# Patient Record
Sex: Male | Born: 1955 | Race: White | Hispanic: No | Marital: Married | Smoking: Former smoker
Health system: Southern US, Community
[De-identification: ages and names within clinical notes are randomized; demographics above are authoritative.]

## PROBLEM LIST (undated history)

## (undated) DIAGNOSIS — F329 Major depressive disorder, single episode, unspecified: Secondary | ICD-10-CM

## (undated) DIAGNOSIS — K922 Gastrointestinal hemorrhage, unspecified: Secondary | ICD-10-CM

## (undated) DIAGNOSIS — Z9289 Personal history of other medical treatment: Secondary | ICD-10-CM

## (undated) DIAGNOSIS — D649 Anemia, unspecified: Secondary | ICD-10-CM

## (undated) DIAGNOSIS — F32A Depression, unspecified: Secondary | ICD-10-CM

## (undated) DIAGNOSIS — E1340 Other specified diabetes mellitus with diabetic neuropathy, unspecified: Secondary | ICD-10-CM

## (undated) DIAGNOSIS — R0600 Dyspnea, unspecified: Secondary | ICD-10-CM

## (undated) DIAGNOSIS — I2510 Atherosclerotic heart disease of native coronary artery without angina pectoris: Secondary | ICD-10-CM

## (undated) DIAGNOSIS — M51369 Other intervertebral disc degeneration, lumbar region without mention of lumbar back pain or lower extremity pain: Secondary | ICD-10-CM

## (undated) DIAGNOSIS — I10 Essential (primary) hypertension: Secondary | ICD-10-CM

## (undated) DIAGNOSIS — Z860101 Personal history of adenomatous and serrated colon polyps: Secondary | ICD-10-CM

## (undated) DIAGNOSIS — K219 Gastro-esophageal reflux disease without esophagitis: Secondary | ICD-10-CM

## (undated) DIAGNOSIS — E782 Mixed hyperlipidemia: Secondary | ICD-10-CM

## (undated) DIAGNOSIS — E119 Type 2 diabetes mellitus without complications: Secondary | ICD-10-CM

## (undated) DIAGNOSIS — G4730 Sleep apnea, unspecified: Secondary | ICD-10-CM

## (undated) DIAGNOSIS — M48062 Spinal stenosis, lumbar region with neurogenic claudication: Secondary | ICD-10-CM

## (undated) DIAGNOSIS — M5136 Other intervertebral disc degeneration, lumbar region: Secondary | ICD-10-CM

## (undated) DIAGNOSIS — M1990 Unspecified osteoarthritis, unspecified site: Secondary | ICD-10-CM

## (undated) DIAGNOSIS — I493 Ventricular premature depolarization: Secondary | ICD-10-CM

## (undated) DIAGNOSIS — C801 Malignant (primary) neoplasm, unspecified: Secondary | ICD-10-CM

## (undated) DIAGNOSIS — F419 Anxiety disorder, unspecified: Secondary | ICD-10-CM

## (undated) HISTORY — PX: EYE SURGERY: SHX253

## (undated) HISTORY — PX: COLON SURGERY: SHX602

## (undated) HISTORY — PX: BILATERAL CARPAL TUNNEL RELEASE: SHX6508

## (undated) HISTORY — PX: BACK SURGERY: SHX140

---
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
## 1994-10-31 HISTORY — PX: CORONARY ARTERY BYPASS GRAFT: SHX141

## 1994-10-31 HISTORY — PX: BYPASS GRAFT: SHX909

## 1998-10-31 HISTORY — PX: OTHER SURGICAL HISTORY: SHX169

## 1999-03-31 ENCOUNTER — Encounter: Admission: RE | Admit: 1999-03-31 | Discharge: 1999-06-29 | Payer: Self-pay | Admitting: Oncology

## 2004-07-10 ENCOUNTER — Other Ambulatory Visit: Payer: Self-pay

## 2004-10-31 HISTORY — PX: OTHER SURGICAL HISTORY: SHX169

## 2004-10-31 HISTORY — PX: FRACTURE SURGERY: SHX138

## 2004-11-30 ENCOUNTER — Ambulatory Visit: Payer: Self-pay | Admitting: Gastroenterology

## 2006-09-27 ENCOUNTER — Ambulatory Visit: Payer: Self-pay | Admitting: Internal Medicine

## 2008-12-28 ENCOUNTER — Emergency Department: Payer: Self-pay | Admitting: Emergency Medicine

## 2010-02-13 ENCOUNTER — Emergency Department: Payer: Self-pay | Admitting: Emergency Medicine

## 2010-11-07 ENCOUNTER — Inpatient Hospital Stay: Payer: Self-pay | Admitting: Internal Medicine

## 2010-11-16 ENCOUNTER — Observation Stay: Payer: Self-pay | Admitting: Internal Medicine

## 2010-12-18 ENCOUNTER — Observation Stay: Payer: Self-pay

## 2012-10-31 HISTORY — PX: CORONARY ANGIOPLASTY WITH STENT PLACEMENT: SHX49

## 2013-03-29 ENCOUNTER — Inpatient Hospital Stay: Payer: Self-pay | Admitting: Internal Medicine

## 2013-03-29 LAB — PROTIME-INR: Prothrombin Time: 13.4 secs (ref 11.5–14.7)

## 2013-03-29 LAB — CBC
HCT: 40.6 % (ref 40.0–52.0)
MCH: 30.8 pg (ref 26.0–34.0)
MCHC: 35.3 g/dL (ref 32.0–36.0)
Platelet: 111 10*3/uL — ABNORMAL LOW (ref 150–440)
RBC: 4.65 10*6/uL (ref 4.40–5.90)
RDW: 13.1 % (ref 11.5–14.5)
WBC: 8.5 10*3/uL (ref 3.8–10.6)

## 2013-03-29 LAB — BASIC METABOLIC PANEL
Anion Gap: 5 — ABNORMAL LOW (ref 7–16)
Calcium, Total: 8.9 mg/dL (ref 8.5–10.1)
Chloride: 101 mmol/L (ref 98–107)
Creatinine: 0.78 mg/dL (ref 0.60–1.30)
EGFR (Non-African Amer.): >60
Osmolality: 272 (ref 275–301)

## 2013-03-29 LAB — APTT: Activated PTT: 34.9 secs (ref 23.6–35.9)

## 2013-03-29 LAB — CK TOTAL AND CKMB (NOT AT ARMC): CK-MB: 1.9 ng/mL (ref 0.5–3.6)

## 2013-03-30 LAB — TROPONIN I
Troponin-I: 1.2 ng/mL — ABNORMAL HIGH
Troponin-I: 1.24 ng/mL — ABNORMAL HIGH

## 2013-03-30 LAB — MAGNESIUM: Magnesium: 1.8 mg/dL

## 2013-03-30 LAB — LIPID PANEL
Cholesterol: 124 mg/dL (ref 0–200)
Ldl Cholesterol, Calc: 60 mg/dL (ref 0–100)
VLDL Cholesterol, Calc: 23 mg/dL (ref 5–40)

## 2013-03-30 LAB — CK TOTAL AND CKMB (NOT AT ARMC): CK, Total: 213 U/L (ref 35–232)

## 2013-03-30 LAB — APTT
Activated PTT: 146.1 secs — ABNORMAL HIGH (ref 23.6–35.9)
Activated PTT: 90.1 secs — ABNORMAL HIGH (ref 23.6–35.9)

## 2013-03-31 LAB — APTT: Activated PTT: 98.6 secs — ABNORMAL HIGH (ref 23.6–35.9)

## 2013-04-01 LAB — PLATELET COUNT: Platelet: 107 10*3/uL — ABNORMAL LOW (ref 150–440)

## 2013-04-02 LAB — BASIC METABOLIC PANEL
Anion Gap: 7 (ref 7–16)
BUN: 7 mg/dL (ref 7–18)
Calcium, Total: 8.9 mg/dL (ref 8.5–10.1)
Chloride: 104 mmol/L (ref 98–107)
EGFR (African American): >60
EGFR (Non-African Amer.): >60
Osmolality: 274 (ref 275–301)
Potassium: 3.6 mmol/L (ref 3.5–5.1)
Sodium: 138 mmol/L (ref 136–145)

## 2013-04-24 ENCOUNTER — Encounter: Payer: Self-pay | Admitting: Internal Medicine

## 2013-04-30 ENCOUNTER — Encounter: Payer: Self-pay | Admitting: Internal Medicine

## 2013-05-06 ENCOUNTER — Encounter: Payer: Self-pay | Admitting: Rheumatology

## 2013-05-31 ENCOUNTER — Encounter: Payer: Self-pay | Admitting: Internal Medicine

## 2013-07-01 ENCOUNTER — Encounter: Payer: Self-pay | Admitting: Internal Medicine

## 2014-01-27 ENCOUNTER — Ambulatory Visit: Payer: Self-pay | Admitting: Physical Medicine and Rehabilitation

## 2015-02-20 NOTE — Discharge Summary (Signed)
PATIENT NAME:  Kent [name], Kent [name] MR#:  [id number] DATE OF BIRTH:  02-27-1956  DATE OF ADMISSION:  03/29/2013 DATE OF DISCHARGE:    DISCHARGE DIAGNOSES:  1.  Non-ST elevated myocardial infarction.  2.  Diabetes mellitus, non-insulin-requiring.  3.  Hypertension.  4.  Lumbar disk disease.   DISCHARGE MEDICATIONS: Prednisone 5 mg every other day, simvastatin 40 mg at bedtime, multivitamin daily, metoprolol tartrate 50 mg half tab b.i.d., Xanax 0.5 mg 1/2 tablet t.i.d., aspirin 81 mg daily, ferrous sulfate 325 mg b.i.d., Victoza 1.8 mg daily, lisinopril 20 mg daily, metformin 500 mg b.i.d., omeprazole 20 mg daily, Paxil 20 mg daily and Percocet 5/325 mg half tablet at bedtime.   REASON FOR ADMISSION: A 59 year old male presents with chest pain. Please see H and P for HPI, past medical history and physical exam.   HOSPITAL COURSE: The patient was admitted. His troponins peaked at 1.3 with elevated MB fractions and a peak CK of 447. He became pain free with aspirin and morphine in the ED and then had subsequent pain about 18 hours later. He was pain free post PCI. Echocardiogram showed LVF of 45% to 50% with moderate concentric LVH and mild MR. heart catheterization showed mild LV systolic dysfunction with a 95% mid circumflex lesion that underwent PCI to 0% stenosis using a Xience drug-eluting stent, 3.5 x 18 mm. The patient was pain-free post procedure. His groin was normal without hematoma and will be discharged on the above medications. Follow up with Dr. Saralyn Pilar in 1 week and myself in 2 weeks. He will need cardiac rehab. He will be released back to work up by Dr. Saralyn Pilar in 1 week. Overall prognosis is good.  ____________________________ Rusty Aus, MD mfm:aw D: 04/02/2013 08:15:26 ET T: 04/02/2013 08:45:50 ET JOB#: [id number]  cc: Rusty Aus, MD, <Dictator> Rusty Aus MD ELECTRONICALLY SIGNED 04/03/2013 8:14

## 2015-02-20 NOTE — Consult Note (Signed)
PATIENT NAME:  Kent [name], Kent [name] MR#:  [id number] DATE OF BIRTH:  04-12-1956  DATE OF CONSULTATION:  03/29/2013  CONSULTING PHYSICIAN:  Isaias Cowman, MD  PRIMARY CARE PHYSICIAN: Dr. Sabra Heck  PRIMARY CARDIOLOGIST: Dr. Ubaldo Glassing  CHIEF COMPLAINT: Chest pain.   HISTORY OF PRESENT ILLNESS: The patient is a 59 year old gentleman referred for evaluation of chest pain, EKG changes and elevated troponin. The patient is status post coronary artery bypass graft surgery at [facility] in 1996 and underwent stent of his right coronary artery in 2012. The patient reports that he has been in his usual state of health until recently when he has noted some intermittent chest discomfort when mowing the yard. On the day of admission on 03/29/2013, the patient walked to his mailbox and on his return to the house experienced substernal chest pain. The patient took antacids and sublingual nitroglycerin without relief. He describes his chest pain as 6/10. The patient presented to [facility] Emergency Room, where EKG revealed nondiagnostic ST elevation in lead III with ST depression in the lateral leads. The patient was admitted to telemetry floor, where initial troponin was less than 0.02 with a followup troponin of 1.20. The patient reports he still has some mild chest discomfort rated 1 to 2/10.   PAST MEDICAL HISTORY: 1.  Coronary artery disease as described above, status post coronary artery bypass graft surgery x 1 in 1996 at [facility], with drug-eluting stent to mid right coronary artery in 12/2010.  2.  Hypertension.  3.  Obesity.  4.  History of anxiety and panic attacks.  5.  History of reflux esophagitis and stricture.   MEDICATIONS: Aspirin 81 mg daily, Plavix 75 mg q.a.m., lisinopril 10 mg daily, simvastatin 40 mg at bedtime, Xanax 0.25 mg 1/2 tablet t.i.d., iron tablets b.i.d., Victoza 1.8 subcutaneous injection daily, metformin 500 mg b.i.d., omeprazole 20 mg daily, Paxil 20 mg daily, Percocet  p.r.n., prednisone 5 mg every other day, Tylenol Arthritis 650 q.4 p.r.n., multivitamin 1 daily, ibuprofen 400 mg daily.   SOCIAL HISTORY: The patient is married. He is a Art gallery manager by occupation. He quit smoking in 1996 right before he underwent bypass graft surgery.   FAMILY HISTORY: No immediate family history of coronary disease or myocardial infarction.   REVIEW OF SYSTEMS:    CONSTITUTIONAL: No fever or chills.  EYES: No blurry vision.  EARS: No hearing loss.  RESPIRATORY: No shortness of breath.  CARDIOVASCULAR: Chest pain as described above.  GASTROINTESTINAL: No nausea, vomiting, diarrhea or constipation.  GENITOURINARY: No dysuria or hematuria.  ENDOCRINE: No polyuria or polydipsia.  INTEGUMENTARY: No rash.  ENDOCRINE: No polyuria or polydipsia.  MUSCULOSKELETAL: No arthralgias or myalgias.  NEUROLOGICAL: No focal muscle weakness or numbness.  PSYCHOLOGICAL: The patient does have a history of anxiety and panic disorder.   PHYSICAL EXAMINATION: VITAL SIGNS: Blood pressure 106/66, pulse 72, respirations 18, temperature 97.8, pulse oximetry 94%.  HEENT: Pupils equal and reactive to light and accommodation.  NECK: Supple without thyromegaly.  LUNGS: Clear.  HEART: Normal JVP. Normal PMI. Regular rate and rhythm. Normal S1 and S2. No gallop, murmur or rub.  ABDOMEN: Soft and nontender. Pulses were intact bilaterally.  MUSCULOSKELETAL: Normal muscle tone.  NEUROLOGIC: The patient is alert and oriented x 3. Motor and sensory both grossly intact.   IMPRESSION: This is a 59 year old gentleman with known coronary artery disease as described above who presents with chest pain. EKG initially showed nondiagnostic ST elevation in lead III with lateral ST depression, with  initial negative troponin. Followup troponin was borderline elevated. The patient still has some mild chest pain.   RECOMMENDATIONS: 1.  Agree with overall current therapy.  2.  Recheck EKG. 3.  Recheck troponin. 4.   Pending EKG and troponin results, may consider transfer today for early invasive cardiac evaluation with cardiac catheterization.  5.  A 2-D echocardiogram to evaluate left ventricular function.  6.  The patient will likely require cardiac catheterization during this hospitalization. The risks, benefits and alternatives were explained and informed written consent obtained.   ____________________________ Isaias Cowman, MD ap:jm D: 03/30/2013 09:36:40 ET T: 03/30/2013 15:02:04 ET JOB#: [id number]  cc: Isaias Cowman, MD, <Dictator> Isaias Cowman MD ELECTRONICALLY SIGNED 04/08/2013 8:48

## 2015-02-20 NOTE — H&P (Signed)
PATIENT NAME:  Kent [name], Kent [name] MR#:  [id number] DATE OF BIRTH:  01-Jan-1956  DATE OF ADMISSION:  03/29/2013  PRIMARY CARE PHYSICIAN:  Dr. Emily Filbert.   CARDIOLOGIST:  Dr. Ubaldo Glassing.   CHIEF COMPLAINT:  Chest pain today.   HISTORY OF PRESENT ILLNESS:  The patient Kent [name] is a 59 year old pleasant Caucasian gentleman with history of coronary artery disease status post CABG in 1996 and status post stent of his mid RCA with a drug-eluting stent in 2012, history of reflux esophagitis and chronic arthritis, comes to the Emergency Room with complaints of increasing chest pain, pressure, along with radiation to the jaw and right arm after he got back from work.  He did have some shortness of breath, comes to the Emergency Room, received aspirin and nitro paste.  He still rates his pain 5 over 10.  His first set of cardiac enzymes is negative, however his EKG shows some ST depression in lateral leads, which is new from EKG of July 2012.  The patient is being admitted for non-STEMI for further evaluation and management.   PAST MEDICAL HISTORY: 1.  Coronary artery disease status post CABG in 1996 x 1.  The patient underwent placement of drug-eluting stent to mid RCA in February of 2012.  2.  History of panic/anxiety attacks.  3.  Iron deficiency anemia.  4.  Reflux esophagitis with stricture.  5.  History of colon polyps.  6.  Hypertension.  7.  Morbid obesity.  8.  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   PAST SURGICAL HISTORY:  1.  Coronary artery disease August 1996.  2.  Right carpal tunnel surgery.  3.  Bilateral leg fractures, titanium rods placed in May 2006.   ALLERGIES:  CELEBREX, VIOXX AND ZOLOFT.   MEDICATIONS: 1.  Xanax 0.25 1/2 tablet 3 times a day.  2.  Aspirin 81 mg at bedtime.  3.  Iron tablets daily, twice daily.  4.  Victoza 1.8 mg subQ injection daily.  5.  Lisinopril 20 mg daily.  6.  Metformin 500 mg twice daily.  7.  Omeprazole 20 mg daily.  8.   Paxil 20 mg daily.  9.  Percocet 5/325 as needed.  10.  Plavix 75 mg daily in the morning.  11.  Prednisone 5 mg every other day.  12.  Simvastatin 40 mg daily at bedtime.  13.  Tylenol arthritis 650 1 tablet every 4 hours as needed.  14.  Multivitamin daily.  15.  Ibuprofen 400 mg by mouth daily.   SOCIAL HISTORY:  Heavy smoker prior to 66.  He is a Art gallery manager by occupation.   REVIEW OF SYSTEMS:   EARS, NOSE, THROAT:  No tinnitus, ear pain, hearing loss.  EYES:  No blurred, double vision or inflammation or glaucoma.  CONSTITUTIONAL:  No fever, fatigue, weakness.  RESPIRATORY:  No cough, wheeze, hemoptysis, COPD.  CARDIOVASCULAR:  Positive for chest pain, hypertension and mild shortness of breath.  GASTROINTESTINAL:  No nausea, vomiting, diarrhea, abdominal pain or GERD.  GENITOURINARY:  No dysuria, hematuria.  ENDOCRINE:  No polyuria, nocturia or thyroid problems.  HEMATOLOGY:  Positive for chronic anemia.  SKIN:  No acne or rash.  MUSCULOSKELETAL:  Positive for chronic arthritis.  NEUROLOGIC:  Positive for peripheral neuropathy bilateral lower extremity.  No numbness, weakness or CVA.  PSYCHIATRIC:  No anxiety or depression.   LABORATORY DATA:  CBC within normal limits except platelet count of 111, glucose is 111, BUN is 11, creatinine  0.78, sodium is 136, potassium 3.7.  Troponin is 0.02.  CK total and MB within normal limits.  PT INR is 13.4 and 1.0.  EKG shows a normal sinus rhythm with short PR interval.  Marked ST abnormality with possible lateral subendocardial injury.   ASSESSMENT AND PLAN:  A 59 year old [name] with history of coronary artery disease status post coronary artery bypass graft, hypertension, comes in with:  1.  Acute non-Q-wave myocardial infarction.  The patient came in with chest pain with radiation to jaw, right arm and ST-T depression in lateral leads which is a new change from July 2012 EKG, normal cardiac enzymes.  We will admit the patient to telemetry  floor.  Regular cardiac diet, IV heparin drip, per nomogram and nitro paste along with aspirin, beta-blockers, Plavix and ACE inhibitors.  Cardiology, Dr. Saralyn Pilar to see patient.  He is aware of patient being admitted.  We will cycle cardiac enzymes x 3, check lipid profile.   2.  Coronary artery disease, status post CABG in the past.  3.  Chronic, arthritis.  We will continue the patient's chronic prednisone.  4.  Hyperlipidemia, by mouth simvastatin will be continued.  5.  Deep vein thrombosis prophylaxis.  The patient on heparin drip.   Further work-up according to patient's clinical course.  Hospital admission plan was discussed with the patient.    The patient is a FULL CODE.   TIME SPENT:  60 minutes.     ____________________________ Hart Rochester Posey Pronto, MD sap:ea D: 03/29/2013 21:11:53 ET T: 03/29/2013 22:04:55 ET JOB#: [id number]  cc: Shervin Cypert A. Posey Pronto, MD, <Dictator> Rusty Aus, MD Javier Docker. Ubaldo Glassing, MD Ilda Basset MD ELECTRONICALLY SIGNED 04/04/2013 19:39

## 2015-02-25 ENCOUNTER — Ambulatory Visit
Admit: 2015-02-25 | Disposition: A | Discharge status: Home / Self Care | Payer: Self-pay | Attending: Physical Medicine and Rehabilitation | Admitting: Physical Medicine and Rehabilitation

## 2017-12-05 ENCOUNTER — Other Ambulatory Visit: Payer: Self-pay

## 2017-12-05 ENCOUNTER — Observation Stay
Admission: RE | Admit: 2017-12-05 | Discharge: 2017-12-06 | Disposition: A | Discharge status: Home / Self Care | Payer: BLUE CROSS/BLUE SHIELD | Source: Ambulatory Visit | Attending: Cardiology | Admitting: Cardiology

## 2017-12-05 ENCOUNTER — Encounter
Admission: RE | Disposition: A | Discharge status: Home / Self Care | Payer: Self-pay | Source: Ambulatory Visit | Attending: Cardiology

## 2017-12-05 DIAGNOSIS — Z955 Presence of coronary angioplasty implant and graft: Secondary | ICD-10-CM | POA: Insufficient documentation

## 2017-12-05 DIAGNOSIS — Z7984 Long term (current) use of oral hypoglycemic drugs: Secondary | ICD-10-CM | POA: Insufficient documentation

## 2017-12-05 DIAGNOSIS — E785 Hyperlipidemia, unspecified: Secondary | ICD-10-CM | POA: Insufficient documentation

## 2017-12-05 DIAGNOSIS — I10 Essential (primary) hypertension: Secondary | ICD-10-CM | POA: Diagnosis not present

## 2017-12-05 DIAGNOSIS — I25810 Atherosclerosis of coronary artery bypass graft(s) without angina pectoris: Secondary | ICD-10-CM | POA: Diagnosis not present

## 2017-12-05 DIAGNOSIS — I2582 Chronic total occlusion of coronary artery: Secondary | ICD-10-CM | POA: Diagnosis not present

## 2017-12-05 DIAGNOSIS — F419 Anxiety disorder, unspecified: Secondary | ICD-10-CM | POA: Insufficient documentation

## 2017-12-05 DIAGNOSIS — Z7982 Long term (current) use of aspirin: Secondary | ICD-10-CM | POA: Insufficient documentation

## 2017-12-05 DIAGNOSIS — E119 Type 2 diabetes mellitus without complications: Secondary | ICD-10-CM | POA: Insufficient documentation

## 2017-12-05 DIAGNOSIS — I2510 Atherosclerotic heart disease of native coronary artery without angina pectoris: Secondary | ICD-10-CM | POA: Diagnosis present

## 2017-12-05 DIAGNOSIS — R079 Chest pain, unspecified: Secondary | ICD-10-CM

## 2017-12-05 DIAGNOSIS — Z7902 Long term (current) use of antithrombotics/antiplatelets: Secondary | ICD-10-CM | POA: Diagnosis not present

## 2017-12-05 HISTORY — DX: Type 2 diabetes mellitus without complications: E11.9

## 2017-12-05 HISTORY — DX: Unspecified osteoarthritis, unspecified site: M19.90

## 2017-12-05 HISTORY — DX: Essential (primary) hypertension: I10

## 2017-12-05 HISTORY — PX: LEFT HEART CATH AND CORS/GRAFTS ANGIOGRAPHY: CATH118250

## 2017-12-05 HISTORY — DX: Other specified diabetes mellitus with diabetic neuropathy, unspecified: E13.40

## 2017-12-05 HISTORY — DX: Anemia, unspecified: D64.9

## 2017-12-05 HISTORY — PX: CORONARY STENT INTERVENTION: CATH118234

## 2017-12-05 HISTORY — DX: Atherosclerotic heart disease of native coronary artery without angina pectoris: I25.10

## 2017-12-05 LAB — GLUCOSE, CAPILLARY
GLUCOSE-CAPILLARY: 132 mg/dL — AB (ref 65–99)
GLUCOSE-CAPILLARY: 125 mg/dL — AB (ref 65–99)

## 2017-12-05 LAB — POCT ACTIVATED CLOTTING TIME: ACTIVATED CLOTTING TIME: 373 s

## 2017-12-05 SURGERY — LEFT HEART CATH AND CORS/GRAFTS ANGIOGRAPHY
Anesthesia: Moderate Sedation

## 2017-12-05 MED ORDER — HYDRALAZINE HCL 20 MG/ML IJ SOLN
INTRAMUSCULAR | Status: AC
  Filled 2017-12-05: qty 1

## 2017-12-05 MED ORDER — MIDAZOLAM HCL 2 MG/2ML IJ SOLN
INTRAMUSCULAR | Status: DC | PRN
Start: 2017-12-05 — End: 2017-12-05
  Administered 2017-12-05: 1 mg via INTRAVENOUS

## 2017-12-05 MED ORDER — SODIUM CHLORIDE 0.9% FLUSH: 3.0000 mL | INTRAVENOUS | Status: DC | PRN

## 2017-12-05 MED ORDER — TIROFIBAN HCL IN NACL 5-0.9 MG/100ML-% IV SOLN
INTRAVENOUS | Status: AC
  Filled 2017-12-05: qty 100

## 2017-12-05 MED ORDER — OXYMETAZOLINE HCL 0.05 % NA SOLN
1.0000 | Freq: Two times a day (BID) | NASAL | Status: DC | PRN
  Administered 2017-12-06: 1 via NASAL
  Filled 2017-12-05: qty 15

## 2017-12-05 MED ORDER — ASPIRIN 81 MG PO CHEW
CHEWABLE_TABLET | ORAL | Status: DC | PRN
Start: 2017-12-05 — End: 2017-12-05
  Administered 2017-12-05: 324 mg via ORAL

## 2017-12-05 MED ORDER — MIDAZOLAM HCL 2 MG/2ML IJ SOLN
INTRAMUSCULAR | Status: AC
  Filled 2017-12-05: qty 2

## 2017-12-05 MED ORDER — HYDROCOD POLST-CPM POLST ER 10-8 MG/5ML PO SUER
ORAL | Status: AC
  Filled 2017-12-05: qty 5

## 2017-12-05 MED ORDER — SODIUM CHLORIDE 0.9% FLUSH: 3.0000 mL | Freq: Two times a day (BID) | INTRAVENOUS | Status: DC

## 2017-12-05 MED ORDER — LABETALOL HCL 5 MG/ML IV SOLN: 10.0000 mg | INTRAVENOUS | Status: AC | PRN

## 2017-12-05 MED ORDER — HYDRALAZINE HCL 20 MG/ML IJ SOLN
5.0000 mg | INTRAMUSCULAR | Status: AC | PRN
  Administered 2017-12-05 (×2): 5 mg via INTRAVENOUS

## 2017-12-05 MED ORDER — TIROFIBAN HCL IV 12.5 MG/250 ML
INTRAVENOUS | Status: AC
  Filled 2017-12-05: qty 250

## 2017-12-05 MED ORDER — MAGNESIUM GLUCONATE 500 MG PO TABS
500.0000 mg | ORAL_TABLET | Freq: Every day | ORAL | Status: DC
  Filled 2017-12-05 (×2): qty 1

## 2017-12-05 MED ORDER — LISINOPRIL 20 MG PO TABS: 20.0000 mg | ORAL_TABLET | Freq: Every day | ORAL | Status: DC

## 2017-12-05 MED ORDER — BIVALIRUDIN BOLUS VIA INFUSION - CUPID
INTRAVENOUS | Status: DC | PRN
Start: 2017-12-05 — End: 2017-12-05
  Administered 2017-12-05: 83.025 mg via INTRAVENOUS

## 2017-12-05 MED ORDER — ISOSORBIDE MONONITRATE ER 30 MG PO TB24
30.0000 mg | ORAL_TABLET | Freq: Every day | ORAL | Status: DC
  Administered 2017-12-05: 30 mg via ORAL
  Filled 2017-12-05: qty 1

## 2017-12-05 MED ORDER — FENTANYL CITRATE (PF) 100 MCG/2ML IJ SOLN
INTRAMUSCULAR | Status: AC
  Filled 2017-12-05: qty 2

## 2017-12-05 MED ORDER — CLOPIDOGREL BISULFATE 75 MG PO TABS
ORAL_TABLET | ORAL | Status: DC | PRN
  Administered 2017-12-05: 225 mg via ORAL

## 2017-12-05 MED ORDER — SODIUM CHLORIDE 0.9 % IV SOLN: 250.0000 mL | INTRAVENOUS | Status: DC | PRN

## 2017-12-05 MED ORDER — NITROGLYCERIN 1 MG/10 ML FOR IR/CATH LAB
INTRA_ARTERIAL | Status: DC | PRN
  Administered 2017-12-05: 200 ug via INTRACORONARY

## 2017-12-05 MED ORDER — SODIUM CHLORIDE 0.9 % WEIGHT BASED INFUSION
3.0000 mL/kg/h | INTRAVENOUS | Status: DC
  Administered 2017-12-05: 07:00:00 via INTRAVENOUS

## 2017-12-05 MED ORDER — CLOPIDOGREL BISULFATE 75 MG PO TABS
ORAL_TABLET | ORAL | Status: AC
  Filled 2017-12-05: qty 3

## 2017-12-05 MED ORDER — SODIUM CHLORIDE 0.9 % IV SOLN
INTRAVENOUS | Status: AC | PRN
  Administered 2017-12-05 (×2): 1.75 mg/kg/h via INTRAVENOUS

## 2017-12-05 MED ORDER — CLOPIDOGREL BISULFATE 75 MG PO TABS
75.0000 mg | ORAL_TABLET | Freq: Every day | ORAL | Status: DC
  Administered 2017-12-06: 75 mg via ORAL
  Filled 2017-12-05: qty 1

## 2017-12-05 MED ORDER — HYDROCOD POLST-CPM POLST ER 10-8 MG/5ML PO SUER
5.0000 mL | Freq: Four times a day (QID) | ORAL | Status: DC | PRN
  Administered 2017-12-06: 5 mL via ORAL
  Filled 2017-12-05 (×2): qty 5

## 2017-12-05 MED ORDER — TIROFIBAN (AGGRASTAT) BOLUS VIA INFUSION
INTRAVENOUS | Status: DC | PRN
  Administered 2017-12-05: 2767.5 ug via INTRAVENOUS

## 2017-12-05 MED ORDER — SODIUM CHLORIDE 0.9 % WEIGHT BASED INFUSION
1.0000 mL/kg/h | INTRAVENOUS | Status: AC
  Administered 2017-12-05: 1 mL/kg/h via INTRAVENOUS

## 2017-12-05 MED ORDER — TIROFIBAN HCL IN NACL 5-0.9 MG/100ML-% IV SOLN
INTRAVENOUS | Status: AC | PRN
  Administered 2017-12-05: 0.15 ug/kg/min via INTRAVENOUS
  Administered 2017-12-05: 11:00:00 via INTRAVENOUS

## 2017-12-05 MED ORDER — CARVEDILOL 25 MG PO TABS
25.0000 mg | ORAL_TABLET | Freq: Two times a day (BID) | ORAL | Status: DC
  Administered 2017-12-05 – 2017-12-06 (×2): 25 mg via ORAL
  Filled 2017-12-05 (×2): qty 1

## 2017-12-05 MED ORDER — ASPIRIN 81 MG PO CHEW: 81.0000 mg | CHEWABLE_TABLET | Freq: Every day | ORAL | Status: DC

## 2017-12-05 MED ORDER — ACETAMINOPHEN 325 MG PO TABS: 650.0000 mg | ORAL_TABLET | ORAL | Status: DC | PRN

## 2017-12-05 MED ORDER — BIVALIRUDIN TRIFLUOROACETATE 250 MG IV SOLR
INTRAVENOUS | Status: AC
  Filled 2017-12-05: qty 250

## 2017-12-05 MED ORDER — HYDROCOD POLST-CPM POLST ER 10-8 MG/5ML PO SUER
ORAL | Status: DC | PRN
  Administered 2017-12-05: 5 mL via ORAL

## 2017-12-05 MED ORDER — HEPARIN (PORCINE) IN NACL 2-0.9 UNIT/ML-% IJ SOLN
INTRAMUSCULAR | Status: AC
  Filled 2017-12-05: qty 1000

## 2017-12-05 MED ORDER — PAROXETINE HCL 20 MG PO TABS
20.0000 mg | ORAL_TABLET | Freq: Every day | ORAL | Status: DC
  Filled 2017-12-05 (×2): qty 1

## 2017-12-05 MED ORDER — NITROGLYCERIN 5 MG/ML IV SOLN
INTRAVENOUS | Status: AC
  Filled 2017-12-05: qty 10

## 2017-12-05 MED ORDER — ONDANSETRON HCL 4 MG/2ML IJ SOLN: 4.0000 mg | Freq: Four times a day (QID) | INTRAMUSCULAR | Status: DC | PRN

## 2017-12-05 MED ORDER — TIROFIBAN HCL IN NACL 5-0.9 MG/100ML-% IV SOLN
0.1500 ug/kg/min | INTRAVENOUS | Status: AC
  Administered 2017-12-06: 0.15 ug/kg/min via INTRAVENOUS
  Filled 2017-12-05 (×4): qty 100

## 2017-12-05 MED ORDER — ASPIRIN 81 MG PO CHEW
CHEWABLE_TABLET | ORAL | Status: AC
  Filled 2017-12-05: qty 4

## 2017-12-05 MED ORDER — FERROUS SULFATE 325 (65 FE) MG PO TABS
325.0000 mg | ORAL_TABLET | Freq: Two times a day (BID) | ORAL | Status: DC
  Administered 2017-12-05 – 2017-12-06 (×2): 325 mg via ORAL
  Filled 2017-12-05 (×2): qty 1

## 2017-12-05 MED ORDER — ASPIRIN 81 MG PO CHEW: 81.0000 mg | CHEWABLE_TABLET | ORAL | Status: DC

## 2017-12-05 MED ORDER — ALUM & MAG HYDROXIDE-SIMETH 200-200-20 MG/5ML PO SUSP
30.0000 mL | Freq: Four times a day (QID) | ORAL | Status: DC | PRN
  Administered 2017-12-05: 30 mL via ORAL
  Filled 2017-12-05: qty 30

## 2017-12-05 MED ORDER — FENTANYL CITRATE (PF) 100 MCG/2ML IJ SOLN
INTRAMUSCULAR | Status: DC | PRN
  Administered 2017-12-05: 25 ug via INTRAVENOUS

## 2017-12-05 MED ORDER — SIMVASTATIN 20 MG PO TABS
40.0000 mg | ORAL_TABLET | Freq: Every day | ORAL | Status: DC
  Administered 2017-12-05: 40 mg via ORAL
  Filled 2017-12-05: qty 2

## 2017-12-05 MED ORDER — GABAPENTIN 300 MG PO CAPS
300.0000 mg | ORAL_CAPSULE | Freq: Two times a day (BID) | ORAL | Status: DC
  Administered 2017-12-05 (×2): 300 mg via ORAL
  Filled 2017-12-05 (×2): qty 1

## 2017-12-05 MED ORDER — HYDROCODONE-ACETAMINOPHEN 5-325 MG PO TABS
1.0000 | ORAL_TABLET | ORAL | Status: DC | PRN
  Administered 2017-12-05 – 2017-12-06 (×3): 1 via ORAL
  Filled 2017-12-05 (×3): qty 1

## 2017-12-05 MED ORDER — SODIUM CHLORIDE 0.9 % WEIGHT BASED INFUSION: 1.0000 mL/kg/h | INTRAVENOUS | Status: DC

## 2017-12-05 SURGICAL SUPPLY — 23 items
BALLN MINITREK RX 1.5X15 (BALLOONS) ×2
BALLN ~~LOC~~ TREK RX 2.5X12 (BALLOONS)
BALLOON MINITREK RX 1.5X15 (BALLOONS) ×1 IMPLANT
BALLOON ~~LOC~~ TREK RX 2.5X12 (BALLOONS) IMPLANT
CATH 5FR JR4 DIAGNOSTIC (CATHETERS) ×2 IMPLANT
CATH INFINITI 5 FR IM (CATHETERS) ×2 IMPLANT
CATH INFINITI 5FR ANG PIGTAIL (CATHETERS) ×2 IMPLANT
CATH INFINITI 5FR JL4 (CATHETERS) ×2 IMPLANT
CATH INFINITI 5FR JL5 (CATHETERS) ×2 IMPLANT
CATH VISTA GUIDE 6FR XB3.5 (CATHETERS) ×2 IMPLANT
DEVICE CLOSURE MYNXGRIP 6/7F (Vascular Products) ×2 IMPLANT
DEVICE INFLAT 30 PLUS (MISCELLANEOUS) ×2 IMPLANT
DEVICE SAFEGUARD 24CM (GAUZE/BANDAGES/DRESSINGS) ×2 IMPLANT
KIT MANI 3VAL PERCEP (MISCELLANEOUS) ×2 IMPLANT
NEEDLE PERC 18GX7CM (NEEDLE) ×2 IMPLANT
PACK CARDIAC CATH (CUSTOM PROCEDURE TRAY) ×2 IMPLANT
SHEATH AVANTI 5FR X 11CM (SHEATH) ×2 IMPLANT
SHEATH AVANTI 6FR X 11CM (SHEATH) ×2 IMPLANT
STENT SIERRA 2.25 X 18 MM (Permanent Stent) ×2 IMPLANT
STENT SIERRA 2.50 X 12 MM (Permanent Stent) ×2 IMPLANT
WIRE ASAHI PROWATER 180CM (WIRE) ×2 IMPLANT
WIRE EMERALD 3MM-J .035X260CM (WIRE) ×2 IMPLANT
WIRE GUIDERIGHT .035X150 (WIRE) ×2 IMPLANT

## 2017-12-05 NOTE — Progress Notes (Signed)
Pt admitted to unit s/p cardiac catherization with stent placement. Pt has no complaints of pain, VSS, SR with PVCs on telemetry. PAD with 40cc of air is intact. Some oozing noted at right groin site. RN will leave air in PAD for now and continue to assess.  Aggrenox infusing at 19.9

## 2017-12-05 NOTE — Progress Notes (Signed)
In and out cath performed secondary to pt. Unable to urinate laying down. Pt. Tolerated well. Noted scant amt. Of blood around meatus upon Hunt cath. Encouraged pt. To alert RN if any blood or clots noted upon urinating per self later on today. Pt. Verbalized understanding. Right groin remains clean, dry, intact with PAD on.

## 2017-12-05 NOTE — Progress Notes (Signed)
MD notified for order for aggrastat. No orders are entered. MD gives order for aggrastat and is made aware of patient having pink tinged urine. I will continue to assess.

## 2017-12-05 NOTE — Progress Notes (Signed)
Doctor maier ordered Afrin PRN twice a day for congestion. Will continue to monitor

## 2017-12-05 NOTE — Progress Notes (Signed)
Pt attempts to use urinal. Two small blood clots are expelled. Pt proceeds to urinate without any difficulty. No more clots are present and the urine is clear staw colored. I will continue to assess.

## 2017-12-05 NOTE — Progress Notes (Signed)
Pt complains of heartburn/indigestion. Standing order for prn maalox will be entered. I will continue to assess.

## 2017-12-05 NOTE — Progress Notes (Signed)
Pt complains of congestion. Doctor maier was notified awaiting call back. Will continue to monitor.

## 2017-12-05 NOTE — Progress Notes (Signed)
Pt alerts RN of chronic neck and back pain which is treated with prn hydrocodone at home. MD notified. Orders received. I will continue to assess.

## 2017-12-05 NOTE — Plan of Care (Signed)
  Progressing Education: Knowledge of General Education information will improve 12/05/2017 2346 - Progressing by Liliane Channel, RN Clinical Measurements: Cardiovascular complication will be avoided 12/05/2017 2346 - Progressing by Liliane Channel, RN Pain Managment: General experience of comfort will improve 12/05/2017 2346 - Progressing by Liliane Channel, RN Safety: Ability to remain free from injury will improve 12/05/2017 2346 - Progressing by Liliane Channel, RN

## 2017-12-06 ENCOUNTER — Encounter: Payer: Self-pay | Admitting: Cardiology

## 2017-12-06 DIAGNOSIS — I25810 Atherosclerosis of coronary artery bypass graft(s) without angina pectoris: Secondary | ICD-10-CM | POA: Diagnosis not present

## 2017-12-06 LAB — CBC
HEMATOCRIT: 40.2 % (ref 40.0–52.0)
HEMOGLOBIN: 13.7 g/dL (ref 13.0–18.0)
MCH: 30.5 pg (ref 26.0–34.0)
MCHC: 34.1 g/dL (ref 32.0–36.0)
MCV: 89.3 fL (ref 80.0–100.0)
Platelets: 93 10*3/uL — ABNORMAL LOW (ref 150–440)
RBC: 4.5 MIL/uL (ref 4.40–5.90)
RDW: 13.3 % (ref 11.5–14.5)
WBC: 7.9 10*3/uL (ref 3.8–10.6)

## 2017-12-06 LAB — BASIC METABOLIC PANEL
ANION GAP: 8 (ref 5–15)
BUN: 15 mg/dL (ref 6–20)
CO2: 29 mmol/L (ref 22–32)
Calcium: 9.4 mg/dL (ref 8.9–10.3)
Chloride: 103 mmol/L (ref 101–111)
Creatinine, Ser: 0.92 mg/dL (ref 0.61–1.24)
GFR calc Af Amer: >60 mL/min (ref >60)
GLUCOSE: 142 mg/dL — AB (ref 65–99)
POTASSIUM: 4.6 mmol/L (ref 3.5–5.1)
Sodium: 140 mmol/L (ref 135–145)

## 2017-12-06 MED ORDER — ASPIRIN EC 81 MG PO TBEC: 81.0000 mg | DELAYED_RELEASE_TABLET | Freq: Once | ORAL | 0 refills | Status: AC

## 2017-12-06 NOTE — Progress Notes (Signed)
62 year old with known history of CAD, S/P CABG with LIMA to LAD in 1996 and with previous stents, HTN, HLD, and DM II, with c/o exertional chest pain.  Patient had positive outpatient stress test.  Patient brought in for Cardiac Cath with selective coronary arteriography on 12/05/2017.  Cardiac Cath revealed three-vessel CAD with 100% ostial LAD stenosis, 99% stenosis OM1, and patient stent mid RCA, and normal LV function.  The patient underwent PCI with overlapping stents to OM1.    Rounded on patient.  Patient setting up in bed.  Wife at bedside.  Risk factors of heart disease reviewed with patient. Explained the importance of controlling BP, cholesterol, and blood sugar; maintaining a healthy weight; smoking cessation - if applicable, and exercise.  Informed patient the nurse will provide him with the stent card and instructed him to keep with him at all times. Patient has had previous stents.   ? Smoking cessation - patient is a former smoker.    Reviewed cardiac medications, rationale for taking, and side effects.  Discussed emergency plan.   Discussed the importance of following a low sodium, low fat, low cholesterol/carb modified heart healthy diet.? ?? Exercise and Cardiac Rehab discussed.  Patient reported that he participated in Cardiac Rehab approximately four years ago.  Explained to patient when one has a PCI one is referred to Cardiac Rehab.  Patient reported to this RN he walks a lot; is not sedentary; and is unable to participate in Cardiac Rehab due to his work schedule.  Spent several minutes talking about risk factor modifications.  Discussed walking plan with gradual increase to 30 minutes per day for a total of 150 minutes per week. ?  Patient and wife thanked me for my time and the above information.??   Roanna Epley, RN, BSN, [facility] Cardiovascular and Pulmonary Nurse Navigator Roanna Epley, RN, BSN, [place]  [facility] Cardiac & Pulmonary Rehab  Cardiovascular & Pulmonary  Nurse Navigator  Direct Line: [phone]  Department Phone #: [phone] Fax: [phone]  Email Address: [email or web address]

## 2017-12-06 NOTE — Progress Notes (Signed)
Pt EKG resulted. Page prime. Will continue ro monitor.

## 2017-12-06 NOTE — Progress Notes (Signed)
Pt has a PRN Tussionex scheduled 4 times daily PRN for cough. Pt was complaining of cough around 6:36am and as I about to scan the medicines a warning pops-up. Pharmacy was called and talked to [facility]. He states that Tussionex is only given twice a day PRN. Incoming shift (Tammy) was notified about the matter. Will continue to monitor.

## 2017-12-06 NOTE — Discharge Summary (Signed)
Physician Discharge Summary  Patient ID: Kent [name] MRN: [id number] DOB/AGE: 1956-06-10 62 y.o.  Admit date: 12/05/2017 Discharge date: 12/06/2017  Primary Discharge Diagnosis Coronary artery disease, chest pain Secondary Discharge Diagnosis same  Significant Diagnostic Studies: Cardiac catheterization with selective coronary arteriography revealed 99% stenosis of OM1  Consults: cardiology  Hospital Course: 62 year old male with a known history of coronary artery disease, status post CABG with LIMA to LAD, 1996, and previous stents, essential hypertension, hyperlipidemia, and type 2 diabetes, with complaints of exertional chest pain, relieved with rest.  Recent Lexiscan Myoview revealed mild inferior and moderate lateral wall ischemia.  The patient proceeded with cardiac catheterization with selective coronary arteriography, which was performed on 12/05/2017.  Cardiac catheterization revealed three-vessel coronary artery disease, with 100% ostial LAD stenosis, 99% stenosis OM1, and patent stent mid RCA, and normal left ventricular function.  The patient successfully underwent PCI with overlapping 2.25 x 18 mm and 2.5 x 12 mm Xience Sierra stents to OM1.  This morning the patient reports feeling "wonderful."  He denies recurrent chest pain or shortness of breath.   Discharge Exam: Blood pressure (!) 171/90, pulse 61, temperature 98.4 F (36.9 C), temperature source Oral, resp. rate 18, height 5\' 11"  (1.803 m), weight 111 kg (244 lb 11.2 oz), SpO2 98 %.  General appearance: alert, cooperative, appears stated age and no distress Head: Normocephalic, without obvious abnormality, atraumatic Eyes: negative Resp: Normal effort of breathing on room air, no wheezing Cardio: regular rate and rhythm, S1, S2 normal, no murmur, click, rub or gallop Extremities: extremities normal, atraumatic, no cyanosis or edema Skin: Warm, dry, no diaphoresis Neurologic: Grossly normal Labs:   Lab Results   Component Value Date   WBC 7.9 12/06/2017   HGB 13.7 12/06/2017   HCT 40.2 12/06/2017   MCV 89.3 12/06/2017   PLT 93 (L) 12/06/2017    Recent Labs  Lab 12/06/17 0426  NA 140  K 4.6  CL 103  CO2 29  BUN 15  CREATININE 0.92  CALCIUM 9.4  GLUCOSE 142*    EKG: Sinus rhythm, 59 bpm  FOLLOW UP PLANS AND APPOINTMENTS Discharge Instructions    Amb Referral to Cardiac Rehabilitation   Complete by:  As directed    Diagnosis:  Coronary Stents     Allergies as of 12/06/2017      Reactions   Celebrex [celecoxib] Other (See Comments)   GI bleed   Oxycodone Other (See Comments)   Hallucinations and agitations   Prednisone Other (See Comments)   Can't take high doses   Zoloft [sertraline Hcl] Other (See Comments)   Crazy      Medication List    TAKE these medications   ALPRAZolam 0.25 MG tablet Commonly known as:  XANAX Take 0.125 mg by mouth 3 (three) times daily as needed for anxiety.   aspirin EC 81 MG tablet Take 1 tablet (81 mg total) by mouth once for 1 dose. What changed:    medication strength  how much to take  when to take this   carvedilol 25 MG tablet Commonly known as:  COREG Take 25 mg by mouth 4 (four) times daily.   clopidogrel 75 MG tablet Commonly known as:  PLAVIX Take 75 mg by mouth daily.   ferrous sulfate 325 (65 FE) MG tablet Take 325 mg by mouth 2 (two) times daily with a meal.   gabapentin 300 MG capsule Commonly known as:  NEURONTIN Take 300 mg by mouth See admin instructions.  Take 300 mg by mouth at supper and take 300 mg by mouth at bedtime   HYDROcodone-acetaminophen 7.5-325 MG tablet Commonly known as:  NORCO Take 0.5 tablets by mouth 3 (three) times daily as needed for moderate pain.   ibuprofen 200 MG tablet Commonly known as:  ADVIL,MOTRIN Take 200-400 mg by mouth See admin instructions. Take 400 mg by mouth in the morning, take 200 mg by mouth at lunch and take 200 mg by mouth at bedtime   isosorbide mononitrate 30 MG  24 hr tablet Commonly known as:  IMDUR Take 30 mg by mouth daily.   lisinopril 20 MG tablet Commonly known as:  PRINIVIL,ZESTRIL Take 20 mg by mouth daily.   Magnesium 500 MG Tabs Take 500 mg by mouth daily.   Melatonin 10 MG Caps Take 10 mg by mouth at bedtime.   metFORMIN 500 MG tablet Commonly known as:  GLUCOPHAGE Take 500 mg by mouth 3 (three) times daily.   nitroGLYCERIN 0.4 MG SL tablet Commonly known as:  NITROSTAT Place 0.4 mg under the tongue every 5 (five) minutes as needed for chest pain.   omeprazole 20 MG capsule Commonly known as:  PRILOSEC Take 20 mg by mouth daily.   PARoxetine 20 MG tablet Commonly known as:  PAXIL Take 20 mg by mouth daily.   simvastatin 40 MG tablet Commonly known as:  ZOCOR Take 40 mg by mouth at bedtime.      Follow-up Information    Paraschos, Alexander, MD. Go in 1 week(s).   Specialty:  Cardiology Contact information: [facility] [place] [id number] [phone]           BRING ALL MEDICATIONS WITH YOU TO FOLLOW UP APPOINTMENTS  Time spent with patient to include physician time: 20 minutes Signed:  Clabe Seal PA-C 12/06/2017, 8:38 AM

## 2017-12-06 NOTE — Progress Notes (Signed)
Pt refused Bed alarm but was educated about safety. Will continue to monitor.

## 2017-12-06 NOTE — Care Management (Signed)
.  Is not being discharged home on cost prohibitive antiplatelet medication 

## 2017-12-06 NOTE — Progress Notes (Signed)
PAD deflated on previous shift and removed by NS with RN supervision. Gauze and tegaderm applied. No hematoma or bleeding noted. Pt given site care instructions by RN and given hand out on same. I will continue to assess.

## 2018-04-02 ENCOUNTER — Other Ambulatory Visit: Payer: Self-pay | Admitting: Physical Medicine and Rehabilitation

## 2018-04-02 DIAGNOSIS — M5030 Other cervical disc degeneration, unspecified cervical region: Secondary | ICD-10-CM

## 2018-04-02 DIAGNOSIS — M5412 Radiculopathy, cervical region: Secondary | ICD-10-CM

## 2018-04-17 ENCOUNTER — Ambulatory Visit
Admission: RE | Admit: 2018-04-17 | Discharge: 2018-04-17 | Disposition: A | Discharge status: Home / Self Care | Payer: BLUE CROSS/BLUE SHIELD | Source: Ambulatory Visit | Attending: Physical Medicine and Rehabilitation | Admitting: Physical Medicine and Rehabilitation

## 2018-04-17 DIAGNOSIS — M4802 Spinal stenosis, cervical region: Secondary | ICD-10-CM | POA: Diagnosis present

## 2018-04-17 DIAGNOSIS — M5021 Other cervical disc displacement,  high cervical region: Secondary | ICD-10-CM | POA: Diagnosis not present

## 2018-04-17 DIAGNOSIS — M5030 Other cervical disc degeneration, unspecified cervical region: Secondary | ICD-10-CM | POA: Insufficient documentation

## 2018-04-17 DIAGNOSIS — M5412 Radiculopathy, cervical region: Secondary | ICD-10-CM | POA: Insufficient documentation

## 2018-05-30 ENCOUNTER — Other Ambulatory Visit: Payer: Self-pay | Admitting: Nurse Practitioner

## 2018-05-30 DIAGNOSIS — K219 Gastro-esophageal reflux disease without esophagitis: Secondary | ICD-10-CM

## 2018-05-30 DIAGNOSIS — R1310 Dysphagia, unspecified: Secondary | ICD-10-CM

## 2018-06-05 ENCOUNTER — Ambulatory Visit
Admission: RE | Admit: 2018-06-05 | Discharge: 2018-06-05 | Disposition: A | Discharge status: Home / Self Care | Payer: BLUE CROSS/BLUE SHIELD | Source: Ambulatory Visit | Attending: Nurse Practitioner | Admitting: Nurse Practitioner

## 2018-06-05 DIAGNOSIS — R1310 Dysphagia, unspecified: Secondary | ICD-10-CM | POA: Insufficient documentation

## 2018-06-05 DIAGNOSIS — K219 Gastro-esophageal reflux disease without esophagitis: Secondary | ICD-10-CM

## 2018-09-29 ENCOUNTER — Encounter: Payer: Self-pay | Admitting: Emergency Medicine

## 2018-09-29 ENCOUNTER — Other Ambulatory Visit: Payer: Self-pay

## 2018-09-29 ENCOUNTER — Emergency Department: Payer: BLUE CROSS/BLUE SHIELD

## 2018-09-29 ENCOUNTER — Emergency Department
Admission: EM | Admit: 2018-09-29 | Discharge: 2018-09-29 | Disposition: A | Discharge status: Home / Self Care | Payer: BLUE CROSS/BLUE SHIELD | Attending: Emergency Medicine | Admitting: Emergency Medicine

## 2018-09-29 DIAGNOSIS — Z79899 Other long term (current) drug therapy: Secondary | ICD-10-CM | POA: Diagnosis not present

## 2018-09-29 DIAGNOSIS — Z87891 Personal history of nicotine dependence: Secondary | ICD-10-CM | POA: Insufficient documentation

## 2018-09-29 DIAGNOSIS — R0789 Other chest pain: Secondary | ICD-10-CM | POA: Diagnosis not present

## 2018-09-29 DIAGNOSIS — E1140 Type 2 diabetes mellitus with diabetic neuropathy, unspecified: Secondary | ICD-10-CM | POA: Diagnosis not present

## 2018-09-29 DIAGNOSIS — Z7902 Long term (current) use of antithrombotics/antiplatelets: Secondary | ICD-10-CM | POA: Insufficient documentation

## 2018-09-29 DIAGNOSIS — Z7984 Long term (current) use of oral hypoglycemic drugs: Secondary | ICD-10-CM | POA: Diagnosis not present

## 2018-09-29 DIAGNOSIS — I2510 Atherosclerotic heart disease of native coronary artery without angina pectoris: Secondary | ICD-10-CM | POA: Insufficient documentation

## 2018-09-29 DIAGNOSIS — R002 Palpitations: Secondary | ICD-10-CM | POA: Diagnosis present

## 2018-09-29 DIAGNOSIS — Z955 Presence of coronary angioplasty implant and graft: Secondary | ICD-10-CM | POA: Insufficient documentation

## 2018-09-29 DIAGNOSIS — Z951 Presence of aortocoronary bypass graft: Secondary | ICD-10-CM | POA: Diagnosis not present

## 2018-09-29 DIAGNOSIS — I10 Essential (primary) hypertension: Secondary | ICD-10-CM | POA: Insufficient documentation

## 2018-09-29 LAB — BASIC METABOLIC PANEL
Anion gap: 14 (ref 5–15)
BUN: 12 mg/dL (ref 8–23)
CO2: 26 mmol/L (ref 22–32)
CREATININE: 0.76 mg/dL (ref 0.61–1.24)
Calcium: 9.5 mg/dL (ref 8.9–10.3)
Chloride: 102 mmol/L (ref 98–111)
GFR calc Af Amer: >60 mL/min (ref >60)
GFR calc non Af Amer: >60 mL/min (ref >60)
Glucose, Bld: 90 mg/dL (ref 70–99)
POTASSIUM: 3.9 mmol/L (ref 3.5–5.1)
Sodium: 142 mmol/L (ref 135–145)

## 2018-09-29 LAB — CBC
HEMATOCRIT: 43 % (ref 39.0–52.0)
HEMOGLOBIN: 14.5 g/dL (ref 13.0–17.0)
MCH: 30.8 pg (ref 26.0–34.0)
MCHC: 33.7 g/dL (ref 30.0–36.0)
MCV: 91.3 fL (ref 80.0–100.0)
NRBC: 0 % (ref 0.0–0.2)
Platelets: 125 10*3/uL — ABNORMAL LOW (ref 150–400)
RBC: 4.71 MIL/uL (ref 4.22–5.81)
RDW: 12.9 % (ref 11.5–15.5)
WBC: 7 10*3/uL (ref 4.0–10.5)

## 2018-09-29 LAB — TROPONIN I
TROPONIN I: 0.06 ng/mL — AB (ref <0.03)
Troponin I: 0.04 ng/mL (ref <0.03)

## 2018-09-29 MED ORDER — NITROGLYCERIN 0.4 MG SL SUBL: 0.4000 mg | SUBLINGUAL_TABLET | SUBLINGUAL | 3 refills | Status: AC | PRN

## 2018-09-29 NOTE — Discharge Instructions (Addendum)
Please seek medical attention for any high fevers, chest pain, shortness of breath, change in behavior, persistent vomiting, bloody stool or any other new or concerning symptoms.  

## 2018-09-29 NOTE — ED Triage Notes (Signed)
Pt to ED via POV stating that he has been having increased palpitations. Pt states that palpitations were worse last night and he was also having some chest tightness with palpitations. Pt denies pain at this time. Pt is in NAD.

## 2018-09-29 NOTE — ED Provider Notes (Signed)
[facility] Emergency Department Provider Note  ____________________________________________   I have reviewed the triage vital signs and the nursing notes.   HISTORY  Chief Complaint Palpitations   History limited by: Not Limited   HPI Kent [name] is a 62 y.o. male who presents to the emergency department today because of concern for episode of chest pressure, high blood pressure and sensation of heart skipping beats. This occurred last night. The patient states that he woke up from sleep with the sensation that his heart was racing and skipping beats. He checked his blood pressure on his home monitor and it was elevated. This was accompanied by chest pressure. He denies any pain in his arm. He took aspirin and nitroglycerin and then the sensation went away after a couple of hours. Since waking up he has not had any more chest pressure. He denies any other recent illness.   Per medical record review patient has a history of CAD  Past Medical History:  Diagnosis Date  . Anemia   . Cardiovascular disease   . Diabetes mellitus ([place])    Type II  . Hypertension   . Neuropathy due to secondary diabetes mellitus ([place])   . Osteoarthritis     Patient Active Problem List   Diagnosis Date Noted  . CAD (coronary artery disease) 12/05/2017    Past Surgical History:  Procedure Laterality Date  . BILATERAL CARPAL TUNNEL RELEASE     both right and left done two different years  . bilateral leg surgery  2006   Both legs crushed, total of 6 surgeries at [facility] in 2006  . BYPASS GRAFT  1996   cardiac bypass done in 1996  . colorectal surgery  2000   For colorectal cancer, done by DR. ely and Dr. Sharlet Salina  . CORONARY ANGIOPLASTY WITH STENT PLACEMENT  2014  . CORONARY STENT INTERVENTION N/A 12/05/2017   Procedure: CORONARY STENT INTERVENTION;  Surgeon: Isaias Cowman, MD;  Location: [place] [facility];  Service: Cardiovascular;  Laterality: N/A;   . LEFT HEART CATH AND CORS/GRAFTS ANGIOGRAPHY N/A 12/05/2017   Procedure: LEFT HEART CATH AND CORS/GRAFTS ANGIOGRAPHY;  Surgeon: Isaias Cowman, MD;  Location: [place] [facility];  Service: Cardiovascular;  Laterality: N/A;    Prior to Admission medications   Medication Sig Start Date End Date Taking? Authorizing Provider  ALPRAZolam (XANAX) 0.25 MG tablet Take 0.125 mg by mouth 3 (three) times daily as needed for anxiety.    [provider]  carvedilol (COREG) 25 MG tablet Take 25 mg by mouth 4 (four) times daily.    [provider]  clopidogrel (PLAVIX) 75 MG tablet Take 75 mg by mouth daily.    [provider]  ferrous sulfate 325 (65 FE) MG tablet Take 325 mg by mouth 2 (two) times daily with a meal.    [provider]  gabapentin (NEURONTIN) 300 MG capsule Take 300 mg by mouth See admin instructions. Take 300 mg by mouth at supper and take 300 mg by mouth at bedtime    [provider]  HYDROcodone-acetaminophen (NORCO) 7.5-325 MG tablet Take 0.5 tablets by mouth 3 (three) times daily as needed for moderate pain.    [provider]  ibuprofen (ADVIL,MOTRIN) 200 MG tablet Take 200-400 mg by mouth See admin instructions. Take 400 mg by mouth in the morning, take 200 mg by mouth at lunch and take 200 mg by mouth at bedtime    [provider]  isosorbide  mononitrate (IMDUR) 30 MG 24 hr tablet Take 30 mg by mouth daily.    [provider]  lisinopril (PRINIVIL,ZESTRIL) 20 MG tablet Take 20 mg by mouth daily.    [provider]  Magnesium 500 MG TABS Take 500 mg by mouth daily.    [provider]  Melatonin 10 MG CAPS Take 10 mg by mouth at bedtime.    [provider]  metFORMIN (GLUCOPHAGE) 500 MG tablet Take 500 mg by mouth 3 (three) times daily.    [provider]  nitroGLYCERIN (NITROSTAT) 0.4 MG SL tablet Place 0.4 mg under the tongue every 5 (five) minutes as needed for chest  pain.    [provider]  omeprazole (PRILOSEC) 20 MG capsule Take 20 mg by mouth daily.    [provider]  PARoxetine (PAXIL) 20 MG tablet Take 20 mg by mouth daily.    [provider]  simvastatin (ZOCOR) 40 MG tablet Take 40 mg by mouth at bedtime.    [provider]    Allergies Celebrex [celecoxib]; Oxycodone; Prednisone; and Zoloft [sertraline hcl]  Family History  Problem Relation Age of Onset  . Cancer Mother   . Heart disease Mother   . Heart disease Father        Wagner's Disease  . Cancer Father   . Fibromyalgia Sister   . Heart disease Brother   . Heart disease Brother     Social History Social History   Tobacco Use  . Smoking status: Former Research scientist (life sciences)  . Smokeless tobacco: Never Used  Substance Use Topics  . Alcohol use: No    Frequency: Never  . Drug use: No    Review of Systems Constitutional: No fever/chills Eyes: No visual changes. ENT: No sore throat. Cardiovascular: Positive for chest pressure, feeling like his heart was racing Respiratory: Denies shortness of breath. Gastrointestinal: No abdominal pain.  No nausea, no vomiting.  No diarrhea.   Genitourinary: Negative for dysuria. Musculoskeletal: Positive for chronic back and neck pain.  Skin: Negative for rash. Neurological: Negative for headaches, focal weakness or numbness.  ____________________________________________   PHYSICAL EXAM:  VITAL SIGNS: ED Triage Vitals  Enc Vitals Group     BP 09/29/18 1306 (!) 148/89     Pulse Rate 09/29/18 1306 78     Resp 09/29/18 1306 16     Temp 09/29/18 1306 98.3 F (36.8 C)     Temp Source 09/29/18 1306 Oral     SpO2 09/29/18 1306 99 %     Weight 09/29/18 1307 231 lb (104.8 kg)     Height 09/29/18 1307 5\' 11"  (1.803 m)     Head Circumference --      Peak Flow --      Pain Score 09/29/18 1307 0   Constitutional: Alert and oriented.  Eyes: Conjunctivae are normal.  ENT      Head: Normocephalic and  atraumatic.      Nose: No congestion/rhinnorhea.      Mouth/Throat: Mucous membranes are moist.      Neck: No stridor. Hematological/Lymphatic/Immunilogical: No cervical lymphadenopathy. Cardiovascular: Normal rate, regular rhythm.  No murmurs, rubs, or gallops.  Respiratory: Normal respiratory effort without tachypnea nor retractions. Breath sounds are clear and equal bilaterally. No wheezes/rales/rhonchi. Gastrointestinal: Soft and non tender. No rebound. No guarding.  Genitourinary: Deferred Musculoskeletal: Normal range of motion in all extremities. No lower extremity edema. Neurologic:  Normal speech and language. No gross focal neurologic deficits are appreciated.  Skin:  Skin  is warm, dry and intact. No rash noted. Psychiatric: Mood and affect are normal. Speech and behavior are normal. Patient exhibits appropriate insight and judgment.  ____________________________________________    LABS (pertinent positives/negatives)  BMP wnl CBC wbc 7.0, hgb 14.5, plt 125 Trop 0.04->0.06  ____________________________________________   EKG  I, Nance Pear, attending physician, personally viewed and interpreted this EKG  EKG Time: 1303 Rate: 74 Rhythm: sinus rhythm with pvc Axis: normal Intervals: qtc 399 QRS: incomplete RBBB, q waves III, aVF ST changes: no st elevation Impression: abnormal ekg   ____________________________________________    RADIOLOGY  CXR No acute disease   ____________________________________________   PROCEDURES  Procedures  ____________________________________________   INITIAL IMPRESSION / ASSESSMENT AND PLAN / ED COURSE  Pertinent labs & imaging results that were available during my care of the patient were reviewed by me and considered in my medical decision making (see chart for details).   Patient presents to the emergency department because of concern for palpitations, chest pressure. At the time of the exam and throughout his  time in the ER he did not have any further symptoms. Initial blood work had troponin of 0.04. No recent old to compare to. Discussed this finding with the patient. Did repeat and it raised to 0.06. Discussed this with the patient. Could not tell him that his episode last night was not a heart attack. Did discuss and recommend admission. However patient felt comfortable going home. Discussed importance of quick return for any further symptoms and contacting primary care and cardiologist Monday morning.    ____________________________________________   FINAL CLINICAL IMPRESSION(S) / ED DIAGNOSES  Final diagnoses:  Heart palpitations  Chest pressure     Note: This dictation was prepared with Dragon dictation. Any transcriptional errors that result from this process are unintentional     Nance Pear, MD 09/29/18 1717

## 2019-04-01 ENCOUNTER — Inpatient Hospital Stay
Admission: EM | Admit: 2019-04-01 | Discharge: 2019-04-03 | DRG: 247 | Disposition: A | Discharge status: Home / Self Care | Payer: Medicare Other | Attending: Internal Medicine | Admitting: Internal Medicine

## 2019-04-01 ENCOUNTER — Encounter: Payer: Self-pay | Admitting: Emergency Medicine

## 2019-04-01 ENCOUNTER — Emergency Department: Payer: Medicare Other

## 2019-04-01 DIAGNOSIS — I2510 Atherosclerotic heart disease of native coronary artery without angina pectoris: Secondary | ICD-10-CM | POA: Diagnosis present

## 2019-04-01 DIAGNOSIS — Z955 Presence of coronary angioplasty implant and graft: Secondary | ICD-10-CM

## 2019-04-01 DIAGNOSIS — I10 Essential (primary) hypertension: Secondary | ICD-10-CM | POA: Diagnosis present

## 2019-04-01 DIAGNOSIS — Z1159 Encounter for screening for other viral diseases: Secondary | ICD-10-CM | POA: Diagnosis not present

## 2019-04-01 DIAGNOSIS — Z8249 Family history of ischemic heart disease and other diseases of the circulatory system: Secondary | ICD-10-CM

## 2019-04-01 DIAGNOSIS — Z87891 Personal history of nicotine dependence: Secondary | ICD-10-CM

## 2019-04-01 DIAGNOSIS — I214 Non-ST elevation (NSTEMI) myocardial infarction: Secondary | ICD-10-CM | POA: Diagnosis present

## 2019-04-01 DIAGNOSIS — E1140 Type 2 diabetes mellitus with diabetic neuropathy, unspecified: Secondary | ICD-10-CM | POA: Diagnosis present

## 2019-04-01 DIAGNOSIS — Z85048 Personal history of other malignant neoplasm of rectum, rectosigmoid junction, and anus: Secondary | ICD-10-CM | POA: Diagnosis not present

## 2019-04-01 DIAGNOSIS — I252 Old myocardial infarction: Secondary | ICD-10-CM | POA: Diagnosis not present

## 2019-04-01 DIAGNOSIS — Z951 Presence of aortocoronary bypass graft: Secondary | ICD-10-CM | POA: Diagnosis not present

## 2019-04-01 DIAGNOSIS — E785 Hyperlipidemia, unspecified: Secondary | ICD-10-CM | POA: Diagnosis present

## 2019-04-01 DIAGNOSIS — I25118 Atherosclerotic heart disease of native coronary artery with other forms of angina pectoris: Secondary | ICD-10-CM | POA: Diagnosis present

## 2019-04-01 DIAGNOSIS — Z7902 Long term (current) use of antithrombotics/antiplatelets: Secondary | ICD-10-CM

## 2019-04-01 DIAGNOSIS — I219 Acute myocardial infarction, unspecified: Secondary | ICD-10-CM

## 2019-04-01 DIAGNOSIS — R079 Chest pain, unspecified: Secondary | ICD-10-CM | POA: Diagnosis present

## 2019-04-01 DIAGNOSIS — E119 Type 2 diabetes mellitus without complications: Secondary | ICD-10-CM

## 2019-04-01 DIAGNOSIS — Z7984 Long term (current) use of oral hypoglycemic drugs: Secondary | ICD-10-CM

## 2019-04-01 HISTORY — DX: Acute myocardial infarction, unspecified: I21.9

## 2019-04-01 LAB — CBC
HCT: 37.6 % — ABNORMAL LOW (ref 39.0–52.0)
Hemoglobin: 13 g/dL (ref 13.0–17.0)
MCH: 31 pg (ref 26.0–34.0)
MCHC: 34.6 g/dL (ref 30.0–36.0)
MCV: 89.5 fL (ref 80.0–100.0)
Platelets: 157 10*3/uL (ref 150–400)
RBC: 4.2 MIL/uL — ABNORMAL LOW (ref 4.22–5.81)
RDW: 13.6 % (ref 11.5–15.5)
WBC: 5.7 10*3/uL (ref 4.0–10.5)
nRBC: 0 % (ref 0.0–0.2)

## 2019-04-01 LAB — BASIC METABOLIC PANEL
Anion gap: 12 (ref 5–15)
BUN: 16 mg/dL (ref 8–23)
CO2: 22 mmol/L (ref 22–32)
Calcium: 8.9 mg/dL (ref 8.9–10.3)
Chloride: 96 mmol/L — ABNORMAL LOW (ref 98–111)
Creatinine, Ser: 0.97 mg/dL (ref 0.61–1.24)
GFR calc Af Amer: >60 mL/min (ref >60)
GFR calc non Af Amer: >60 mL/min (ref >60)
Glucose, Bld: 263 mg/dL — ABNORMAL HIGH (ref 70–99)
Potassium: 3.9 mmol/L (ref 3.5–5.1)
Sodium: 130 mmol/L — ABNORMAL LOW (ref 135–145)

## 2019-04-01 LAB — SARS CORONAVIRUS 2 BY RT PCR (HOSPITAL ORDER, PERFORMED IN ~~LOC~~ HOSPITAL LAB): SARS Coronavirus 2: NEGATIVE

## 2019-04-01 LAB — TROPONIN I: Troponin I: <0.03 ng/mL (ref <0.03)

## 2019-04-01 MED ORDER — HYDROCODONE-ACETAMINOPHEN 7.5-325 MG PO TABS
0.5000 | ORAL_TABLET | Freq: Three times a day (TID) | ORAL | Status: DC | PRN
  Administered 2019-04-02 – 2019-04-03 (×4): 0.5 via ORAL
  Filled 2019-04-01 (×4): qty 1

## 2019-04-01 MED ORDER — HEPARIN (PORCINE) 25000 UT/250ML-% IV SOLN: 10.0000 [IU]/kg/h | INTRAVENOUS | Status: DC

## 2019-04-01 MED ORDER — HEPARIN SODIUM (PORCINE) 5000 UNIT/ML IJ SOLN
4000.0000 [IU] | Freq: Once | INTRAMUSCULAR | Status: AC
  Administered 2019-04-01: 4000 [IU] via INTRAVENOUS
  Filled 2019-04-01: qty 1

## 2019-04-01 MED ORDER — HEPARIN (PORCINE) 25000 UT/250ML-% IV SOLN
1300.0000 [IU]/h | INTRAVENOUS | Status: DC
  Administered 2019-04-01: 21:00:00 1300 [IU]/h via INTRAVENOUS
  Filled 2019-04-01: qty 250

## 2019-04-01 MED ORDER — NITROGLYCERIN 2 % TD OINT
1.0000 [in_us] | TOPICAL_OINTMENT | Freq: Once | TRANSDERMAL | Status: AC
  Administered 2019-04-01: 20:00:00 1 [in_us] via TOPICAL

## 2019-04-01 MED ORDER — NITROGLYCERIN 2 % TD OINT
1.0000 [in_us] | TOPICAL_OINTMENT | Freq: Once | TRANSDERMAL | Status: AC
  Administered 2019-04-01: 20:00:00 1 [in_us] via TOPICAL
  Filled 2019-04-01: qty 1

## 2019-04-01 MED ORDER — METOPROLOL TARTRATE 5 MG/5ML IV SOLN
5.0000 mg | Freq: Once | INTRAVENOUS | Status: AC
  Administered 2019-04-01: 21:00:00 2.5 mg via INTRAVENOUS

## 2019-04-01 MED ORDER — MORPHINE SULFATE (PF) 2 MG/ML IV SOLN
2.0000 mg | INTRAVENOUS | Status: DC | PRN
  Administered 2019-04-02: 03:00:00 2 mg via INTRAVENOUS
  Filled 2019-04-01: qty 1

## 2019-04-01 MED ORDER — METOPROLOL TARTRATE 5 MG/5ML IV SOLN
INTRAVENOUS | Status: AC
  Filled 2019-04-01: qty 5

## 2019-04-01 NOTE — ED Notes (Signed)
Heaparin verified by Francoise Schaumann

## 2019-04-01 NOTE — Progress Notes (Signed)
ANTICOAGULATION CONSULT NOTE - Initial Consult  Pharmacy Consult for Heparin  Indication: chest pain/ACS  Allergies  Allergen Reactions  . Celebrex [Celecoxib] Other (See Comments)    GI bleed  . Oxycodone Other (See Comments)    Hallucinations and agitations  . Prednisone Other (See Comments)    Can't take high doses  . Zoloft [Sertraline Hcl] Other (See Comments)    Crazy    Patient Measurements: Height: 6' (182.9 cm) Weight: 218 lb (98.9 kg) IBW/kg (Calculated) : 77.6 Heparin Dosing Weight:  97.6 kg   Vital Signs: Temp: 98 F (36.7 C) (06/01 2009) Temp Source: Oral (06/01 2009) BP: 161/102 (06/01 2009) Pulse Rate: 108 (06/01 2009)  Labs: Recent Labs    04/01/19 2011  HGB 13.0  HCT 37.6*  PLT 157    CrCl cannot be calculated (Patient's most recent lab result is older than the maximum 21 days allowed.).   Medical History: Past Medical History:  Diagnosis Date  . Anemia   . Cardiovascular disease   . Diabetes mellitus ([place])    Type II  . Hypertension   . Neuropathy due to secondary diabetes mellitus ([place])   . Osteoarthritis     Medications:  (Not in a hospital admission)   Assessment: Pharmacy consulted to dose heparin in this 63 year old male admitted with ACS/NSTEMI.  No prior anticoag noted.  CrCl =   Goal of Therapy:  Heparin level 0.3-0.7 units/ml Monitor platelets by anticoagulation protocol: Yes   Plan:  Give 4000 units bolus x 1 Start heparin infusion at 1300 units/hr Check anti-Xa level in 6 hours and daily while on heparin Continue to monitor H&H and platelets  Damarien Nyman D 04/01/2019,8:31 PM

## 2019-04-01 NOTE — H&P (Signed)
[place] [facility] [place] NAME: Kent [name]    MR#:  [id number]  DATE OF BIRTH:  11-Dec-1955  DATE OF ADMISSION:  04/01/2019  PRIMARY CARE PHYSICIAN: Rusty Aus, MD   REQUESTING/REFERRING PHYSICIAN: Cinda Quest, MD  CHIEF COMPLAINT:   Chief Complaint  Patient presents with  . Chest Pain    HISTORY OF PRESENT ILLNESS:  Kent [name]  is a 63 y.o. male who presents with chief complaint as above.  Patient presents the ED with a complaint of chest discomfort.  He states that his shoulders and neck were hurting earlier today.  He went to his physician and received cortisone shots in each shoulder.  He states that helped the pain.  However then at home he developed significant chest pain.  He states that it is a burning and squeezing sensation.  He has a known history of significant cardiac issues, including prior single-vessel bypass.  This was due to complication during stenting.  He states that he lives frequently with some amount of stable angina.  However, his symptoms today did not improve after he took nitro and full dose aspirin.  Here in the ED he does have some anterior lead EKG changes (ST depression).  His pain improved with Nitropaste.  His initial cardiac enzyme is within normal limits, however he was started on a heparin drip and hospitalist were called for admission.  PAST MEDICAL HISTORY:   Past Medical History:  Diagnosis Date  . Anemia   . Cardiovascular disease   . Diabetes mellitus ([place])    Type II  . Hypertension   . Neuropathy due to secondary diabetes mellitus ([place])   . Osteoarthritis      PAST SURGICAL HISTORY:   Past Surgical History:  Procedure Laterality Date  . BILATERAL CARPAL TUNNEL RELEASE     both right and left done two different years  . bilateral leg surgery  2006   Both legs crushed, [facility] of [facility] surgeries at [facility] in 2006  . BYPASS GRAFT  1996   cardiac bypass done in 1996  . colorectal surgery   2000   For colorectal cancer, done by DR. ely and Dr. Sharlet Salina  . CORONARY ANGIOPLASTY WITH STENT PLACEMENT  2014  . CORONARY STENT INTERVENTION N/A 12/05/2017   Procedure: CORONARY STENT INTERVENTION;  Surgeon: Isaias Cowman, MD;  Location: [place] [facility];  Service: Cardiovascular;  Laterality: N/A;  . LEFT HEART CATH AND CORS/GRAFTS ANGIOGRAPHY N/A 12/05/2017   Procedure: LEFT HEART CATH AND CORS/GRAFTS ANGIOGRAPHY;  Surgeon: Isaias Cowman, MD;  Location: [place] [facility];  Service: Cardiovascular;  Laterality: N/A;     SOCIAL HISTORY:   Social History   Tobacco Use  . Smoking status: Former Research scientist (life sciences)  . Smokeless tobacco: Never Used  Substance Use Topics  . Alcohol use: No    Frequency: Never     FAMILY HISTORY:   Family History  Problem Relation Age of Onset  . Cancer Mother   . Heart disease Mother   . Heart disease Father        Wagner's Disease  . Cancer Father   . Fibromyalgia Sister   . Heart disease Brother   . Heart disease Brother      DRUG ALLERGIES:   Allergies  Allergen Reactions  . Celebrex [Celecoxib] Other (See Comments)    GI bleed  . Oxycodone Other (See Comments)    Hallucinations and agitations  . Prednisone Other (See Comments)  Can't take high doses  . Zoloft [Sertraline Hcl] Other (See Comments)    Crazy    MEDICATIONS AT HOME:   Prior to Admission medications   Medication Sig Start Date End Date Taking? Authorizing Provider  ALPRAZolam (XANAX) 0.25 MG tablet Take 0.125 mg by mouth 3 (three) times daily as needed for anxiety.    [provider]  carvedilol (COREG) 25 MG tablet Take 25 mg by mouth 4 (four) times daily.    [provider]  clopidogrel (PLAVIX) 75 MG tablet Take 75 mg by mouth daily.    [provider]  ferrous sulfate 325 (65 FE) MG tablet Take 325 mg by mouth 2 (two) times daily with a meal.    [provider]  gabapentin (NEURONTIN) 300 MG capsule Take 300 mg by  mouth See admin instructions. Take 300 mg by mouth at supper and take 300 mg by mouth at bedtime    [provider]  HYDROcodone-acetaminophen (NORCO) 7.5-325 MG tablet Take 0.5 tablets by mouth 3 (three) times daily as needed for moderate pain.    [provider]  ibuprofen (ADVIL,MOTRIN) 200 MG tablet Take 200-400 mg by mouth See admin instructions. Take 400 mg by mouth in the morning, take 200 mg by mouth at lunch and take 200 mg by mouth at bedtime    [provider]  isosorbide mononitrate (IMDUR) 30 MG 24 hr tablet Take 30 mg by mouth daily.    [provider]  lisinopril (PRINIVIL,ZESTRIL) 20 MG tablet Take 20 mg by mouth daily.    [provider]  Magnesium 500 MG TABS Take 500 mg by mouth daily.    [provider]  Melatonin 10 MG CAPS Take 10 mg by mouth at bedtime.    [provider]  metFORMIN (GLUCOPHAGE) 500 MG tablet Take 500 mg by mouth 3 (three) times daily.    [provider]  nitroGLYCERIN (NITROSTAT) 0.4 MG SL tablet Place 0.4 mg under the tongue every 5 (five) minutes as needed for chest pain.    [provider]  nitroGLYCERIN (NITROSTAT) 0.4 MG SL tablet Place 1 tablet (0.4 mg total) under the tongue every 5 (five) minutes as needed for chest pain. 09/29/18 09/29/19  Nance Pear, MD  omeprazole (PRILOSEC) 20 MG capsule Take 20 mg by mouth daily.    [provider]  PARoxetine (PAXIL) 20 MG tablet Take 20 mg by mouth daily.    [provider]  simvastatin (ZOCOR) 40 MG tablet Take 40 mg by mouth at bedtime.    [provider]    REVIEW OF SYSTEMS:  Review of Systems  Constitutional: Negative for chills, fever, malaise/fatigue and weight loss.  HENT: Negative for ear pain, hearing loss and tinnitus.   Eyes: Negative for blurred vision, double vision, pain and redness.  Respiratory: Negative for cough, hemoptysis and shortness of breath.   Cardiovascular: Positive  for chest pain. Negative for palpitations, orthopnea and leg swelling.  Gastrointestinal: Negative for abdominal pain, constipation, diarrhea, nausea and vomiting.  Genitourinary: Negative for dysuria, frequency and hematuria.  Musculoskeletal: Negative for back pain, joint pain and neck pain.  Skin:       No acne, rash, or lesions  Neurological: Negative for dizziness, tremors, focal weakness and weakness.  Endo/Heme/Allergies: Negative for polydipsia. Does not bruise/bleed easily.  Psychiatric/Behavioral: Negative for depression. The patient is not nervous/anxious and does not have insomnia.      VITAL SIGNS:   Vitals:   04/01/19  2010 04/01/19 2030 04/01/19 2040 04/01/19 2052  BP:  (!) 156/109 (!) 160/95 (!) 166/108  Pulse:  (!) 113 (!) 113 (!) 113  Resp:  (!) 21 (!) 21 16  Temp:      TempSrc:      SpO2:  99% 98% 99%  Weight: 98.9 kg     Height: 6' (1.829 m)      Wt Readings from Last 3 Encounters:  04/01/19 98.9 kg  09/29/18 104.8 kg  12/06/17 111 kg    PHYSICAL EXAMINATION:  Physical Exam  Vitals reviewed. Constitutional: He is oriented to person, place, and time. He appears well-developed and well-nourished. No distress.  HENT:  Head: Normocephalic and atraumatic.  Mouth/Throat: Oropharynx is clear and moist.  Eyes: Pupils are equal, round, and reactive to light. Conjunctivae and EOM are normal. No scleral icterus.  Neck: Normal range of motion. Neck supple. No JVD present. No thyromegaly present.  Cardiovascular: Regular rhythm and intact distal pulses. Exam reveals no gallop and no friction rub.  No murmur heard. Tachycardic  Respiratory: Effort normal and breath sounds normal. No respiratory distress. He has no wheezes. He has no rales.  GI: Soft. Bowel sounds are normal. He exhibits no distension. There is no abdominal tenderness.  Musculoskeletal: Normal range of motion.        General: No edema.     Comments: No arthritis, no gout  Lymphadenopathy:    He has  no cervical adenopathy.  Neurological: He is alert and oriented to person, place, and time. No cranial nerve deficit.  No dysarthria, no aphasia  Skin: Skin is warm and dry. No rash noted. No erythema.  Psychiatric: He has a normal mood and affect. His behavior is normal. Judgment and thought content normal.    LABORATORY PANEL:   CBC Recent Labs  Lab 04/01/19 2011  WBC 5.7  HGB 13.0  HCT 37.6*  PLT 157   ------------------------------------------------------------------------------------------------------------------  Chemistries  Recent Labs  Lab 04/01/19 2011  NA 130*  K 3.9  CL 96*  CO2 22  GLUCOSE 263*  BUN 16  CREATININE 0.97  CALCIUM 8.9   ------------------------------------------------------------------------------------------------------------------  Cardiac Enzymes Recent Labs  Lab 04/01/19 2011  TROPONINI <0.03   ------------------------------------------------------------------------------------------------------------------  RADIOLOGY:  Dg Chest Portable 1 View  Result Date: 04/01/2019 CLINICAL DATA:  Chest pain with shortness of breath for 2 weeks, worse in the past day. EXAM: PORTABLE CHEST 1 VIEW COMPARISON:  09/29/2018 FINDINGS: Pacing/defibrillator pads partially obscure the left lung. 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 The cardiomediastinal silhouette is unchanged with normal heart size. No airspace consolidation, edema, pleural effusion, pneumothorax is identified. No acute osseous abnormality is seen. IMPRESSION: No active disease. Electronically Signed   By: Logan Bores M.D.   On: 04/01/2019 20:37    EKG:   Orders placed or performed during the hospital encounter of 04/01/19  . EKG 12-Lead  . EKG 12-Lead  . ED EKG  . ED EKG    IMPRESSION AND PLAN:  Principal Problem:   Chest pain -trend cardiac enzymes, nitro tonight for symptom control, continue heparin drip, will utilize beta-blocker to improve his blood pressure and slow his  heart rate some, get echocardiogram and cardiology consult Active Problems:   CAD (coronary artery disease) -continue home meds, other work-up as above   Diabetes (HCC) -sliding scale insulin coverage   HTN (hypertension) -home dose antihypertensives  Chart review performed and case discussed with ED provider. Labs, imaging and/or ECG reviewed by provider and discussed  with patient/family. Management plans discussed with the patient and/or family.  COVID-19 status: Test pending  DVT PROPHYLAXIS: Systemic anticoagulation  GI PROPHYLAXIS:  PPI   ADMISSION STATUS: Inpatient     CODE STATUS: Full Code Status History    Date Active Date Inactive Code Status Order ID Comments User Context   12/05/2017 0929 12/06/2017 1422 Full Code [id number]  Isaias Cowman, MD Inpatient      TOTAL TIME TAKING CARE OF THIS PATIENT: 45 minutes.   This patient was evaluated in the context of the global COVID-19 pandemic, which necessitated consideration that the patient might be at risk for infection with the SARS-CoV-2 virus that causes COVID-19. Institutional protocols and algorithms that pertain to the evaluation of patients at risk for COVID-19 are in a state of rapid change based on information released by regulatory bodies including the CDC and federal and state organizations. These policies and algorithms were followed to the best of this provider's knowledge to date during the patient's care at this facility.  Ethlyn Daniels 04/01/2019, 9:22 PM  [facility]  Office  [phone]  CC: Primary care physician; Rusty Aus, MD  Note:  This document was prepared using Dragon voice recognition software and may include unintentional dictation errors.

## 2019-04-01 NOTE — ED Notes (Signed)
P 

## 2019-04-01 NOTE — ED Notes (Signed)
.. ED TO INPATIENT HANDOFF REPORT  ED Nurse Name and Phone #: Deneise Lever [phone]  Y Name/Age/Gender Kent [name] 63 y.o. male Room/Bed: ED15A/ED15A  Code Status   Code Status: Prior  Home/SNF/Other Home Patient oriented to: self, place, time and situation Is this baseline? Yes   Triage Complete: Triage complete  Chief Complaint Chest Pain  Triage Note Pt c/o central chest pain with SOB x2 weeks, worse in ,last 24 hours. Pt has extensive cardiac hx. Pt took Asprin and 2 nitroglycerin, with relief but pain gradually came back after 30 minutes. MD made aware.     Allergies Allergies  Allergen Reactions  . Celebrex [Celecoxib] Other (See Comments)    GI bleed  . Oxycodone Other (See Comments)    Hallucinations and agitations  . Prednisone Other (See Comments)    Can't take high doses  . Zoloft [Sertraline Hcl] Other (See Comments)    Crazy    Level of Care/Admitting Diagnosis ED Disposition    ED Disposition Condition Comment   Admit  The patient appears reasonably stabilized for admission considering the current resources, flow, and capabilities available in the ED at this time, and I doubt any other [facility] requiring further screening and/or treatment in the ED prior to admission is  present.       B Medical/Surgery History Past Medical History:  Diagnosis Date  . Anemia   . Cardiovascular disease   . Diabetes mellitus ([place])    Type II  . Hypertension   . Neuropathy due to secondary diabetes mellitus ([place])   . Osteoarthritis    Past Surgical History:  Procedure Laterality Date  . BILATERAL CARPAL TUNNEL RELEASE     both right and left done two different years  . bilateral leg surgery  2006   Both legs crushed, total of 6 surgeries at [facility] in 2006  . BYPASS GRAFT  1996   cardiac bypass done in 1996  . colorectal surgery  2000   For colorectal cancer, done by DR. ely and Dr. Sharlet Salina  . CORONARY ANGIOPLASTY WITH STENT PLACEMENT  2014  . CORONARY  STENT INTERVENTION N/A 12/05/2017   Procedure: CORONARY STENT INTERVENTION;  Surgeon: Isaias Cowman, MD;  Location: [facility];  Service: Cardiovascular;  Laterality: N/A;  . LEFT HEART CATH AND CORS/GRAFTS ANGIOGRAPHY N/A 12/05/2017   Procedure: LEFT HEART CATH AND CORS/GRAFTS ANGIOGRAPHY;  Surgeon: Isaias Cowman, MD;  Location: [facility];  Service: Cardiovascular;  Laterality: N/A;     A IV Location/Drains/Wounds Patient Lines/Drains/Airways Status   Active Line/Drains/Airways    Name:   Placement date:   Placement time:   Site:   Days:   Peripheral IV 04/01/19 Left Hand   04/01/19    2020    Hand   less than 1   Peripheral IV 04/01/19 Right Forearm   04/01/19    2020    Forearm   less than 1          Intake/Output Last 24 hours No intake or output data in the 24 hours ending 04/01/19 2111  Labs/Imaging Results for orders placed or performed during the hospital encounter of 04/01/19 (from the past 48 hour(s))  Basic metabolic panel     Status: Abnormal   Collection Time: 04/01/19  8:11 PM  Result Value Ref Range   Sodium 130 (L) 135 - 145 mmol/L   Potassium 3.9 3.5 - 5.1 mmol/L   Chloride 96 (L) 98 - 111 mmol/L  CO2 22 22 - 32 mmol/L   Glucose, Bld 263 (H) 70 - 99 mg/dL   BUN 16 8 - 23 mg/dL   Creatinine, Ser 0.97 0.61 - 1.24 mg/dL   Calcium 8.9 8.9 - 10.3 mg/dL   GFR calc non Af Amer >60 >60 mL/min   GFR calc Af Amer >60 >60 mL/min   Anion gap 12 5 - 15    Comment: Performed at [facility], [address] [place], [place] [address]  CBC     Status: Abnormal   Collection Time: 04/01/19  8:11 PM  Result Value Ref Range   WBC 5.7 4.0 - 10.5 K/uL   RBC 4.20 (L) 4.22 - 5.81 MIL/uL   Hemoglobin 13.0 13.0 - 17.0 g/dL   HCT 37.6 (L) 39.0 - 52.0 %   MCV 89.5 80.0 - 100.0 fL   MCH 31.0 26.0 - 34.0 pg   MCHC 34.6 30.0 - 36.0 g/dL   RDW 13.6 11.5 - 15.5 %   Platelets 157 150 - 400 K/uL   nRBC 0.0 0.0 - 0.2 %    Comment: Performed  at [facility], [address] [place], [place] [address]  Troponin I - ONCE - STAT     Status: None   Collection Time: 04/01/19  8:11 PM  Result Value Ref Range   Troponin I <0.03 <0.03 ng/mL    Comment: Performed at [facility], [address] [place], [place] [address]   Dg Chest Portable 1 View  Result Date: 04/01/2019 CLINICAL DATA:  Chest pain with shortness of breath for 2 weeks, worse in the past day. EXAM: PORTABLE CHEST 1 VIEW COMPARISON:  09/29/2018 FINDINGS: Pacing/defibrillator pads partially obscure the left lung. 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 The cardiomediastinal silhouette is unchanged with normal heart size. No airspace consolidation, edema, pleural effusion, pneumothorax is identified. No acute osseous abnormality is seen. IMPRESSION: No active disease. Electronically Signed   By: Logan Bores M.D.   On: 04/01/2019 20:37    Pending Labs Unresulted Labs (From admission, onward)   None      Vitals/Pain Today's Vitals   04/01/19 2030 04/01/19 2040 04/01/19 2047 04/01/19 2052  BP: (!) 156/109 (!) 160/95  (!) 166/108  Pulse: (!) 113 (!) 113  (!) 113  Resp: (!) 21 (!) 21  16  Temp:      TempSrc:      SpO2: 99% 98%  99%  Weight:      Height:      PainSc:   4      Isolation Precautions No active isolations  Medications Medications  heparin ADULT infusion 100 units/mL (25000 units/279mL sodium chloride 0.45%) (1,300 Units/hr Intravenous New Bag/Given 04/01/19 2046)  nitroGLYCERIN (NITROGLYN) 2 % ointment 1 inch (1 inch Topical Given 04/01/19 2019)  heparin injection 4,000 Units (4,000 Units Intravenous Given 04/01/19 2021)  nitroGLYCERIN (NITROGLYN) 2 % ointment 1 inch (1 inch Topical Given 04/01/19 2027)    Mobility walks with device Low fall risk   Focused Assessments Cardiac Assessment Handoff:  Cardiac Rhythm: Sinus tachycardia Lab Results  Component Value Date   CKTOTAL 447 (H) 04/01/2013   CKMB 29.3 (H) 04/01/2013    TROPONINI <0.03 04/01/2019   No results found for: DDIMER Does the Patient currently have chest pain? Yes     R Recommendations: See Admitting Provider Note  Report given to:   Additional Notes:

## 2019-04-01 NOTE — ED Notes (Signed)
Zoll Pads placed on pt at this time. MD Malinda at bedside.

## 2019-04-01 NOTE — ED Provider Notes (Addendum)
[facility] Emergency Department Provider Note   ____________________________________________   First MD Initiated Contact with Patient 04/01/19 2013     (approximate)  I have reviewed the triage vital signs and the nursing notes.   HISTORY  Chief Complaint Chest Pain    HPI Kent [name] is a 63 y.o. male who reports that after dinner at about 6 PM he went to sit down and developed chest tightness.  Is exactly like his usual angina.  He has had shortness of breath for about 2 weeks but got worse with this.  No real nausea or sweating.  He took aspirin and nitro and another nitro and the pain gradually got better.  Is much better now only about half as bad as it was.  Again it felt just like his usual angina.  Does not seem to radiate.         Past Medical History:  Diagnosis Date  . Anemia   . Cardiovascular disease   . Diabetes mellitus ([place])    Type II  . Hypertension   . 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 Osteoarthritis     Patient Active Problem List   Diagnosis Date Noted  . CAD (coronary artery disease) 12/05/2017    Past Surgical History:  Procedure Laterality Date  . BILATERAL CARPAL TUNNEL RELEASE     both right and left done two different years  . bilateral leg surgery  2006   Both legs crushed, total of 6 surgeries at [facility] in 2006  . BYPASS GRAFT  1996   cardiac bypass done in 1996  . colorectal surgery  2000   For colorectal cancer, done by DR. ely and Dr. Sharlet Salina  . CORONARY ANGIOPLASTY WITH STENT PLACEMENT  2014  . CORONARY STENT INTERVENTION N/A 12/05/2017   Procedure: CORONARY STENT INTERVENTION;  Surgeon: Isaias Cowman, MD;  Location: [place];  Service: Cardiovascular;  Laterality: N/A;  . LEFT HEART CATH AND CORS/GRAFTS ANGIOGRAPHY N/A 12/05/2017   Procedure: LEFT HEART CATH AND CORS/GRAFTS ANGIOGRAPHY;  Surgeon: Isaias Cowman, MD;  Location: [place] [facility];  Service: Cardiovascular;  Laterality: N/A;    Prior to Admission medications   Medication Sig Start Date End Date Taking? Authorizing Provider  ALPRAZolam (XANAX) 0.25 MG tablet Take 0.125 mg by mouth 3 (three) times daily as needed for anxiety.    [provider]  carvedilol (COREG) 25 MG tablet Take 25 mg by mouth 4 (four) times daily.    [provider]  clopidogrel (PLAVIX) 75 MG tablet Take 75 mg by mouth daily.    [provider]  ferrous sulfate 325 (65 FE) MG tablet Take 325 mg by mouth 2 (two) times daily with a meal.    [provider]  gabapentin (NEURONTIN) 300 MG capsule Take 300 mg by mouth See admin instructions. Take 300 mg by mouth at supper and take 300 mg by mouth at bedtime    [provider]  HYDROcodone-acetaminophen (NORCO) 7.5-325 MG tablet Take 0.5 tablets by mouth 3 (three) times daily as needed for moderate pain.    [provider]  ibuprofen (ADVIL,MOTRIN) 200 MG tablet Take 200-400 mg by mouth See admin instructions. Take 400 mg by mouth in the morning, take 200 mg by mouth at lunch and take 200 mg by mouth at bedtime    [provider]  isosorbide mononitrate (IMDUR) 30 MG 24 hr tablet Take 30 mg by  mouth daily.    [provider]  lisinopril (PRINIVIL,ZESTRIL) 20 MG tablet Take 20 mg by mouth daily.    [provider]  Magnesium 500 MG TABS Take 500 mg by mouth daily.    [provider]  Melatonin 10 MG CAPS Take 10 mg by mouth at bedtime.    [provider]  metFORMIN (GLUCOPHAGE) 500 MG tablet Take 500 mg by mouth 3 (three) times daily.    [provider]  nitroGLYCERIN (NITROSTAT) 0.4 MG SL tablet Place 0.4 mg under the tongue every 5 (five) minutes as needed for chest pain.    [provider]  nitroGLYCERIN (NITROSTAT) 0.4 MG SL tablet Place 1 tablet (0.4 mg total) under the tongue every 5 (five) minutes as needed for chest pain. 09/29/18  09/29/19  Nance Pear, MD  omeprazole (PRILOSEC) 20 MG capsule Take 20 mg by mouth daily.    [provider]  PARoxetine (PAXIL) 20 MG tablet Take 20 mg by mouth daily.    [provider]  simvastatin (ZOCOR) 40 MG tablet Take 40 mg by mouth at bedtime.    [provider]    Allergies Celebrex [celecoxib]; Oxycodone; Prednisone; and Zoloft [sertraline hcl]  Family History  Problem Relation Age of Onset  . Cancer Mother   . Heart disease Mother   . Heart disease Father        Wagner's Disease  . Cancer Father   . Fibromyalgia Sister   . Heart disease Brother   . Heart disease Brother     Social History Social History   Tobacco Use  . Smoking status: Former Research scientist (life sciences)  . Smokeless tobacco: Never Used  Substance Use Topics  . Alcohol use: No    Frequency: Never  . Drug use: No    View of systems: Constitutional: No fever/chills Eyes: No visual changes. ENT: No sore throat. Cardiovascular: See HPI. Respiratory: See HPI Gastrointestinal: No abdominal pain.  No nausea, no vomiting.  No diarrhea.  No constipation. Genitourinary: Negative for dysuria. Musculoskeletal: Negative for back pain. Skin: Negative for rash. Neurological: Negative for headaches, focal weakness   ____________________________________________   PHYSICAL EXAM:  VITAL SIGNS: ED Triage Vitals  Enc Vitals Group     BP 04/01/19 2009 (!) 161/102     Pulse Rate 04/01/19 2009 (!) 108     Resp 04/01/19 2009 (!) 22     Temp 04/01/19 2009 98 F (36.7 C)     Temp Source 04/01/19 2009 Oral     SpO2 04/01/19 2009 98 %     Weight 04/01/19 2010 218 lb (98.9 kg)     Height 04/01/19 2010 6' (1.829 m)     Head Circumference --      Peak Flow --      Pain Score --      Pain Loc --      Pain Edu? --      Excl. in [place]? --     Constitutional: Alert and oriented. Well appearing and in no acute distress. Eyes: Conjunctivae are normal.  Head: Atraumatic. Nose: No  congestion/rhinnorhea. Mouth/Throat: Mucous membranes are moist.  Oropharynx non-erythematous. Neck: No stridor.   Cardiovascular: Normal rate, regular rhythm. Grossly normal heart sounds.  Good peripheral circulation. Respiratory: Normal respiratory effort.  No retractions. Lungs CTAB. Gastrointestinal: Soft and nontender. No distention. No abdominal bruits. No CVA tenderness. Musculoskeletal: No lower extremity tenderness nor edema.   Neurologic:  Normal speech and language. No gross focal neurologic deficits  are appreciated. Skin:  Skin is warm, dry and intact. No rash noted. Psychiatric: Mood and affect are normal. Speech and behavior are normal.  ____________________________________________   LABS (all labs ordered are listed, but only abnormal results are displayed)  Labs Reviewed  BASIC METABOLIC PANEL - Abnormal; Notable for the following components:      Result Value   Sodium 130 (*)    Chloride 96 (*)    Glucose, Bld 263 (*)    All other components within normal limits  CBC - Abnormal; Notable for the following components:   RBC 4.20 (*)    HCT 37.6 (*)    All other components within normal limits  TROPONIN I   ____________________________________________  EKG  EKG read interpreted by me shows sinus tachycardia rate of 102 normal axis there is slight ST elevation in lead III not quite a millimeter ST segment depression in 1 and L and V2.  There is T elevation in aVR ____________________________________________  RADIOLOGY  ED MD interpretation: .  Official radiology report(s): Dg Chest Portable 1 View  Result Date: 04/01/2019 CLINICAL DATA:  Chest pain with shortness of breath for 2 weeks, worse in the past day. EXAM: PORTABLE CHEST 1 VIEW COMPARISON:  09/29/2018 FINDINGS: Pacing/defibrillator pads partially obscure the left lung. 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 The cardiomediastinal silhouette is unchanged with normal heart size. No airspace consolidation,  edema, pleural effusion, pneumothorax is identified. No acute osseous abnormality is seen. IMPRESSION: No active disease. Electronically Signed   By: Logan Bores M.D.   On: 04/01/2019 20:37    ____________________________________________   PROCEDURES  Procedure(s) performed (including Critical Care): Critical care time 18 minutes.  This includes reviewing the patient's old records evaluating the patient and discussing with Dr. Hale Drone and Dr. Fletcher Anon  Procedures   ____________________________________________   INITIAL IMPRESSION / ASSESSMENT AND PLAN / ED COURSE  Notified Dr. Saralyn Pilar of the patient's being here.  I reviewed the EKG with Dr. Fletcher Anon who is on-call for STEMI.  The ST segment depressions in 1 and L are new the aVR elevation is isolated and there is not really any elevation in 3 it is more like PR segment depression.  We will get that gentleman in the hospital.  His pain is improved although not gone with 2 inches of Nitropaste he has had heparin he has had a total of 4 baby aspirin's with this somewhat home with some here.  He already takes Plavix.  First troponin is negative but this is likely an end STEMI with EKG changes and pain I will diagnose him with.    Kent [name] was evaluated in Emergency Department on 04/01/2019 for the symptoms described in the history of present illness. He was evaluated in the context of the global COVID-19 pandemic, which necessitated consideration that the patient might be at risk for infection with the SARS-CoV-2 virus that causes COVID-19. Institutional protocols and algorithms that pertain to the evaluation of patients at risk for COVID-19 are in a state of rapid change based on information released by regulatory bodies including the CDC and federal and state organizations. These policies and algorithms were followed during the patient's care in the ED.          ____________________________________________   FINAL CLINICAL  IMPRESSION(S) / ED DIAGNOSES  Final diagnoses:  NSTEMI (non-ST elevated myocardial infarction) [facility])     ED Discharge Orders    None       Note:  This document was  prepared using Systems analyst and may include unintentional dictation errors.    Nena Polio, MD 04/01/19 2108    Nena Polio, MD 04/01/19 2239    Nena Polio, MD 04/08/19 [phone]

## 2019-04-01 NOTE — ED Triage Notes (Signed)
Pt c/o central chest pain with SOB x2 weeks, worse in ,last 24 hours. Pt has extensive cardiac hx. Pt took Asprin and 2 nitroglycerin, with relief but pain gradually came back after 30 minutes. MD made aware.

## 2019-04-02 ENCOUNTER — Encounter: Payer: Self-pay | Admitting: *Deleted

## 2019-04-02 ENCOUNTER — Inpatient Hospital Stay: Admit: 2019-04-02 | Payer: Medicare Other

## 2019-04-02 ENCOUNTER — Other Ambulatory Visit: Payer: Self-pay

## 2019-04-02 ENCOUNTER — Encounter
Admission: EM | Disposition: A | Discharge status: Home / Self Care | Payer: Self-pay | Source: Home / Self Care | Attending: Internal Medicine

## 2019-04-02 HISTORY — PX: CORONARY STENT INTERVENTION: CATH118234

## 2019-04-02 HISTORY — PX: LEFT HEART CATH AND CORS/GRAFTS ANGIOGRAPHY: CATH118250

## 2019-04-02 LAB — BASIC METABOLIC PANEL
Anion gap: 10 (ref 5–15)
BUN: 13 mg/dL (ref 8–23)
CO2: 23 mmol/L (ref 22–32)
Calcium: 8.7 mg/dL — ABNORMAL LOW (ref 8.9–10.3)
Chloride: 101 mmol/L (ref 98–111)
Creatinine, Ser: 0.63 mg/dL (ref 0.61–1.24)
GFR calc Af Amer: >60 mL/min (ref >60)
GFR calc non Af Amer: >60 mL/min (ref >60)
Glucose, Bld: 125 mg/dL — ABNORMAL HIGH (ref 70–99)
Potassium: 3.8 mmol/L (ref 3.5–5.1)
Sodium: 134 mmol/L — ABNORMAL LOW (ref 135–145)

## 2019-04-02 LAB — CBC
HCT: 35.8 % — ABNORMAL LOW (ref 39.0–52.0)
Hemoglobin: 12.5 g/dL — ABNORMAL LOW (ref 13.0–17.0)
MCH: 30.7 pg (ref 26.0–34.0)
MCHC: 34.9 g/dL (ref 30.0–36.0)
MCV: 88 fL (ref 80.0–100.0)
Platelets: 173 10*3/uL (ref 150–400)
RBC: 4.07 MIL/uL — ABNORMAL LOW (ref 4.22–5.81)
RDW: 13.4 % (ref 11.5–15.5)
WBC: 8.9 10*3/uL (ref 4.0–10.5)
nRBC: 0 % (ref 0.0–0.2)

## 2019-04-02 LAB — TROPONIN I
Troponin I: 1 ng/mL (ref <0.03)
Troponin I: 2.55 ng/mL (ref <0.03)

## 2019-04-02 LAB — GLUCOSE, CAPILLARY
Glucose-Capillary: 92 mg/dL (ref 70–99)
Glucose-Capillary: 165 mg/dL — ABNORMAL HIGH (ref 70–99)

## 2019-04-02 LAB — POCT ACTIVATED CLOTTING TIME: Activated Clotting Time: 268 seconds

## 2019-04-02 LAB — HEPARIN LEVEL (UNFRACTIONATED): Heparin Unfractionated: 0.31 IU/mL (ref 0.30–0.70)

## 2019-04-02 SURGERY — LEFT HEART CATH AND CORS/GRAFTS ANGIOGRAPHY
Anesthesia: Moderate Sedation

## 2019-04-02 MED ORDER — ONDANSETRON HCL 4 MG PO TABS: 4.0000 mg | ORAL_TABLET | Freq: Four times a day (QID) | ORAL | Status: DC | PRN

## 2019-04-02 MED ORDER — BIVALIRUDIN TRIFLUOROACETATE 250 MG IV SOLR
INTRAVENOUS | Status: AC
  Filled 2019-04-02: qty 250

## 2019-04-02 MED ORDER — ASPIRIN 81 MG PO CHEW
81.0000 mg | CHEWABLE_TABLET | Freq: Every day | ORAL | Status: DC
  Administered 2019-04-03: 08:00:00 81 mg via ORAL
  Filled 2019-04-02: qty 1

## 2019-04-02 MED ORDER — SIMVASTATIN 20 MG PO TABS
40.0000 mg | ORAL_TABLET | Freq: Every day | ORAL | Status: DC
  Administered 2019-04-02: 22:00:00 40 mg via ORAL
  Filled 2019-04-02: qty 2

## 2019-04-02 MED ORDER — ASPIRIN 81 MG PO CHEW
CHEWABLE_TABLET | ORAL | Status: DC | PRN
  Administered 2019-04-02: 81 mg via ORAL

## 2019-04-02 MED ORDER — SODIUM CHLORIDE 0.9 % IV SOLN: 250.0000 mL | INTRAVENOUS | Status: DC | PRN

## 2019-04-02 MED ORDER — ASPIRIN 81 MG PO CHEW
CHEWABLE_TABLET | ORAL | Status: AC
  Filled 2019-04-02: qty 1

## 2019-04-02 MED ORDER — FENTANYL CITRATE (PF) 100 MCG/2ML IJ SOLN
INTRAMUSCULAR | Status: AC
  Filled 2019-04-02: qty 2

## 2019-04-02 MED ORDER — SODIUM CHLORIDE 0.9% FLUSH: 3.0000 mL | INTRAVENOUS | Status: DC | PRN

## 2019-04-02 MED ORDER — SODIUM CHLORIDE 0.9 % WEIGHT BASED INFUSION: 1.0000 mL/kg/h | INTRAVENOUS | Status: DC

## 2019-04-02 MED ORDER — ALPRAZOLAM 0.25 MG PO TABS
0.1250 mg | ORAL_TABLET | Freq: Three times a day (TID) | ORAL | Status: DC | PRN
  Administered 2019-04-02 – 2019-04-03 (×4): 0.125 mg via ORAL
  Filled 2019-04-02 (×4): qty 1

## 2019-04-02 MED ORDER — SODIUM CHLORIDE 0.9 % WEIGHT BASED INFUSION: 3.0000 mL/kg/h | INTRAVENOUS | Status: AC

## 2019-04-02 MED ORDER — SODIUM CHLORIDE 0.9 % WEIGHT BASED INFUSION
1.0000 mL/kg/h | INTRAVENOUS | Status: AC
  Administered 2019-04-02: 1 mL/kg/h via INTRAVENOUS

## 2019-04-02 MED ORDER — FENTANYL CITRATE (PF) 100 MCG/2ML IJ SOLN
INTRAMUSCULAR | Status: DC | PRN
  Administered 2019-04-02 (×2): 50 ug via INTRAVENOUS

## 2019-04-02 MED ORDER — SODIUM CHLORIDE 0.9% FLUSH
3.0000 mL | Freq: Two times a day (BID) | INTRAVENOUS | Status: DC
  Administered 2019-04-02: 3 mL via INTRAVENOUS

## 2019-04-02 MED ORDER — SODIUM CHLORIDE 0.9 % IV SOLN
INTRAVENOUS | Status: AC | PRN
  Administered 2019-04-02: 1.75 mg/kg/h via INTRAVENOUS

## 2019-04-02 MED ORDER — ONDANSETRON HCL 4 MG/2ML IJ SOLN: 4.0000 mg | Freq: Four times a day (QID) | INTRAMUSCULAR | Status: DC | PRN

## 2019-04-02 MED ORDER — MIDAZOLAM HCL 2 MG/2ML IJ SOLN
INTRAMUSCULAR | Status: AC
  Filled 2019-04-02: qty 2

## 2019-04-02 MED ORDER — METOPROLOL TARTRATE 5 MG/5ML IV SOLN
5.0000 mg | Freq: Once | INTRAVENOUS | Status: AC
  Administered 2019-04-02: 03:00:00 5 mg via INTRAVENOUS

## 2019-04-02 MED ORDER — LIDOCAINE HCL (PF) 1 % IJ SOLN
INTRAMUSCULAR | Status: DC | PRN
  Administered 2019-04-02: 20 mL

## 2019-04-02 MED ORDER — NITROGLYCERIN 2 % TD OINT: 1.0000 [in_us] | TOPICAL_OINTMENT | Freq: Four times a day (QID) | TRANSDERMAL | Status: DC | PRN

## 2019-04-02 MED ORDER — INSULIN ASPART 100 UNIT/ML ~~LOC~~ SOLN: 0.0000 [IU] | Freq: Four times a day (QID) | SUBCUTANEOUS | Status: DC

## 2019-04-02 MED ORDER — LABETALOL HCL 5 MG/ML IV SOLN: 10.0000 mg | INTRAVENOUS | Status: AC | PRN

## 2019-04-02 MED ORDER — ACETAMINOPHEN 325 MG PO TABS: 650.0000 mg | ORAL_TABLET | Freq: Four times a day (QID) | ORAL | Status: DC | PRN

## 2019-04-02 MED ORDER — PANTOPRAZOLE SODIUM 40 MG PO TBEC
40.0000 mg | DELAYED_RELEASE_TABLET | Freq: Every day | ORAL | Status: DC
  Administered 2019-04-02 – 2019-04-03 (×2): 40 mg via ORAL
  Filled 2019-04-02 (×2): qty 1

## 2019-04-02 MED ORDER — HEPARIN (PORCINE) IN NACL 1000-0.9 UT/500ML-% IV SOLN
INTRAVENOUS | Status: DC | PRN
  Administered 2019-04-02 (×2): 500 mL

## 2019-04-02 MED ORDER — BIVALIRUDIN BOLUS VIA INFUSION - CUPID
INTRAVENOUS | Status: DC | PRN
  Administered 2019-04-02: 74.175 mg via INTRAVENOUS

## 2019-04-02 MED ORDER — IOHEXOL 300 MG/ML  SOLN
INTRAMUSCULAR | Status: DC | PRN
  Administered 2019-04-02: 190 mL via INTRAVENOUS

## 2019-04-02 MED ORDER — SODIUM CHLORIDE 0.9% FLUSH
3.0000 mL | Freq: Two times a day (BID) | INTRAVENOUS | Status: DC
  Administered 2019-04-03: 3 mL via INTRAVENOUS

## 2019-04-02 MED ORDER — ACETAMINOPHEN 325 MG PO TABS: 650.0000 mg | ORAL_TABLET | ORAL | Status: DC | PRN

## 2019-04-02 MED ORDER — METOPROLOL TARTRATE 5 MG/5ML IV SOLN
INTRAVENOUS | Status: AC
  Filled 2019-04-02: qty 5

## 2019-04-02 MED ORDER — ASPIRIN 81 MG PO CHEW: 81.0000 mg | CHEWABLE_TABLET | ORAL | Status: DC

## 2019-04-02 MED ORDER — CLOPIDOGREL BISULFATE 75 MG PO TABS
ORAL_TABLET | ORAL | Status: DC | PRN
  Administered 2019-04-02: 225 mg via ORAL

## 2019-04-02 MED ORDER — ACETAMINOPHEN 650 MG RE SUPP: 650.0000 mg | Freq: Four times a day (QID) | RECTAL | Status: DC | PRN

## 2019-04-02 MED ORDER — CLOPIDOGREL BISULFATE 75 MG PO TABS
75.0000 mg | ORAL_TABLET | Freq: Every day | ORAL | Status: DC
  Filled 2019-04-02: qty 1

## 2019-04-02 MED ORDER — SODIUM CHLORIDE 0.9% FLUSH: 3.0000 mL | Freq: Two times a day (BID) | INTRAVENOUS | Status: DC

## 2019-04-02 MED ORDER — ALUM & MAG HYDROXIDE-SIMETH 200-200-20 MG/5ML PO SUSP
15.0000 mL | Freq: Three times a day (TID) | ORAL | Status: DC | PRN
  Administered 2019-04-02: 15 mL via ORAL
  Filled 2019-04-02 (×2): qty 30

## 2019-04-02 MED ORDER — MIDAZOLAM HCL 2 MG/2ML IJ SOLN
INTRAMUSCULAR | Status: DC | PRN
  Administered 2019-04-02 (×2): 1 mg via INTRAVENOUS

## 2019-04-02 MED ORDER — CLOPIDOGREL BISULFATE 75 MG PO TABS
ORAL_TABLET | ORAL | Status: AC
  Filled 2019-04-02: qty 3

## 2019-04-02 MED ORDER — CLOPIDOGREL BISULFATE 75 MG PO TABS
75.0000 mg | ORAL_TABLET | Freq: Every day | ORAL | Status: DC
  Administered 2019-04-02 – 2019-04-03 (×2): 75 mg via ORAL
  Filled 2019-04-02 (×2): qty 1

## 2019-04-02 MED ORDER — HEPARIN (PORCINE) IN NACL 1000-0.9 UT/500ML-% IV SOLN
INTRAVENOUS | Status: AC
  Filled 2019-04-02: qty 1000

## 2019-04-02 MED ORDER — INSULIN ASPART 100 UNIT/ML ~~LOC~~ SOLN
0.0000 [IU] | Freq: Three times a day (TID) | SUBCUTANEOUS | Status: DC
  Filled 2019-04-02: qty 1

## 2019-04-02 MED ORDER — HYDRALAZINE HCL 20 MG/ML IJ SOLN: 10.0000 mg | INTRAMUSCULAR | Status: AC | PRN

## 2019-04-02 SURGICAL SUPPLY — 21 items
BALLN TREK RX 2.25X12 (BALLOONS) ×3
BALLOON TREK RX 2.25X12 (BALLOONS) ×1 IMPLANT
CATH ANGIO 5F JB2 100CM (CATHETERS) ×3 IMPLANT
CATH INFINITI 5FR ANG PIGTAIL (CATHETERS) ×3 IMPLANT
CATH INFINITI 5FR JL4 (CATHETERS) ×3 IMPLANT
CATH INFINITI JR4 5F (CATHETERS) ×6 IMPLANT
CATH VISTA GUIDE 6FR XB3.5 (CATHETERS) ×3 IMPLANT
DEVICE CLOSURE MYNXGRIP 6/7F (Vascular Products) ×3 IMPLANT
DEVICE INFLAT 30 PLUS (MISCELLANEOUS) ×3 IMPLANT
GLIDESHEATH SLEND SS 6F .021 (SHEATH) IMPLANT
KIT MANI 3VAL PERCEP (MISCELLANEOUS) ×3 IMPLANT
NEEDLE PERC 18GX7CM (NEEDLE) ×3 IMPLANT
PACK CARDIAC CATH (CUSTOM PROCEDURE TRAY) ×3 IMPLANT
SHEATH AVANTI 5FR X 11CM (SHEATH) ×3 IMPLANT
SHEATH AVANTI 6FR X 11CM (SHEATH) ×3 IMPLANT
STENT RESOLUTE ONYX 2.25X15 (Permanent Stent) ×3 IMPLANT
WIRE ASAHI PROWATER 180CM (WIRE) ×3 IMPLANT
WIRE EMERALD 3MM-J .035X260CM (WIRE) ×3 IMPLANT
WIRE GUIDERIGHT .035X150 (WIRE) ×3 IMPLANT
WIRE HITORQ VERSACORE ST 145CM (WIRE) ×3 IMPLANT
WIRE ROSEN-J .035X260CM (WIRE) IMPLANT

## 2019-04-02 NOTE — ED Notes (Signed)
ED TO INPATIENT HANDOFF REPORT  ED Nurse Name and Phone #: Kathlee Nations [phone]  S Name/Age/Gender Kent [name] 63 y.o. male Room/Bed: ED15A/ED15A  Code Status   Code Status: Full Code  Home/SNF/Other Home Patient oriented to: self Is this baseline? Yes   Triage Complete: Triage complete  Chief Complaint Chest Pain  Triage Note Pt c/o central chest pain with SOB x2 weeks, worse in ,last 24 hours. Pt has extensive cardiac hx. Pt took Asprin and 2 nitroglycerin, with relief but pain gradually came back after 30 minutes. MD made aware.     Allergies Allergies  Allergen Reactions  . Celebrex [Celecoxib] Other (See Comments)    GI bleed  . Nsaids     GI Bleed  . Oxycodone Other (See Comments)    Hallucinations and agitations  . Prednisone Other (See Comments)    Can't take high doses  . Zoloft [Sertraline Hcl] Other (See Comments)    Crazy    Level of Care/Admitting Diagnosis ED Disposition    ED Disposition Condition [facility] Area: [place] [[id number]]  Level of Care: Telemetry [5]  Covid Evaluation: Screening Protocol (No Symptoms)  Diagnosis: Chest pain [[id number]]  Admitting Physician: Lance Coon [[id number]]  Attending Physician: Lance Coon [[id number]]  Estimated length of stay: past midnight tomorrow  Certification:: I certify this patient will need inpatient services for at least 2 midnights  Bed request comments: 2a  PT Class (Do Not Modify): Inpatient [101]  PT Acc Code (Do Not Modify): Private [1]       B Medical/Surgery History Past Medical History:  Diagnosis Date  . Anemia   . Cardiovascular disease   . Diabetes mellitus ([place])    Type II  . Hypertension   . Neuropathy due to secondary diabetes mellitus ([place])   . Osteoarthritis    Past Surgical History:  Procedure Laterality Date  . BILATERAL CARPAL TUNNEL RELEASE     both right and left done two different years  . bilateral leg surgery  2006   Both  legs crushed, total of 6 surgeries at [facility] in 2006  . BYPASS GRAFT  1996   cardiac bypass done in 1996  . colorectal surgery  2000   For colorectal cancer, done by DR. ely and Dr. Sharlet Salina  . CORONARY ANGIOPLASTY WITH STENT PLACEMENT  2014  . CORONARY STENT INTERVENTION N/A 12/05/2017   Procedure: CORONARY STENT INTERVENTION;  Surgeon: Isaias Cowman, MD;  Location: [place] [facility];  Service: Cardiovascular;  Laterality: N/A;  . LEFT HEART CATH AND CORS/GRAFTS ANGIOGRAPHY N/A 12/05/2017   Procedure: LEFT HEART CATH AND CORS/GRAFTS ANGIOGRAPHY;  Surgeon: Isaias Cowman, MD;  Location: [place] [facility];  Service: Cardiovascular;  Laterality: N/A;     A IV Location/Drains/Wounds Patient Lines/Drains/Airways Status   Active Line/Drains/Airways    Name:   Placement date:   Placement time:   Site:   Days:   Peripheral IV 04/01/19 Left Hand   04/01/19    2020    Hand   1   Peripheral IV 04/01/19 Right Forearm   04/01/19    2020    Forearm   1          Intake/Output Last 24 hours No intake or output data in the 24 hours ending 04/02/19 0720  Labs/Imaging Results for orders placed or performed during the hospital encounter of 04/01/19 (from the past 48 hour(s))  Basic metabolic panel     Status:  Abnormal   Collection Time: 04/01/19  8:11 PM  Result Value Ref Range   Sodium 130 (L) 135 - 145 mmol/L   Potassium 3.9 3.5 - 5.1 mmol/L   Chloride 96 (L) 98 - 111 mmol/L   CO2 22 22 - 32 mmol/L   Glucose, Bld 263 (H) 70 - 99 mg/dL   BUN 16 8 - 23 mg/dL   Creatinine, Ser 0.97 0.61 - 1.24 mg/dL   Calcium 8.9 8.9 - 10.3 mg/dL   GFR calc non Af Amer >60 >60 mL/min   GFR calc Af Amer >60 >60 mL/min   Anion gap 12 5 - 15    Comment: Performed at [facility], [address] [place], [place] [address]  CBC     Status: Abnormal   Collection Time: 04/01/19  8:11 PM  Result Value Ref Range   WBC 5.7 4.0 - 10.5 K/uL   RBC 4.20 (L) 4.22 - 5.81 MIL/uL    Hemoglobin 13.0 13.0 - 17.0 g/dL   HCT 37.6 (L) 39.0 - 52.0 %   MCV 89.5 80.0 - 100.0 fL   MCH 31.0 26.0 - 34.0 pg   MCHC 34.6 30.0 - 36.0 g/dL   RDW 13.6 11.5 - 15.5 %   Platelets 157 150 - 400 K/uL   nRBC 0.0 0.0 - 0.2 %    Comment: Performed at [facility], [address] [place], [place] [address]  Troponin I - ONCE - STAT     Status: None   Collection Time: 04/01/19  8:11 PM  Result Value Ref Range   Troponin I <0.03 <0.03 ng/mL    Comment: Performed at [facility], [address] [place], [place] [address]  SARS Coronavirus 2 (CEPHEID - Performed in [place] hospital lab), Hosp Order     Status: None   Collection Time: 04/01/19  8:25 PM  Result Value Ref Range   SARS Coronavirus 2 NEGATIVE NEGATIVE    Comment: (NOTE) If result is NEGATIVE SARS-CoV-2 target nucleic acids are NOT DETECTED. The SARS-CoV-2 RNA is generally detectable in upper and lower  respiratory specimens during the acute phase of infection. The lowest  concentration of SARS-CoV-2 viral copies this assay can detect is 250  copies / mL. A negative result does not preclude SARS-CoV-2 infection  and should not be used as the sole basis for treatment or other  patient management decisions.  A negative result may occur with  improper specimen collection / handling, submission of specimen other  than nasopharyngeal swab, presence of viral mutation(s) within the  areas targeted by this assay, and inadequate number of viral copies  (<250 copies / mL). A negative result must be combined with clinical  observations, patient history, and epidemiological information. If result is POSITIVE SARS-CoV-2 target nucleic acids are DETECTED. The SARS-CoV-2 RNA is generally detectable in upper and lower  respiratory specimens dur ing the acute phase of infection.  Positive  results are indicative of active infection with SARS-CoV-2.  Clinical  correlation with patient history and other diagnostic  information is  necessary to determine patient infection status.  Positive results do  not rule out bacterial infection or co-infection with other viruses. If result is PRESUMPTIVE POSTIVE SARS-CoV-2 nucleic acids MAY BE PRESENT.   A presumptive positive result was obtained on the submitted specimen  and confirmed on repeat testing.  While 2019 novel coronavirus  (SARS-CoV-2) nucleic acids may be present in the submitted sample  additional confirmatory testing may be  necessary for epidemiological  and / or clinical management purposes  to differentiate between  SARS-CoV-2 and other Sarbecovirus currently known to infect humans.  If clinically indicated additional testing with an alternate test  methodology [phone]) is advised. The SARS-CoV-2 RNA is generally  detectable in upper and lower respiratory sp ecimens during the acute  phase of infection. The expected result is Negative. Fact Sheet for Patients:  StrictlyIdeas.no Fact Sheet for Healthcare Providers: BankingDealers.co.za This test is not yet approved or cleared by the [place] FDA and has been authorized for detection and/or diagnosis of SARS-CoV-2 by FDA under an Emergency Use Authorization (EUA).  This EUA will remain in effect (meaning this test can be used) for the duration of the COVID-19 declaration under Section 564(b)(1) of the Act, 21 U.S.C. section 360bbb-3(b)(1), unless the authorization is terminated or revoked sooner. Performed at [facility], [address] [place], [place] [address]   Troponin I - Now Then Q6H     Status: Abnormal   Collection Time: 04/02/19  2:44 AM  Result Value Ref Range   Troponin I 1.00 (HH) <0.03 ng/mL    Comment: CRITICAL RESULT CALLED TO, READ BACK BY AND VERIFIED WITH JANE DALEY AT 0328 ON 04/02/2019 JJB Performed at [facility], [address] [place], [place] [address]   CBC     Status: Abnormal    Collection Time: 04/02/19  2:44 AM  Result Value Ref Range   WBC 8.9 4.0 - 10.5 K/uL   RBC 4.07 (L) 4.22 - 5.81 MIL/uL   Hemoglobin 12.5 (L) 13.0 - 17.0 g/dL   HCT 35.8 (L) 39.0 - 52.0 %   MCV 88.0 80.0 - 100.0 fL   MCH 30.7 26.0 - 34.0 pg   MCHC 34.9 30.0 - 36.0 g/dL   RDW 13.4 11.5 - 15.5 %   Platelets 173 150 - 400 K/uL   nRBC 0.0 0.0 - 0.2 %    Comment: Performed at [facility], [address] [place], [place] [address]  Basic metabolic panel     Status: Abnormal   Collection Time: 04/02/19  2:44 AM  Result Value Ref Range   Sodium 134 (L) 135 - 145 mmol/L   Potassium 3.8 3.5 - 5.1 mmol/L   Chloride 101 98 - 111 mmol/L   CO2 23 22 - 32 mmol/L   Glucose, Bld 125 (H) 70 - 99 mg/dL   BUN 13 8 - 23 mg/dL   Creatinine, Ser 0.63 0.61 - 1.24 mg/dL   Calcium 8.7 (L) 8.9 - 10.3 mg/dL   GFR calc non Af Amer >60 >60 mL/min   GFR calc Af Amer >60 >60 mL/min   Anion gap 10 5 - 15    Comment: Performed at [facility], [address] [place], [place] [address]  Heparin level (unfractionated)     Status: None   Collection Time: 04/02/19  5:59 AM  Result Value Ref Range   Heparin Unfractionated 0.31 0.30 - 0.70 IU/mL    Comment: (NOTE) If heparin results are below expected values, and patient dosage has  been confirmed, suggest follow up testing of antithrombin III levels. Performed at [facility], [address] [place], [place] [address]    Dg Chest Portable 1 View  Result Date: 04/01/2019 CLINICAL DATA:  Chest pain with shortness of breath for 2 weeks, worse in the past day. EXAM: PORTABLE CHEST 1 VIEW COMPARISON:  09/29/2018 FINDINGS: Pacing/defibrillator pads partially obscure the left lung. Sequelae  of CABG are again identified. The cardiomediastinal silhouette is unchanged with normal heart size. No airspace consolidation, edema, pleural effusion, pneumothorax is identified. No acute osseous abnormality is seen. IMPRESSION: No active disease.  Electronically Signed   By: Logan Bores M.D.   On: 04/01/2019 20:37    Pending Labs Unresulted Labs (From admission, onward)    Start     Ordered   04/03/19 0500  CBC  Tomorrow morning,   STAT     04/02/19 0641   04/02/19 1200  Heparin level (unfractionated)  Once-Timed,   STAT     04/02/19 0639   04/02/19 0236  HIV antibody (Routine Testing)  Once,   STAT     04/02/19 0236   04/02/19 0200  Troponin I - Now Then Q6H  Now then every 6 hours,   STAT     04/01/19 2121          Vitals/Pain Today's Vitals   04/02/19 0230 04/02/19 0300 04/02/19 0413 04/02/19 0620  BP: (!) 151/102 (!) 157/94 (!) 142/103   Pulse: (!) 101 96 97   Resp: 19 15 17    Temp:      TempSrc:      SpO2: 95% 92% 97%   Weight:      Height:      PainSc:    4     Isolation Precautions No active isolations  Medications Medications  heparin ADULT infusion 100 units/mL (25000 units/234mL sodium chloride 0.45%) (1,300 Units/hr Intravenous New Bag/Given 04/01/19 2046)  nitroGLYCERIN (NITROGLYN) 2 % ointment 1 inch (has no administration in time range)  simvastatin (ZOCOR) tablet 40 mg (has no administration in time range)  pantoprazole (PROTONIX) EC tablet 40 mg (has no administration in time range)  clopidogrel (PLAVIX) tablet 75 mg (has no administration in time range)  acetaminophen (TYLENOL) tablet 650 mg (has no administration in time range)    Or  acetaminophen (TYLENOL) suppository 650 mg (has no administration in time range)  HYDROcodone-acetaminophen (NORCO) 7.5-325 MG per tablet 0.5 tablet (has no administration in time range)  ondansetron (ZOFRAN) tablet 4 mg (has no administration in time range)    Or  ondansetron (ZOFRAN) injection 4 mg (has no administration in time range)  insulin aspart (novoLOG) injection 0-9 Units (has no administration in time range)  morphine 2 MG/ML injection 2 mg (2 mg Intravenous Given 04/02/19 0249)  ALPRAZolam (XANAX) tablet 0.125 mg (has no administration in time range)   nitroGLYCERIN (NITROGLYN) 2 % ointment 1 inch (1 inch Topical Given 04/01/19 2019)  heparin injection 4,000 Units (4,000 Units Intravenous Given 04/01/19 2021)  nitroGLYCERIN (NITROGLYN) 2 % ointment 1 inch (1 inch Topical Given 04/01/19 2027)  metoprolol tartrate (LOPRESSOR) injection 5 mg (2.5 mg Intravenous Given 04/01/19 2125)  metoprolol tartrate (LOPRESSOR) injection 5 mg (5 mg Intravenous Given 04/02/19 0237)    Mobility walks Low fall risk   Focused Assessments Cardiac Assessment Handoff:  Cardiac Rhythm: Sinus tachycardia Lab Results  Component Value Date   CKTOTAL 447 (H) 04/01/2013   CKMB 29.3 (H) 04/01/2013   TROPONINI 1.00 (HH) 04/02/2019   No results found for: DDIMER Does the Patient currently have chest pain? No     R Recommendations: See Admitting Provider Note  Report given to:   Additional Notes:   Patient is alert and oriented.  I couldn't change the answer above for orientation.

## 2019-04-02 NOTE — Consult Note (Signed)
[facility] Cardiology  CARDIOLOGY CONSULT NOTE  Patient ID: Kent [name] MRN: [id number] DOB/AGE: 65/05/1956 65 y.o.  Admit date: 04/01/2019 Referring Physician Jannifer Franklin Primary Physician Emily Filbert, MD Primary Cardiologist [place] Reason for Consultation Chest pain, elevated troponin  HPI: 63 year old male referred for chest pain and elevated troponin.  The patient has a known history of coronary artery disease, status post CABG x1 with LIMA to LAD 1996, non-STEMI 2014, and multiple stents, with most recent stent to OM1 12/2017, as well as hypertension, hyperlipidemia, type 2 diabetes, and PVCs.  The patient has chronic stable angina.  Yesterday evening while getting dressed to take a walk, the patient developed diffuse chest pressure which initially resolved after nitroglycerin, only to return "with a vengeance" 10 minutes later without radiation, with shortness of breath, palpitations, and diaphoresis.  The patient presented to [facility] ER where ECG revealed sinus tachycardia at a rate of 102 bpm with ST depression in V1 and aVL and ST elevation aVR. The patient received nitropaste with improvement in symptoms. Admission labs notable for initial troponin less than 0.03, followed by 1.00. Currently, the patient denies chest pain.   Review of systems complete and found to be negative unless listed above     Past Medical History:  Diagnosis Date  . Anemia   . Cardiovascular disease   . Diabetes mellitus ([place])    Type II  . Hypertension   . Neuropathy due to secondary diabetes mellitus ([place])   . Osteoarthritis     Past Surgical History:  Procedure Laterality Date  . BILATERAL CARPAL TUNNEL RELEASE     both right and left done two different years  . bilateral leg surgery  2006   Both legs crushed, total of 6 surgeries at [facility] in 2006  . BYPASS GRAFT  1996   cardiac bypass done in 1996  . colorectal surgery  2000   For colorectal cancer, done by DR. ely and Dr. Sharlet Salina  . CORONARY  ANGIOPLASTY WITH STENT PLACEMENT  2014  . CORONARY STENT INTERVENTION N/A 12/05/2017   Procedure: CORONARY STENT INTERVENTION;  Surgeon: Isaias Cowman, MD;  Location: [place] [facility];  Service: Cardiovascular;  Laterality: N/A;  . LEFT HEART CATH AND CORS/GRAFTS ANGIOGRAPHY N/A 12/05/2017   Procedure: LEFT HEART CATH AND CORS/GRAFTS ANGIOGRAPHY;  Surgeon: Isaias Cowman, MD;  Location: [place] [facility];  Service: Cardiovascular;  Laterality: N/A;    (Not in a hospital admission)  Social History   Socioeconomic History  . Marital status: Married    Spouse name: Cristhian Vanhook  . Number of children: 0  . Years of education: Not on file  . Highest education level: Not on file  Occupational History  . Occupation: Hair Development worker, [employer]: Building surveyor FOR SELF EMPLOYED    Comment: Self-Employed  Social Needs  . Financial resource strain: Very hard  . Food insecurity:    Worry: Never true    Inability: Never true  . Transportation needs:    Medical: No    Non-medical: No  Tobacco Use  . Smoking status: Former Research scientist (life sciences)  . Smokeless tobacco: Never Used  Substance and Sexual Activity  . Alcohol use: No    Frequency: Never  . Drug use: No  . Sexual activity: Yes  Lifestyle  . Physical activity:    Days per week: 7 days    Minutes per session: 10 min  . Stress: Only a little  Relationships  . Social connections:    Talks  on phone: More than three times a week    Gets together: More than three times a week    Attends religious service: 1 to 4 times per year    Active member of club or organization: No    Attends meetings of clubs or organizations: Never    Relationship status: Married  . Intimate partner violence:    Fear of current or ex partner: Not on file    Emotionally abused: Not on file    Physically abused: Not on file    Forced sexual activity: Not on file  Other Topics Concern  . Not on file  Social History Narrative  . Not on file    Family  History  Problem Relation Age of Onset  . Cancer Mother   . Heart disease Mother   . Heart disease Father        Wagner's Disease  . Cancer Father   . Fibromyalgia Sister   . Heart disease Brother   . Heart disease Brother       Review of systems complete and found to be negative unless listed above      PHYSICAL EXAM  General: Well developed, well nourished, in no acute distress HEENT:  Normocephalic and atramatic Neck:  No JVD.  Lungs: Clear bilaterally to auscultation. Normal effort of breathing on room air. Heart: HRRR . Normal S1 and S2 without gallops or murmurs.  Abdomen: nondistended Msk:  Back normal, gait not assessed. Normal strength and tone for age. Extremities: No clubbing, cyanosis or edema.   Neuro: Alert and oriented X 3. Psych:  Good affect, responds appropriately  Labs:   Lab Results  Component Value Date   WBC 8.9 04/02/2019   HGB 12.5 (L) 04/02/2019   HCT 35.8 (L) 04/02/2019   MCV 88.0 04/02/2019   PLT 173 04/02/2019    Recent Labs  Lab 04/02/19 0244  NA 134*  K 3.8  CL 101  CO2 23  BUN 13  CREATININE 0.63  CALCIUM 8.7*  GLUCOSE 125*   Lab Results  Component Value Date   CKTOTAL 447 (H) 04/01/2013   CKMB 29.3 (H) 04/01/2013   TROPONINI 1.00 (HH) 04/02/2019    Lab Results  Component Value Date   CHOL 124 03/30/2013   Lab Results  Component Value Date   HDL 41 03/30/2013   Lab Results  Component Value Date   LDLCALC 60 03/30/2013   Lab Results  Component Value Date   TRIG 115 03/30/2013   No results found for: CHOLHDL No results found for: LDLDIRECT    Radiology: Dg Chest Portable 1 View  Result Date: 04/01/2019 CLINICAL DATA:  Chest pain with shortness of breath for 2 weeks, worse in the past day. EXAM: PORTABLE CHEST 1 VIEW COMPARISON:  09/29/2018 FINDINGS: Pacing/defibrillator pads partially obscure the left lung. 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 The cardiomediastinal silhouette is unchanged with normal heart  size. No airspace consolidation, edema, pleural effusion, pneumothorax is identified. No acute osseous abnormality is seen. IMPRESSION: No active disease. Electronically Signed   By: Logan Bores M.D.   On: 04/01/2019 20:37    EKG: sinus rhythm  ASSESSMENT AND PLAN:  1. NSTEMI, with anterior ST depression, with second troponin elevated at 1.00, chest pain resolved with nitropaste. 2. Coronary artery disease status post CABG x 1, NSTEMI, and multiple stents, with most recent in 12/2017. 3. Hypertension, on lisinopril and carvedilol 4. Hyperlipidemia, on simvastatin 5. Type 2 diabetes  Recommendations: 1. Continue heparin  drip 2. The risks and benefits of cardiac catheterization with potential PCI were discussed with the patient, and he agreed to proceed. Plan for cardiac catheterization this afternoon. Remain NPO. 3. Continue aspirin, Plavix, simvastatin, carvedilol 4. 2D echocardiogram 5. Further recommendations pending patient's initial course and findings of cardiac cath.  Signed: Clabe Seal PA-C 04/02/2019, 8:14 AM

## 2019-04-02 NOTE — ED Notes (Signed)
ED TO INPATIENT HANDOFF REPORT  ED Nurse Name and Phone #: [phone]  S Name/Age/Gender Kent [name] 63 y.o. male Room/Bed: ED15A/ED15A  Code Status   Code Status: Full Code  Home/SNF/Other Home Patient oriented to: situation Is this baseline? Yes   Triage Complete: Triage complete  Chief Complaint Chest Pain  Triage Note Pt c/o central chest pain with SOB x2 weeks, worse in ,last 24 hours. Pt has extensive cardiac hx. Pt took Asprin and 2 nitroglycerin, with relief but pain gradually came back after 30 minutes. MD made aware.     Allergies Allergies  Allergen Reactions  . Celebrex [Celecoxib] Other (See Comments)    GI bleed  . Nsaids     GI Bleed  . Oxycodone Other (See Comments)    Hallucinations and agitations  . Prednisone Other (See Comments)    Can't take high doses  . Zoloft [Sertraline Hcl] Other (See Comments)    Crazy    Level of Care/Admitting Diagnosis ED Disposition    ED Disposition Condition [facility]: [place] [[id number]]  Level of Care: Telemetry [5]  Covid Evaluation: Screening Protocol (No Symptoms)  Diagnosis: Chest pain [[id number]]  Admitting Physician: Lance Coon [[id number]]  Attending Physician: Lance Coon [[id number]]  Estimated length of stay: past midnight tomorrow  Certification:: I certify this patient will need inpatient services for at least 2 midnights  Bed request comments: 2a  PT Class (Do Not Modify): Inpatient [101]  PT Acc Code (Do Not Modify): Private [1]       B Medical/Surgery History Past Medical History:  Diagnosis Date  . Anemia   . Cardiovascular disease   . Diabetes mellitus ([place])    Type II  . Hypertension   . Neuropathy due to secondary diabetes mellitus ([place])   . Osteoarthritis    Past Surgical History:  Procedure Laterality Date  . BILATERAL CARPAL TUNNEL RELEASE     both right and left done two different years  . bilateral leg surgery  2006   Both  legs crushed, total of 6 surgeries at [facility] in 2006  . BYPASS GRAFT  1996   cardiac bypass done in 1996  . colorectal surgery  2000   For colorectal cancer, done by DR. ely and Dr. Sharlet Salina  . CORONARY ANGIOPLASTY WITH STENT PLACEMENT  2014  . CORONARY STENT INTERVENTION N/A 12/05/2017   Procedure: CORONARY STENT INTERVENTION;  Surgeon: Isaias Cowman, MD;  Location: [facility];  Service: Cardiovascular;  Laterality: N/A;  . LEFT HEART CATH AND CORS/GRAFTS ANGIOGRAPHY N/A 12/05/2017   Procedure: LEFT HEART CATH AND CORS/GRAFTS ANGIOGRAPHY;  Surgeon: Isaias Cowman, MD;  Location: [facility];  Service: Cardiovascular;  Laterality: N/A;     A IV Location/Drains/Wounds Patient Lines/Drains/Airways Status   Active Line/Drains/Airways    Name:   Placement date:   Placement time:   Site:   Days:   Peripheral IV 04/01/19 Left Hand   04/01/19    2020    Hand   1   Peripheral IV 04/01/19 Right Forearm   04/01/19    2020    Forearm   1          Intake/Output Last 24 hours No intake or output data in the 24 hours ending 04/02/19 2505  Labs/Imaging Results for orders placed or performed during the hospital encounter of 04/01/19 (from the past 48 hour(s))  Basic metabolic panel     Status:  Abnormal   Collection Time: 04/01/19  8:11 PM  Result Value Ref Range   Sodium 130 (L) 135 - 145 mmol/L   Potassium 3.9 3.5 - 5.1 mmol/L   Chloride 96 (L) 98 - 111 mmol/L   CO2 22 22 - 32 mmol/L   Glucose, Bld 263 (H) 70 - 99 mg/dL   BUN 16 8 - 23 mg/dL   Creatinine, Ser 0.97 0.61 - 1.24 mg/dL   Calcium 8.9 8.9 - 10.3 mg/dL   GFR calc non Af Amer >60 >60 mL/min   GFR calc Af Amer >60 >60 mL/min   Anion gap 12 5 - 15    Comment: Performed at [facility], [address] [place], [place] [address]  CBC     Status: Abnormal   Collection Time: 04/01/19  8:11 PM  Result Value Ref Range   WBC 5.7 4.0 - 10.5 K/uL   RBC 4.20 (L) 4.22 - 5.81 MIL/uL    Hemoglobin 13.0 13.0 - 17.0 g/dL   HCT 37.6 (L) 39.0 - 52.0 %   MCV 89.5 80.0 - 100.0 fL   MCH 31.0 26.0 - 34.0 pg   MCHC 34.6 30.0 - 36.0 g/dL   RDW 13.6 11.5 - 15.5 %   Platelets 157 150 - 400 K/uL   nRBC 0.0 0.0 - 0.2 %    Comment: Performed at [facility], [address] [place], [place] [address]  Troponin I - ONCE - STAT     Status: None   Collection Time: 04/01/19  8:11 PM  Result Value Ref Range   Troponin I <0.03 <0.03 ng/mL    Comment: Performed at [facility], [address] [place], [place] [address]  SARS Coronavirus 2 (CEPHEID - Performed in [place] hospital lab), Hosp Order     Status: None   Collection Time: 04/01/19  8:25 PM  Result Value Ref Range   SARS Coronavirus 2 NEGATIVE NEGATIVE    Comment: (NOTE) If result is NEGATIVE SARS-CoV-2 target nucleic acids are NOT DETECTED. The SARS-CoV-2 RNA is generally detectable in upper and lower  respiratory specimens during the acute phase of infection. The lowest  concentration of SARS-CoV-2 viral copies this assay can detect is 250  copies / mL. A negative result does not preclude SARS-CoV-2 infection  and should not be used as the sole basis for treatment or other  patient management decisions.  A negative result may occur with  improper specimen collection / handling, submission of specimen other  than nasopharyngeal swab, presence of viral mutation(s) within the  areas targeted by this assay, and inadequate number of viral copies  (<250 copies / mL). A negative result must be combined with clinical  observations, patient history, and epidemiological information. If result is POSITIVE SARS-CoV-2 target nucleic acids are DETECTED. The SARS-CoV-2 RNA is generally detectable in upper and lower  respiratory specimens dur ing the acute phase of infection.  Positive  results are indicative of active infection with SARS-CoV-2.  Clinical  correlation with patient history and other diagnostic  information is  necessary to determine patient infection status.  Positive results do  not rule out bacterial infection or co-infection with other viruses. If result is PRESUMPTIVE POSTIVE SARS-CoV-2 nucleic acids MAY BE PRESENT.   A presumptive positive result was obtained on the submitted specimen  and confirmed on repeat testing.  While 2019 novel coronavirus  (SARS-CoV-2) nucleic acids may be present in the submitted sample  additional confirmatory testing may be  necessary for epidemiological  and / or clinical management purposes  to differentiate between  SARS-CoV-2 and other Sarbecovirus currently known to infect humans.  If clinically indicated additional testing with an alternate test  methodology [phone]) is advised. The SARS-CoV-2 RNA is generally  detectable in upper and lower respiratory sp ecimens during the acute  phase of infection. The expected result is Negative. Fact Sheet for Patients:  StrictlyIdeas.no Fact Sheet for Healthcare Providers: BankingDealers.co.za This test is not yet approved or cleared by the [place] FDA and has been authorized for detection and/or diagnosis of SARS-CoV-2 by FDA under an Emergency Use Authorization (EUA).  This EUA will remain in effect (meaning this test can be used) for the duration of the COVID-19 declaration under Section 564(b)(1) of the Act, 21 U.S.C. section 360bbb-3(b)(1), unless the authorization is terminated or revoked sooner. Performed at [facility], [address] [place], [place] [address]   Troponin I - Now Then Q6H     Status: Abnormal   Collection Time: 04/02/19  2:44 AM  Result Value Ref Range   Troponin I 1.00 (HH) <0.03 ng/mL    Comment: CRITICAL RESULT CALLED TO, READ BACK BY AND VERIFIED WITH JANE DALEY AT 0328 ON 04/02/2019 JJB Performed at [facility], [address] [place], [place] [address]   CBC     Status: Abnormal    Collection Time: 04/02/19  2:44 AM  Result Value Ref Range   WBC 8.9 4.0 - 10.5 K/uL   RBC 4.07 (L) 4.22 - 5.81 MIL/uL   Hemoglobin 12.5 (L) 13.0 - 17.0 g/dL   HCT 35.8 (L) 39.0 - 52.0 %   MCV 88.0 80.0 - 100.0 fL   MCH 30.7 26.0 - 34.0 pg   MCHC 34.9 30.0 - 36.0 g/dL   RDW 13.4 11.5 - 15.5 %   Platelets 173 150 - 400 K/uL   nRBC 0.0 0.0 - 0.2 %    Comment: Performed at [facility], [address] [place], [place] [address]  Basic metabolic panel     Status: Abnormal   Collection Time: 04/02/19  2:44 AM  Result Value Ref Range   Sodium 134 (L) 135 - 145 mmol/L   Potassium 3.8 3.5 - 5.1 mmol/L   Chloride 101 98 - 111 mmol/L   CO2 23 22 - 32 mmol/L   Glucose, Bld 125 (H) 70 - 99 mg/dL   BUN 13 8 - 23 mg/dL   Creatinine, Ser 0.63 0.61 - 1.24 mg/dL   Calcium 8.7 (L) 8.9 - 10.3 mg/dL   GFR calc non Af Amer >60 >60 mL/min   GFR calc Af Amer >60 >60 mL/min   Anion gap 10 5 - 15    Comment: Performed at [facility], [address] [place], [place] [address]  Heparin level (unfractionated)     Status: None   Collection Time: 04/02/19  5:59 AM  Result Value Ref Range   Heparin Unfractionated 0.31 0.30 - 0.70 IU/mL    Comment: (NOTE) If heparin results are below expected values, and patient dosage has  been confirmed, suggest follow up testing of antithrombin III levels. Performed at [facility], [address] [place], [place] [address]    Dg Chest Portable 1 View  Result Date: 04/01/2019 CLINICAL DATA:  Chest pain with shortness of breath for 2 weeks, worse in the past day. EXAM: PORTABLE CHEST 1 VIEW COMPARISON:  09/29/2018 FINDINGS: Pacing/defibrillator pads partially obscure the left lung. Sequelae  of CABG are again identified. The cardiomediastinal silhouette is unchanged with normal heart size. No airspace consolidation, edema, pleural effusion, pneumothorax is identified. No acute osseous abnormality is seen. IMPRESSION: No active disease.  Electronically Signed   By: Logan Bores M.D.   On: 04/01/2019 20:37    Pending Labs Unresulted Labs (From admission, onward)    Start     Ordered   04/03/19 0500  CBC  Tomorrow morning,   STAT     04/02/19 0641   04/02/19 1200  Heparin level (unfractionated)  Once-Timed,   STAT     04/02/19 0639   04/02/19 0236  HIV antibody (Routine Testing)  Once,   STAT     04/02/19 0236   04/02/19 0200  Troponin I - Now Then Q6H  Now then every 6 hours,   STAT     04/01/19 2121          Vitals/Pain Today's Vitals   04/02/19 0500 04/02/19 0530 04/02/19 0600 04/02/19 0620  BP: (!) 134/93 (!) 142/91 (!) 140/97   Pulse: 92 93 92   Resp: 20 16 16    Temp:      TempSrc:      SpO2: 93% 94% 98%   Weight:      Height:      PainSc:    4     Isolation Precautions No active isolations  Medications Medications  heparin ADULT infusion 100 units/mL (25000 units/225mL sodium chloride 0.45%) (1,300 Units/hr Intravenous New Bag/Given 04/01/19 2046)  nitroGLYCERIN (NITROGLYN) 2 % ointment 1 inch (has no administration in time range)  simvastatin (ZOCOR) tablet 40 mg (has no administration in time range)  pantoprazole (PROTONIX) EC tablet 40 mg (has no administration in time range)  clopidogrel (PLAVIX) tablet 75 mg (has no administration in time range)  acetaminophen (TYLENOL) tablet 650 mg (has no administration in time range)    Or  acetaminophen (TYLENOL) suppository 650 mg (has no administration in time range)  HYDROcodone-acetaminophen (NORCO) 7.5-325 MG per tablet 0.5 tablet (has no administration in time range)  ondansetron (ZOFRAN) tablet 4 mg (has no administration in time range)    Or  ondansetron (ZOFRAN) injection 4 mg (has no administration in time range)  insulin aspart (novoLOG) injection 0-9 Units (has no administration in time range)  morphine 2 MG/ML injection 2 mg (2 mg Intravenous Given 04/02/19 0249)  ALPRAZolam (XANAX) tablet 0.125 mg (has no administration in time range)   nitroGLYCERIN (NITROGLYN) 2 % ointment 1 inch (1 inch Topical Given 04/01/19 2019)  heparin injection 4,000 Units (4,000 Units Intravenous Given 04/01/19 2021)  nitroGLYCERIN (NITROGLYN) 2 % ointment 1 inch (1 inch Topical Given 04/01/19 2027)  metoprolol tartrate (LOPRESSOR) injection 5 mg (2.5 mg Intravenous Given 04/01/19 2125)  metoprolol tartrate (LOPRESSOR) injection 5 mg (5 mg Intravenous Given 04/02/19 0237)    Mobility walks Low fall risk   Focused Assessments Cardiac Assessment Handoff:  Cardiac Rhythm: Sinus tachycardia Lab Results  Component Value Date   CKTOTAL 447 (H) 04/01/2013   CKMB 29.3 (H) 04/01/2013   TROPONINI 1.00 (HH) 04/02/2019   No results found for: DDIMER Does the Patient currently have chest pain? No     R Recommendations: See Admitting Provider Note  Report given to:   Additional Notes:

## 2019-04-02 NOTE — Plan of Care (Signed)
?  Problem: Education: ?Goal: Knowledge of General Education information will improve ?Description: Including pain rating scale, medication(s)/side effects and non-pharmacologic comfort measures ?Outcome: Progressing ?  ?Problem: Health Behavior/Discharge Planning: ?Goal: Ability to manage health-related needs will improve ?Outcome: Progressing ?  ?Problem: Clinical Measurements: ?Goal: Ability to maintain clinical measurements within normal limits will improve ?Outcome: Progressing ?  ?Problem: Clinical Measurements: ?Goal: Cardiovascular complication will be avoided ?Outcome: Progressing ?  ?Problem: Activity: ?Goal: Risk for activity intolerance will decrease ?Outcome: Progressing ?  ?

## 2019-04-02 NOTE — Progress Notes (Signed)
ANTICOAGULATION CONSULT NOTE - Initial Consult  Pharmacy Consult for Heparin  Indication: chest pain/ACS  Allergies  Allergen Reactions  . Celebrex [Celecoxib] Other (See Comments)    GI bleed  . Nsaids     GI Bleed  . Oxycodone Other (See Comments)    Hallucinations and agitations  . Prednisone Other (See Comments)    Can't take high doses  . Zoloft [Sertraline Hcl] Other (See Comments)    Crazy    Patient Measurements: Height: 6' (182.9 cm) Weight: 218 lb (98.9 kg) IBW/kg (Calculated) : 77.6 Heparin Dosing Weight:  97.6 kg   Vital Signs: Temp: 98 F (36.7 C) (06/01 2009) Temp Source: Oral (06/01 2009) BP: 142/103 (06/02 0413) Pulse Rate: 97 (06/02 0413)  Labs: Recent Labs    04/01/19 2011 04/02/19 0244 04/02/19 0559  HGB 13.0 12.5*  --   HCT 37.6* 35.8*  --   PLT 157 173  --   HEPARINUNFRC  --   --  0.31  CREATININE 0.97 0.63  --   TROPONINI <0.03 1.00*  --     Estimated Creatinine Clearance: 112.1 mL/min (by C-G formula based on SCr of 0.63 mg/dL).   Medical History: Past Medical History:  Diagnosis Date  . Anemia   . Cardiovascular disease   . Diabetes mellitus ([place])    Type II  . Hypertension   . 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 Osteoarthritis     Medications:  (Not in a hospital admission)   Assessment: Pharmacy consulted to dose heparin in this 63 year old male admitted with ACS/NSTEMI.  No prior anticoag noted.  CrCl =   Goal of Therapy:  Heparin level 0.3-0.7 units/ml Monitor platelets by anticoagulation protocol: Yes   Plan:  06/02 @ 0600 HL 0.31 therapeutic. Will continue current rate and will recheck HL @ 1200. CBC has been trending normal will continue to monitor.  Tobie Lords, PharmD, BCPS Clinical Pharmacist 04/02/2019,6:39 AM

## 2019-04-02 NOTE — Progress Notes (Signed)
[name] at [facility]                                                                                                                                                                                  Patient Demographics   Kent [name], is a 63 y.o. male, DOB - 1955/12/05, VQM:[id number]  Admit date - 04/01/2019   Admitting Physician Lance Coon, MD  Outpatient Primary MD for the patient is Rusty Aus, MD   LOS - 1  Subjective: Patient admitted with chest pain.  Has had multiple stents. This morning states that he is having some chest pain   Review of Systems:   CONSTITUTIONAL: No documented fever. No fatigue, weakness. No weight gain, no weight loss.  EYES: No blurry or double vision.  ENT: No tinnitus. No postnasal drip. No redness of the oropharynx.  RESPIRATORY: No cough, no wheeze, no hemoptysis.  Positive dyspnea.  CARDIOVASCULAR: Positive chest pain. No orthopnea. No palpitations. No syncope.  GASTROINTESTINAL: No nausea, no vomiting or diarrhea. No abdominal pain. No melena or hematochezia.  GENITOURINARY: No dysuria or hematuria.  ENDOCRINE: No polyuria or nocturia. No heat or cold intolerance.  HEMATOLOGY: No anemia. No bruising. No bleeding.  INTEGUMENTARY: No rashes. No lesions.  MUSCULOSKELETAL: No arthritis. No swelling. No gout.  NEUROLOGIC: No numbness, tingling, or ataxia. No seizure-type activity.  PSYCHIATRIC: No anxiety. No insomnia. No ADD.    Vitals:   Vitals:   04/02/19 0600 04/02/19 0911 04/02/19 1207 04/02/19 1339  BP: (!) 140/97 126/90 (!) 145/93   Pulse: 92 91 88   Resp: 16 19 14    Temp:  98.5 F (36.9 C) 97.9 F (36.6 C)   TempSrc:  Oral Oral   SpO2: 98% 98% 99% 99%  Weight:      Height:        Wt Readings from Last 3 Encounters:  04/01/19 98.9 kg  09/29/18 104.8 kg  12/06/17 111 kg    No intake or output data in the 24 hours ending 04/02/19 1454  Physical Exam:   GENERAL: Pleasant-appearing in no  apparent distress.  HEAD, EYES, EARS, NOSE AND THROAT: Atraumatic, normocephalic. Extraocular muscles are intact. Pupils equal and reactive to light. Sclerae anicteric. No conjunctival injection. No oro-pharyngeal erythema.  NECK: Supple. There is no jugular venous distention. No bruits, no lymphadenopathy, no thyromegaly.  HEART: Regular rate and rhythm,. No murmurs, no rubs, no clicks.  LUNGS: Clear to auscultation bilaterally. No rales or rhonchi. No wheezes.  ABDOMEN: Soft, flat, nontender, nondistended. Has good bowel sounds. No hepatosplenomegaly appreciated.  EXTREMITIES: No evidence of any cyanosis, clubbing, or peripheral edema.  +2 pedal and radial  pulses bilaterally.  NEUROLOGIC: The patient is alert, awake, and oriented x3 with no focal motor or sensory deficits appreciated bilaterally.  SKIN: Moist and warm with no rashes appreciated.  Psych: Not anxious, depressed LN: No inguinal LN enlargement    Antibiotics   Anti-infectives (From admission, onward)   None      Medications   Scheduled Meds: . [START ON 04/03/2019] aspirin  81 mg Oral Pre-Cath  . [MAR Hold] clopidogrel  75 mg Oral Daily  . [MAR Hold] insulin aspart  0-9 Units Subcutaneous Q6H  . [MAR Hold] pantoprazole  40 mg Oral Daily  . [MAR Hold] simvastatin  40 mg Oral QHS  . [MAR Hold] sodium chloride flush  3 mL Intravenous Q12H  . [MAR Hold] sodium chloride flush  3 mL Intravenous Q12H   Continuous Infusions: . sodium chloride    . sodium chloride    . bivalirudin (ANGIOMAX) infusion 5 mg/mL 1.75 mg/kg/hr (04/02/19 1450)  . heparin 1,300 Units/hr (04/01/19 2046)   PRN Meds:.sodium chloride, [MAR Hold] acetaminophen **OR** [MAR Hold] acetaminophen, [MAR Hold] ALPRAZolam, aspirin, bivalirudin (ANGIOMAX) infusion 5 mg/mL, bivalirudin, clopidogrel, fentaNYL, Heparin (Porcine) in NaCl, [MAR Hold] HYDROcodone-acetaminophen, lidocaine (PF), midazolam, [MAR Hold]  morphine injection, [MAR Hold] nitroGLYCERIN, [MAR  Hold] ondansetron **OR** [MAR Hold] ondansetron (ZOFRAN) IV, sodium chloride flush   Data Review:   Micro Results Recent Results (from the past 240 hour(s))  SARS Coronavirus 2 (CEPHEID - Performed in [place] hospital lab), Hosp Order     Status: None   Collection Time: 04/01/19  8:25 PM  Result Value Ref Range Status   SARS Coronavirus 2 NEGATIVE NEGATIVE Final    Comment: (NOTE) If result is NEGATIVE SARS-CoV-2 target nucleic acids are NOT DETECTED. The SARS-CoV-2 RNA is generally detectable in upper and lower  respiratory specimens during the acute phase of infection. The lowest  concentration of SARS-CoV-2 viral copies this assay can detect is 250  copies / mL. A negative result does not preclude SARS-CoV-2 infection  and should not be used as the sole basis for treatment or other  patient management decisions.  A negative result may occur with  improper specimen collection / handling, submission of specimen other  than nasopharyngeal swab, presence of viral mutation(s) within the  areas targeted by this assay, and inadequate number of viral copies  (<250 copies / mL). A negative result must be combined with clinical  observations, patient history, and epidemiological information. If result is POSITIVE SARS-CoV-2 target nucleic acids are DETECTED. The SARS-CoV-2 RNA is generally detectable in upper and lower  respiratory specimens dur ing the acute phase of infection.  Positive  results are indicative of active infection with SARS-CoV-2.  Clinical  correlation with patient history and other diagnostic information is  necessary to determine patient infection status.  Positive results do  not rule out bacterial infection or co-infection with other viruses. If result is PRESUMPTIVE POSTIVE SARS-CoV-2 nucleic acids MAY BE PRESENT.   A presumptive positive result was obtained on the submitted specimen  and confirmed on repeat testing.  While 2019 novel coronavirus   (SARS-CoV-2) nucleic acids may be present in the submitted sample  additional confirmatory testing may be necessary for epidemiological  and / or clinical management purposes  to differentiate between  SARS-CoV-2 and other Sarbecovirus currently known to infect humans.  If clinically indicated additional testing with an alternate test  methodology [phone]) is advised. The SARS-CoV-2 RNA is generally  detectable in upper and lower respiratory sp  ecimens during the acute  phase of infection. The expected result is Negative. Fact Sheet for Patients:  StrictlyIdeas.no Fact Sheet for Healthcare Providers: BankingDealers.co.za This test is not yet approved or cleared by the [place] FDA and has been authorized for detection and/or diagnosis of SARS-CoV-2 by FDA under an Emergency Use Authorization (EUA).  This EUA will remain in effect (meaning this test can be used) for the duration of the COVID-19 declaration under Section 564(b)(1) of the Act, 21 U.S.C. section 360bbb-3(b)(1), unless the authorization is terminated or revoked sooner. Performed at [facility], [address] [place], [place] [address]     Radiology Reports Dg Chest Portable 1 View  Result Date: 04/01/2019 CLINICAL DATA:  Chest pain with shortness of breath for 2 weeks, worse in the past day. EXAM: PORTABLE CHEST 1 VIEW COMPARISON:  09/29/2018 FINDINGS: Pacing/defibrillator pads partially obscure the left lung. 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 The cardiomediastinal silhouette is unchanged with normal heart size. No airspace consolidation, edema, pleural effusion, pneumothorax is identified. No acute osseous abnormality is seen. IMPRESSION: No active disease. Electronically Signed   By: Logan Bores M.D.   On: 04/01/2019 20:37     CBC Recent Labs  Lab 04/01/19 2011 04/02/19 0244  WBC 5.7 8.9  HGB 13.0 12.5*  HCT 37.6* 35.8*  PLT 157 173  MCV 89.5  88.0  MCH 31.0 30.7  MCHC 34.6 34.9  RDW 13.6 13.4    Chemistries  Recent Labs  Lab 04/01/19 2011 04/02/19 0244  NA 130* 134*  K 3.9 3.8  CL 96* 101  CO2 22 23  GLUCOSE 263* 125*  BUN 16 13  CREATININE 0.97 0.63  CALCIUM 8.9 8.7*   ------------------------------------------------------------------------------------------------------------------ estimated creatinine clearance is 112.1 mL/min (by C-G formula based on SCr of 0.63 mg/dL). ------------------------------------------------------------------------------------------------------------------ No results for input(s): HGBA1C in the last 72 hours. ------------------------------------------------------------------------------------------------------------------ No results for input(s): CHOL, HDL, LDLCALC, TRIG, CHOLHDL, LDLDIRECT in the last 72 hours. ------------------------------------------------------------------------------------------------------------------ No results for input(s): TSH, T4TOTAL, T3FREE, THYROIDAB in the last 72 hours.  Invalid input(s): FREET3 ------------------------------------------------------------------------------------------------------------------ No results for input(s): VITAMINB12, FOLATE, FERRITIN, TIBC, IRON, RETICCTPCT in the last 72 hours.  Coagulation profile No results for input(s): INR, PROTIME in the last 168 hours.  No results for input(s): DDIMER in the last 72 hours.  Cardiac Enzymes Recent Labs  Lab 04/01/19 2011 04/02/19 0244 04/02/19 0939  TROPONINI <0.03 1.00* 2.55*   ------------------------------------------------------------------------------------------------------------------ Invalid input(s): POCBNP    Assessment & Plan  Patient 63 year old presenting with chest pain  Non-ST MI Plan for cardiac catheterization today Continue beta-blockers Continue aspirin  CAD (coronary artery disease) -continue home meds, other work-up as above  Diabetes (HCC)  -sliding scale insulin coverage  HTN (hypertension) -home dose antihypertensives  Hyperlipidemia continue simvastatin       Code Status Orders  (From admission, onward)         Start     Ordered   04/02/19 0236  Full code  Continuous     04/02/19 0236        Code Status History    Date Active Date Inactive Code Status Order ID Comments User Context   12/05/2017 0929 12/06/2017 1422 Full Code [id number]  Isaias Cowman, MD Inpatient           Consults cardiology  DVT Prophylaxis Heparin Lab Results  Component Value Date   PLT 173 04/02/2019     Time Spent in minutes   *39min  Greater than 50% of time  spent in care coordination and counseling patient regarding the condition and plan of care.   Dustin Flock M.D on 04/02/2019 at 2:54 PM  Between 7am to 6pm - Pager - [phone]  After 6pm go to [email or web address] - Proofreader  [employer]   Office  [phone]

## 2019-04-03 ENCOUNTER — Inpatient Hospital Stay: Admit: 2019-04-03 | Payer: Medicare Other

## 2019-04-03 ENCOUNTER — Encounter: Payer: Self-pay | Admitting: Cardiology

## 2019-04-03 LAB — CBC
HCT: 38.3 % — ABNORMAL LOW (ref 39.0–52.0)
Hemoglobin: 13 g/dL (ref 13.0–17.0)
MCH: 30.8 pg (ref 26.0–34.0)
MCHC: 33.9 g/dL (ref 30.0–36.0)
MCV: 90.8 fL (ref 80.0–100.0)
Platelets: 164 10*3/uL (ref 150–400)
RBC: 4.22 MIL/uL (ref 4.22–5.81)
RDW: 14 % (ref 11.5–15.5)
WBC: 9.5 10*3/uL (ref 4.0–10.5)
nRBC: 0 % (ref 0.0–0.2)

## 2019-04-03 LAB — BASIC METABOLIC PANEL
Anion gap: 10 (ref 5–15)
BUN: 10 mg/dL (ref 8–23)
CO2: 26 mmol/L (ref 22–32)
Calcium: 8.8 mg/dL — ABNORMAL LOW (ref 8.9–10.3)
Chloride: 103 mmol/L (ref 98–111)
Creatinine, Ser: 0.55 mg/dL — ABNORMAL LOW (ref 0.61–1.24)
GFR calc Af Amer: >60 mL/min (ref >60)
GFR calc non Af Amer: >60 mL/min (ref >60)
Glucose, Bld: 96 mg/dL (ref 70–99)
Potassium: 3.5 mmol/L (ref 3.5–5.1)
Sodium: 139 mmol/L (ref 135–145)

## 2019-04-03 LAB — HIV ANTIBODY (ROUTINE TESTING W REFLEX): HIV Screen 4th Generation wRfx: NONREACTIVE

## 2019-04-03 LAB — GLUCOSE, CAPILLARY: Glucose-Capillary: 82 mg/dL (ref 70–99)

## 2019-04-03 MED ORDER — GABAPENTIN 300 MG PO CAPS: 300.0000 mg | ORAL_CAPSULE | Freq: Two times a day (BID) | ORAL | Status: DC

## 2019-04-03 MED ORDER — FERROUS SULFATE 325 (65 FE) MG PO TABS
325.0000 mg | ORAL_TABLET | Freq: Two times a day (BID) | ORAL | Status: DC
  Administered 2019-04-03: 325 mg via ORAL
  Filled 2019-04-03: qty 1

## 2019-04-03 MED ORDER — CARVEDILOL 25 MG PO TABS
25.0000 mg | ORAL_TABLET | Freq: Four times a day (QID) | ORAL | Status: DC
  Administered 2019-04-03: 25 mg via ORAL
  Filled 2019-04-03: qty 1

## 2019-04-03 MED ORDER — ISOSORBIDE MONONITRATE ER 30 MG PO TB24
30.0000 mg | ORAL_TABLET | Freq: Every day | ORAL | Status: DC
  Administered 2019-04-03: 30 mg via ORAL
  Filled 2019-04-03: qty 1

## 2019-04-03 MED ORDER — METHOCARBAMOL 500 MG PO TABS
500.0000 mg | ORAL_TABLET | Freq: Every day | ORAL | Status: DC
  Filled 2019-04-03: qty 1

## 2019-04-03 MED ORDER — PAROXETINE HCL 20 MG PO TABS
20.0000 mg | ORAL_TABLET | Freq: Every day | ORAL | Status: DC
  Administered 2019-04-03: 20 mg via ORAL
  Filled 2019-04-03: qty 1

## 2019-04-03 MED ORDER — MAGNESIUM OXIDE 400 (241.3 MG) MG PO TABS
400.0000 mg | ORAL_TABLET | Freq: Every day | ORAL | Status: DC
  Administered 2019-04-03: 400 mg via ORAL
  Filled 2019-04-03 (×2): qty 1

## 2019-04-03 MED ORDER — LISINOPRIL 20 MG PO TABS
20.0000 mg | ORAL_TABLET | Freq: Every day | ORAL | Status: DC
  Administered 2019-04-03: 20 mg via ORAL
  Filled 2019-04-03: qty 1

## 2019-04-03 MED ORDER — ASPIRIN EC 81 MG PO TBEC: 81.0000 mg | DELAYED_RELEASE_TABLET | Freq: Every day | ORAL | Status: DC

## 2019-04-03 MED ORDER — ATORVASTATIN CALCIUM 80 MG PO TABS: 80.0000 mg | ORAL_TABLET | Freq: Every day | ORAL | 11 refills | Status: AC

## 2019-04-03 NOTE — Progress Notes (Signed)
[facility] Cardiology  SUBJECTIVE: Patient sitting in bed, denies chest pain   Vitals:   04/02/19 1835 04/02/19 1922 04/03/19 0404 04/03/19 0733  BP: 136/86 128/82 (!) 126/99 (!) 137/95  Pulse: 83 86 79 72  Resp: 18 18 18 16   Temp: 98.5 F (36.9 C) 98 F (36.7 C) (!) 97.5 F (36.4 C) 97.7 F (36.5 C)  TempSrc: Oral Oral Oral Oral  SpO2: 99% 98% 97% 95%  Weight:   105.2 kg   Height:         Intake/Output Summary (Last 24 hours) at 04/03/2019 0915 Last data filed at 04/03/2019 0408 Gross per 24 hour  Intake 123.62 ml  Output 3200 ml  Net -3076.38 ml      PHYSICAL EXAM  General: Well developed, well nourished, in no acute distress HEENT:  Normocephalic and atramatic Neck:  No JVD.  Lungs: Clear bilaterally to auscultation and percussion. Heart: HRRR . Normal S1 and S2 without gallops or murmurs.  Abdomen: Bowel sounds are positive, abdomen soft and non-tender  Msk:  Back normal, normal gait. Normal strength and tone for age. Extremities: No clubbing, cyanosis or edema.   Neuro: Alert and oriented X 3. Psych:  Good affect, responds appropriately   LABS: Basic Metabolic Panel: Recent Labs    04/02/19 0244 04/03/19 0347  NA 134* 139  K 3.8 3.5  CL 101 103  CO2 23 26  GLUCOSE 125* 96  BUN 13 10  CREATININE 0.63 0.55*  CALCIUM 8.7* 8.8*   Liver Function Tests: No results for input(s): AST, ALT, ALKPHOS, BILITOT, PROT, ALBUMIN in the last 72 hours. No results for input(s): LIPASE, AMYLASE in the last 72 hours. CBC: Recent Labs    04/02/19 0244 04/03/19 0347  WBC 8.9 9.5  HGB 12.5* 13.0  HCT 35.8* 38.3*  MCV 88.0 90.8  PLT 173 164   Cardiac Enzymes: Recent Labs    04/01/19 2011 04/02/19 0244 04/02/19 0939  TROPONINI <0.03 1.00* 2.55*   BNP: Invalid input(s): POCBNP D-Dimer: No results for input(s): DDIMER in the last 72 hours. Hemoglobin A1C: No results for input(s): HGBA1C in the last 72 hours. Fasting Lipid Panel: No results for input(s): CHOL,  HDL, LDLCALC, TRIG, CHOLHDL, LDLDIRECT in the last 72 hours. Thyroid Function Tests: No results for input(s): TSH, T4TOTAL, T3FREE, THYROIDAB in the last 72 hours.  Invalid input(s): FREET3 Anemia Panel: No results for input(s): VITAMINB12, FOLATE, FERRITIN, TIBC, IRON, RETICCTPCT in the last 72 hours.  Dg Chest Portable 1 View  Result Date: 04/01/2019 CLINICAL DATA:  Chest pain with shortness of breath for 2 weeks, worse in the past day. EXAM: PORTABLE CHEST 1 VIEW COMPARISON:  09/29/2018 FINDINGS: Pacing/defibrillator pads partially obscure the left lung. 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 The cardiomediastinal silhouette is unchanged with normal heart size. No airspace consolidation, edema, pleural effusion, pneumothorax is identified. No acute osseous abnormality is seen. IMPRESSION: No active disease. Electronically Signed   By: Logan Bores M.D.   On: 04/01/2019 20:37     Echo   TELEMETRY: Sinus:  ASSESSMENT AND PLAN:  Principal Problem:   Chest pain Active Problems:   CAD (coronary artery disease)   Diabetes (HCC)   HTN (hypertension)    1. NSTEMI 2.  Status post DES mid OM1, no recurrent chest pain  Recommendations  1.  Agree with current therapy 2.  DC home 3.  Continue dual antiplatelet therapy uninterrupted for 1 year 4.  Follow-up in 1 week  Sign off for now,  please call if any questions   Isaias Cowman, MD, PhD, [facility] 04/03/2019 9:15 AM

## 2019-04-03 NOTE — Plan of Care (Signed)
  Problem: Education: Goal: Knowledge of General Education information will improve Description Including pain rating scale, medication(s)/side effects and non-pharmacologic comfort measures Outcome: Adequate for Discharge   Problem: Health Behavior/Discharge Planning: Goal: Ability to manage health-related needs will improve Outcome: Adequate for Discharge   Problem: Clinical Measurements: Goal: Ability to maintain clinical measurements within normal limits will improve Outcome: Adequate for Discharge   Problem: Coping: Goal: Level of anxiety will decrease Outcome: Adequate for Discharge   Problem: Activity: Goal: Risk for activity intolerance will decrease Outcome: Adequate for Discharge   Problem: Clinical Measurements: Goal: Cardiovascular complication will be avoided Outcome: Adequate for Discharge

## 2019-04-03 NOTE — Discharge Summary (Signed)
[facility] at [facility], 63 y.o., DOB 06/12/1956, MRN [id number]. Admission date: 04/01/2019 Discharge Date 04/03/2019 Primary MD Rusty Aus, MD Admitting Physician Lance Coon, MD  Admission Diagnosis  NSTEMI (non-ST elevated myocardial infarction) [facility]) [I21.4]  Discharge Diagnosis   Principal Problem: Non-ST MI Coronary artery disease Diabetes type 2 Hypertension Hyperlipidemia   Hospital Course  Kent [name]  is a 63 y.o. male who presents with chief complaint as above.  Patient presents the ED with a complaint of chest discomfort.    Patient has known history of coronary artery disease.  He was noted to have elevated troponin.  Subsequent Kent [name] seen by cardiology and underwent cardiac catheterization.  Had a stent placed.  He is not having any further chest pains.   Due to acute non-ST MI patient was scheduled to have an echo however patient was very anxious to go home therefore echo was not done this will need to be done as outpatient.        Consults  cardiology  Significant Tests:  See full reports for all details    Dg Chest Portable 1 View  Result Date: 04/01/2019 CLINICAL DATA:  Chest pain with shortness of breath for 2 weeks, worse in the past day. EXAM: PORTABLE CHEST 1 VIEW COMPARISON:  09/29/2018 FINDINGS: Pacing/defibrillator pads partially obscure the left lung. 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 The cardiomediastinal silhouette is unchanged with normal heart size. No airspace consolidation, edema, pleural effusion, pneumothorax is identified. No acute osseous abnormality is seen. IMPRESSION: No active disease. Electronically Signed   By: Logan Bores M.D.   On: 04/01/2019 20:37       Today   Subjective:   Kent [name] patient doing much better no chest pain  Objective:   Blood pressure (!) 137/95, pulse 72, temperature 97.7 F (36.5 C), temperature source Oral, resp. rate 16, height 6' (1.829 m), weight  105.2 kg, SpO2 95 %.  .  Intake/Output Summary (Last 24 hours) at 04/03/2019 1559 Last data filed at 04/03/2019 1034 Gross per 24 hour  Intake 363.62 ml  Output 3200 ml  Net -2836.38 ml    Exam VITAL SIGNS: Blood pressure (!) 137/95, pulse 72, temperature 97.7 F (36.5 C), temperature source Oral, resp. rate 16, height 6' (1.829 m), weight 105.2 kg, SpO2 95 %.  GENERAL:  63 y.o.-year-old patient lying in the bed with no acute distress.  EYES: Pupils equal, round, reactive to light and accommodation. No scleral icterus. Extraocular muscles intact.  HEENT: Head atraumatic, normocephalic. Oropharynx and nasopharynx clear.  NECK:  Supple, no jugular venous distention. No thyroid enlargement, no tenderness.  LUNGS: Normal breath sounds bilaterally, no wheezing, rales,rhonchi or crepitation. No use of accessory muscles of respiration.  CARDIOVASCULAR: S1, S2 normal. No murmurs, rubs, or gallops.  ABDOMEN: Soft, nontender, nondistended. Bowel sounds present. No organomegaly or mass.  EXTREMITIES: No pedal edema, cyanosis, or clubbing.  NEUROLOGIC: Cranial nerves II through XII are intact. Muscle strength 5/5 in all extremities. Sensation intact. Gait not checked.  PSYCHIATRIC: The patient is alert and oriented x 3.  SKIN: No obvious rash, lesion, or ulcer.   Data Review     CBC w Diff:  Lab Results  Component Value Date   WBC 9.5 04/03/2019   HGB 13.0 04/03/2019   HGB 15.2 04/01/2013   HCT 38.3 (L) 04/03/2019   HCT 40.6 03/29/2013   PLT 164 04/03/2019   PLT 107 (L) 04/01/2013   CMP:  Lab Results  Component Value Date   NA 139 04/03/2019   NA 138 04/02/2013   K 3.5 04/03/2019   K 3.6 04/02/2013   CL 103 04/03/2019   CL 104 04/02/2013   CO2 26 04/03/2019   CO2 27 04/02/2013   BUN 10 04/03/2019   BUN 7 04/02/2013   CREATININE 0.55 (L) 04/03/2019   CREATININE 0.74 04/02/2013  .  Micro Results Recent Results (from the past 240 hour(s))  SARS Coronavirus 2 (CEPHEID -  Performed in [place] hospital lab), Hosp Order     Status: None   Collection Time: 04/01/19  8:25 PM  Result Value Ref Range Status   SARS Coronavirus 2 NEGATIVE NEGATIVE Final    Comment: (NOTE) If result is NEGATIVE SARS-CoV-2 target nucleic acids are NOT DETECTED. The SARS-CoV-2 RNA is generally detectable in upper and lower  respiratory specimens during the acute phase of infection. The lowest  concentration of SARS-CoV-2 viral copies this assay can detect is 250  copies / mL. A negative result does not preclude SARS-CoV-2 infection  and should not be used as the sole basis for treatment or other  patient management decisions.  A negative result may occur with  improper specimen collection / handling, submission of specimen other  than nasopharyngeal swab, presence of viral mutation(s) within the  areas targeted by this assay, and inadequate number of viral copies  (<250 copies / mL). A negative result must be combined with clinical  observations, patient history, and epidemiological information. If result is POSITIVE SARS-CoV-2 target nucleic acids are DETECTED. The SARS-CoV-2 RNA is generally detectable in upper and lower  respiratory specimens dur ing the acute phase of infection.  Positive  results are indicative of active infection with SARS-CoV-2.  Clinical  correlation with patient history and other diagnostic information is  necessary to determine patient infection status.  Positive results do  not rule out bacterial infection or co-infection with other viruses. If result is PRESUMPTIVE POSTIVE SARS-CoV-2 nucleic acids MAY BE PRESENT.   A presumptive positive result was obtained on the submitted specimen  and confirmed on repeat testing.  While 2019 novel coronavirus  (SARS-CoV-2) nucleic acids may be present in the submitted sample  additional confirmatory testing may be necessary for epidemiological  and / or clinical management purposes  to differentiate between   SARS-CoV-2 and other Sarbecovirus currently known to infect humans.  If clinically indicated additional testing with an alternate test  methodology [phone]) is advised. The SARS-CoV-2 RNA is generally  detectable in upper and lower respiratory sp ecimens during the acute  phase of infection. The expected result is Negative. Fact Sheet for Patients:  StrictlyIdeas.no Fact Sheet for Healthcare Providers: BankingDealers.co.za This test is not yet approved or cleared by the [place] FDA and has been authorized for detection and/or diagnosis of SARS-CoV-2 by FDA under an Emergency Use Authorization (EUA).  This EUA will remain in effect (meaning this test can be used) for the duration of the COVID-19 declaration under Section 564(b)(1) of the Act, 21 U.S.C. section 360bbb-3(b)(1), unless the authorization is terminated or revoked sooner. Performed at [facility], [address] [place], [place] [address]      Code Status History    Date Active Date Inactive Code Status Order ID Comments User Context   04/02/2019 1517 04/03/2019 1457 Full Code [id number]  Isaias Cowman, MD Inpatient   04/02/2019 0236 04/02/2019 1517 Full Code [id number]  Lance Coon, MD ED   12/05/2017 [phone] 12/06/2017  1422 Full Code [id number]  Isaias Cowman, MD Inpatient          Follow-up Information    Rusty Aus, MD On 04/09/2019.   Specialty:  Internal Medicine Why:  appointment at [place] information: [place] [address] [place], [place], PA-C On 04/09/2019.   Why:  hosp f/u............ appointment at [facility] information: [place] [address] [phone]        [facility] Cardiac and Pulmonary Rehab Follow up.   Specialty:  Cardiac Rehabilitation Why:  Your Cardiologist has referred you to Cardiac Rehab. The  Dept. is currently closed due to COVID-19. Virtual Rehab is available. The Dept. will contact you soon. Contact information: [place] [id number] [address] [place] [phone]          Discharge Medications   Allergies as of 04/03/2019      Reactions   Celebrex [celecoxib] Other (See Comments)   GI bleed   Nsaids    GI Bleed   Oxycodone Other (See Comments)   Hallucinations and agitations   Prednisone Other (See Comments)   Can't take high doses   Zoloft [sertraline Hcl] Other (See Comments)   Crazy      Medication List    STOP taking these medications   simvastatin 40 MG tablet Commonly known as:  ZOCOR     TAKE these medications   ALPRAZolam 0.25 MG tablet Commonly known as:  XANAX Take 0.125 mg by mouth 3 (three) times daily as needed for anxiety. Notes to patient:  Given this morning 0830   aspirin EC 81 MG tablet Take 81 mg by mouth at bedtime. Notes to patient:  Given this morning @ 0830   atorvastatin 80 MG tablet Commonly known as:  Lipitor Take 1 tablet (80 mg total) by mouth daily.   carvedilol 25 MG tablet Commonly known as:  COREG Take 25 mg by mouth 4 (four) times daily. Notes to patient:  Given this morning at 0830   CENTRUM ADULTS PO Take 1 tablet by mouth daily. Notes to patient:  tomorrow   clopidogrel 75 MG tablet Commonly known as:  PLAVIX Take 75 mg by mouth daily. Notes to patient:  tomorrow   ferrous sulfate 325 (65 FE) MG tablet Take 325 mg by mouth 2 (two) times daily with a meal. Notes to patient:  Given this morning at 0830   gabapentin 300 MG capsule Commonly known as:  NEURONTIN Take 300 mg by mouth See admin instructions. Take 300 mg by mouth at supper and take 300 mg by mouth at bedtime Notes to patient:  Take this evening    HYDROcodone-acetaminophen 7.5-325 MG tablet Commonly known as:  NORCO Take 0.5 tablets by mouth 3 (three) times daily as needed for moderate pain. Notes to  patient:  Given this morning at 0830   ibuprofen 200 MG tablet Commonly known as:  ADVIL Take 200-400 mg by mouth See admin instructions. Take 400 mg by mouth in the morning, take 200 mg by mouth at lunch and take 200 mg by mouth at bedtime   isosorbide mononitrate 30 MG 24 hr tablet Commonly known as:  IMDUR Take 30 mg by mouth daily. Notes to patient:  Take tomorrow, given this morning at 0830   lisinopril 20 MG tablet Commonly known as:  ZESTRIL Take 20 mg by mouth daily. Notes to patient:  Given  this morning at 0830 take tomrorow    Magnesium 500 MG Tabs Take 500 mg by mouth daily. Notes to patient:  Take tomorrow, given this morning at 0830   Melatonin 10 MG Caps Take 10 mg by mouth at bedtime. Notes to patient:  Take tonight    metFORMIN 500 MG tablet Commonly known as:  GLUCOPHAGE Take 500 mg by mouth 3 (three) times daily. Notes to patient:  DO NOT TAKE UNTIL TOMORROW   methocarbamol 500 MG tablet Commonly known as:  ROBAXIN Take 500 mg by mouth at bedtime. Notes to patient:  Take tonight    nitroGLYCERIN 0.4 MG SL tablet Commonly known as:  NITROSTAT Place 0.4 mg under the tongue every 5 (five) minutes as needed for chest pain.   nitroGLYCERIN 0.4 MG SL tablet Commonly known as:  Nitrostat Place 1 tablet (0.4 mg total) under the tongue every 5 (five) minutes as needed for chest pain.   omeprazole 20 MG capsule Commonly known as:  PRILOSEC Take 20 mg by mouth daily. Notes to patient:  Take tomorrow, given this morning at 0830   PARoxetine 20 MG tablet Commonly known as:  PAXIL Take 20 mg by mouth daily. Notes to patient:  Take tomorrow, given this morning at 1040          Total Time in preparing paper work, data evaluation and todays exam - 2 minutes  Dustin Flock M.D on 04/03/2019 at [place]  [phone]

## 2019-04-03 NOTE — Progress Notes (Signed)
Patient discharged home with wife, echo to be done outpatient, change to statin medication written and noted on AVS patient aware, and will pick up at [facility]. Follow up appointment information privided as well as midcation regimen information and femoral site acre instructions. Patient agreeable with no complaints or concerns.

## 2019-04-03 NOTE — Plan of Care (Signed)
  Problem: Clinical Measurements: Goal: Respiratory complications will improve Outcome: Progressing Goal: Cardiovascular complication will be avoided Outcome: Progressing   Problem: Activity: Goal: Risk for activity intolerance will decrease Outcome: Progressing   Problem: Elimination: Goal: Will not experience complications related to urinary retention Outcome: Progressing   Problem: Pain Managment: Goal: General experience of comfort will improve Outcome: Progressing   Problem: Skin Integrity: Goal: Risk for impaired skin integrity will decrease Outcome: Progressing   Problem: Cardiovascular: Goal: Vascular access site(s) Level 0-1 will be maintained Outcome: Progressing

## 2019-04-03 NOTE — Progress Notes (Signed)
Cardiac Rehab Navigator/ Exercise Physiologist Note  "Heart Attack Bouncing Back" booklet given and reviewed with patient. This EP discussed the definition of CAD. This EP reviewed the location of CAD and where his stent was placed. This EP informed patient he will be given a stent card. This EP explained the purpose of the stent card. This EP instructed patient to keep stent card in his wallet.  ? This EP discussed modifiable risk factors including controlling blood pressure, cholesterol, and blood sugar; following carb modified diet; maintaining healthy weight; exercise; and smoking cessation, not applicable. Patient checks blood pressure at home in the morning when he checks his blood sugar. Patient reports he also check his blood sugar when he feels symptomatic.  ? Discussed cardiac medications including rationale for taking, mechanisms of action, and side effects. Stressed the importance of taking medications as prescribed.  ? Discussed emergency plan for heart attack symptoms. Patient verbalized understanding of need to call 911 and not to drive himself to ER if having cardiac symptoms / chest pain.Patient has been prescribed Nitroglycerin before and has been advised by his Cardiologist how to use it and even has it set up by his bed side table at night. Patient understands that he should sit down before he takes his Nitro.   ? Diet of low sodium, low fat, low cholesterol carb modified diet discussed. Information on diet provided. Patient loves to cook. Patient likes to use real butter and no substitutes but otherwise reports making healthy choices such as olive oil, splenda instead of sugar, and spices and herbs instead of salt.  Stress- Patient has high amount of stress from work. 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  Smoking Cessation - Patient is a FORMER smoker.  Goal- Live 20 more years.  Exercise - Benefits of exercised discussed.  Patient walks 2 miles per day with his dog around a track behind his house. Patient reports losing 30 lbs in the past 2 months while not working due to COVID-19. Patient has been active during this time. Informed patient his cardiologist has referred him to outpatient Cardiac Rehab. An overview of the program was provided. Brochure, informational letter, class and orientation times, and CPT billing codes given to patient. Patient is interested in participating. Patient plans to check with his insurance company to see what his out-of-pocket expenses will be.   Patient informed the Cardiac Rehab department is currently closed due to the COVID-19 pandemic.  The Cardiac Rehab dept will contact patient as soon as the department reopens. Patient has been through the Cardiac Rehab program before at [facility]. Patient reports having CABG and 8 stents previously. Patient reports only being able to use the Nu-Step machine in Cardiac Rehab class due to herniated discs. Patient feels that he knows what to do for Rehab but is interested in [employer] and thinking about coming to Outpatient.  ?  Patient appreciative of the information.   Kent [name], [place] Cardiac & Pulmonary Rehab  Exercise Physiologist Department Phone #: [phone] Fax: [phone]  Direct Line [phone] Email Address: [email or web address]  ?

## 2019-04-03 NOTE — Progress Notes (Signed)
Pt will have echo when he sees dr. Vertis Kelch , he was ready to be discharged and wife is waiting downstairs and he doesn't now want to wait

## 2019-04-29 ENCOUNTER — Other Ambulatory Visit: Payer: Self-pay

## 2019-04-29 ENCOUNTER — Encounter: Payer: Medicare Other | Attending: Cardiology | Admitting: *Deleted

## 2019-04-29 DIAGNOSIS — Z955 Presence of coronary angioplasty implant and graft: Secondary | ICD-10-CM

## 2019-04-29 DIAGNOSIS — I214 Non-ST elevation (NSTEMI) myocardial infarction: Secondary | ICD-10-CM

## 2019-04-29 NOTE — Progress Notes (Signed)
Confirm Consent - "In the setting of the current Covid19 crisis, you are scheduled to join our "At Home" [facility]  Cardiac or Pulmonary  Rehab program . Just as we do with many in-gym visits, in order for you to participate in this program, we must obtain consent.  If you'd like, I can send this to your mychart (if signed up) or email for you to review.  Otherwise, I can obtain your verbal consent now.  By agreeing to a Cardiac or Pulmonary Rehab Telehealth visit, we'd like you to understand that the technology does not allow for your Cardiac or Pulmonary Rehab team member to perform a physical assessment, and thus may limit their ability to fully assess your ability to perform exercise programs. If your provider identifies any concerns that need to be evaluated in person, we will make arrangements to do so.  Finally, though the technology is pretty good, we cannot assure that it will always work on either your or our end and we cannot ensure that we have a secure connection.  Cardiac and Pulmonary Rehab Telehealth visits and "At Home" cardiac and pulmonary rehab are provided at no cost to you. Are you willing to proceed?" STAFF: Did the patient verbally acknowledge consent to telehealth visit? Document YES/NO here:YES    Date and Time 04/29/2019 1405                                               Staff completing consent process: Heath Lark RN BSN CCRP Email: [email or web address] Phone: [phone] Diagnosis: NSTEMI STents CONSENT COMPLETED: Yes  Risk Stratification:moderate Risk Factors:  prior MI, Diabetes Mellitus, hypertension, hypercholesterolemia/hyperlipidemia Current Exercise: walking   Every day with dog  1/2 mile twice a day Patient Exercise Barriers :legs reconstructed from knee to ankle  NO TM Mobility Assistive Device at Home:  straight cane at times Vital Sign Devices at [facility] pressure Exercise Equipment at Home: resistive bands   Has stairs in the house 18 steps Followup appointment made:  Yes To use [employer]:Yes  Entered on Dashboard Yes  SMS sent with invite Yes   appt made July 6 230 and 330 pm with RD and EP    Can only do Monday appts. Will need Hybrid Virtual program one day a week onsite 2 days Virtual

## 2019-05-07 ENCOUNTER — Other Ambulatory Visit: Payer: Self-pay

## 2019-05-07 ENCOUNTER — Encounter: Payer: Medicare Other | Attending: Cardiology | Admitting: *Deleted

## 2019-05-07 VITALS — Ht 71.25 in | Wt 230.0 lb

## 2019-05-07 DIAGNOSIS — Z7982 Long term (current) use of aspirin: Secondary | ICD-10-CM | POA: Diagnosis not present

## 2019-05-07 DIAGNOSIS — I214 Non-ST elevation (NSTEMI) myocardial infarction: Secondary | ICD-10-CM | POA: Insufficient documentation

## 2019-05-07 DIAGNOSIS — E1140 Type 2 diabetes mellitus with diabetic neuropathy, unspecified: Secondary | ICD-10-CM | POA: Insufficient documentation

## 2019-05-07 DIAGNOSIS — Z7984 Long term (current) use of oral hypoglycemic drugs: Secondary | ICD-10-CM | POA: Insufficient documentation

## 2019-05-07 DIAGNOSIS — Z79899 Other long term (current) drug therapy: Secondary | ICD-10-CM | POA: Diagnosis not present

## 2019-05-07 DIAGNOSIS — Z955 Presence of coronary angioplasty implant and graft: Secondary | ICD-10-CM | POA: Diagnosis not present

## 2019-05-07 DIAGNOSIS — M1990 Unspecified osteoarthritis, unspecified site: Secondary | ICD-10-CM | POA: Insufficient documentation

## 2019-05-07 DIAGNOSIS — I10 Essential (primary) hypertension: Secondary | ICD-10-CM | POA: Diagnosis not present

## 2019-05-07 DIAGNOSIS — Z7902 Long term (current) use of antithrombotics/antiplatelets: Secondary | ICD-10-CM | POA: Insufficient documentation

## 2019-05-07 DIAGNOSIS — Z87891 Personal history of nicotine dependence: Secondary | ICD-10-CM | POA: Insufficient documentation

## 2019-05-07 NOTE — Progress Notes (Signed)
Cardiac Individual Treatment Plan  Patient Details  Name: Kent [name] MRN: [id number] Date of Birth: 01/20/1956 Referring Provider:     Cardiac Rehab from 05/07/2019 in [facility] Cardiac and Pulmonary Rehab  Referring Provider  Isaias Cowman MD      Initial Encounter Date:    Cardiac Rehab from 05/07/2019 in [facility] Cardiac and Pulmonary Rehab  Date  05/07/19      Visit Diagnosis: NSTEMI (non-ST elevation myocardial infarction) [facility])  Status post coronary artery stent placement  Patient's Home Medications on Admission:  Current Outpatient Medications:  .  ALPRAZolam (XANAX) 0.25 MG tablet, Take 0.125 mg by mouth 3 (three) times daily as needed for anxiety., Disp: , Rfl:  .  aspirin EC 81 MG tablet, Take 81 mg by mouth at bedtime., Disp: , Rfl:  .  atorvastatin (LIPITOR) 80 MG tablet, Take 1 tablet (80 mg total) by mouth daily., Disp: 30 tablet, Rfl: 11 .  carvedilol (COREG) 25 MG tablet, Take 25 mg by mouth 4 (four) times daily., Disp: , Rfl:  .  clopidogrel (PLAVIX) 75 MG tablet, Take 75 mg by mouth daily., Disp: , Rfl:  .  ferrous sulfate 325 (65 FE) MG tablet, Take 325 mg by mouth 2 (two) times daily with a meal., Disp: , Rfl:  .  gabapentin (NEURONTIN) 300 MG capsule, Take 300 mg by mouth See admin instructions. Take 300 mg by mouth at supper and take 300 mg by mouth at bedtime, Disp: , Rfl:  .  HYDROcodone-acetaminophen (NORCO) 7.5-325 MG tablet, Take 0.5 tablets by mouth 3 (three) times daily as needed for moderate pain., Disp: , Rfl:  .  ibuprofen (ADVIL,MOTRIN) 200 MG tablet, Take 200-400 mg by mouth See admin instructions. Take 400 mg by mouth in the morning, take 200 mg by mouth at lunch and take 200 mg by mouth at bedtime, Disp: , Rfl:  .  isosorbide mononitrate (IMDUR) 30 MG 24 hr tablet, Take 30 mg by mouth daily., Disp: , Rfl:  .  lisinopril (PRINIVIL,ZESTRIL) 20 MG tablet, Take 20 mg by mouth daily., Disp: , Rfl:  .  Magnesium 500 MG TABS, Take 500 mg by mouth  daily., Disp: , Rfl:  .  Melatonin 10 MG CAPS, Take 10 mg by mouth at bedtime., Disp: , Rfl:  .  metFORMIN (GLUCOPHAGE) 500 MG tablet, Take 500 mg by mouth 3 (three) times daily., Disp: , Rfl:  .  methocarbamol (ROBAXIN) 500 MG tablet, Take 500 mg by mouth at bedtime., Disp: , Rfl:  .  Multiple Vitamins-Minerals (CENTRUM ADULTS PO), Take 1 tablet by mouth daily., Disp: , Rfl:  .  nitroGLYCERIN (NITROSTAT) 0.4 MG SL tablet, Place 0.4 mg under the tongue every 5 (five) minutes as needed for chest pain., Disp: , Rfl:  .  nitroGLYCERIN (NITROSTAT) 0.4 MG SL tablet, Place 1 tablet (0.4 mg total) under the tongue every 5 (five) minutes as needed for chest pain., Disp: 100 tablet, Rfl: 3 .  omeprazole (PRILOSEC) 20 MG capsule, Take 20 mg by mouth daily., Disp: , Rfl:  .  PARoxetine (PAXIL) 20 MG tablet, Take 20 mg by mouth daily., Disp: , Rfl:  .  vitamin C (ASCORBIC ACID) 250 MG tablet, Take 250 mg by mouth daily. Not sure of dose   2 gummies a day, Disp: , Rfl:   Past Medical History: Past Medical History:  Diagnosis Date  . Anemia   . Cardiovascular disease   . Diabetes mellitus ([place])    Type II  .  Hypertension   . Neuropathy due to secondary diabetes mellitus ([place])   . Osteoarthritis     Tobacco Use: Social History   Tobacco Use  Smoking Status Former Smoker  Smokeless Tobacco Never Used    Labs: Recent Review [facility] manager for ITP Cardiac and Pulmonary Rehab Latest Ref Rng & Units 03/30/2013   Cholestrol 0 - 200 mg/dL 124   LDLCALC 0 - 100 mg/dL 60   HDL 40 - 60 mg/dL 41   Trlycerides 0 - 200 mg/dL 115       Exercise Target Goals: Exercise Program Goal: Individual exercise prescription set using results from initial 6 min walk test and THRR while considering  patient's activity barriers and safety.   Exercise Prescription Goal: Initial exercise prescription builds to 30-45 minutes a day of aerobic activity, 2-3 days per week.  Home exercise guidelines will be  given to patient during program as part of exercise prescription that the participant will acknowledge.  Activity Barriers & Risk Stratification: Activity Barriers & Cardiac Risk Stratification - 05/07/19 1650      Activity Barriers & Cardiac Risk Stratification   Activity Barriers  Deconditioning;Muscular Weakness;Balance Concerns;History of Falls;Shortness of Breath;Assistive Device    Comments  Both legs rebuilt from knees down  cannot use the treadmill    Cardiac Risk Stratification  High       6 Minute Walk: 6 Minute Walk    Row Name 05/07/19 1645         6 Minute Walk   Phase  Initial     Distance  1320 feet     Walk Time  6 minutes     # of Rest Breaks  0     MPH  2.5     METS  3.24     RPE  12     Perceived Dyspnea   1     VO2 Peak  11.34     Symptoms  Yes (comment)     Comments  fatigue and foot drag     Resting HR  61 bpm     Resting BP  146/74     Resting Oxygen Saturation   98 %     Exercise Oxygen Saturation  during 6 min walk  96 %     Max Ex. HR  107 bpm     Max Ex. BP  154/76     2 Minute Post BP  134/62        Oxygen Initial Assessment:   Oxygen Re-Evaluation:   Oxygen Discharge (Final Oxygen Re-Evaluation):   Initial Exercise Prescription: Initial Exercise Prescription - 05/07/19 1600      Date of Initial Exercise RX and Referring Provider   Date  05/07/19    Referring Provider  Isaias Cowman MD      Recumbant Bike   Level  3    RPM  50    Watts  41    Minutes  15    METs  3      NuStep   Level  2    SPM  80    Minutes  15    METs  2.5      Arm Ergometer   Level  3    Watts  58    RPM  30    Minutes  15    METs  2.5      Recumbant Elliptical   Level  1    RPM  50    Minutes  15    METs  2.5      T5 Nustep   Level  2    SPM  80    Minutes  15    METs  2.5      Prescription Details   Frequency (times per week)  2    Duration  Progress to 30 minutes of continuous aerobic without signs/symptoms of physical  distress      Intensity   THRR 40-80% of Max Heartrate  99-136    Ratings of Perceived Exertion  11-13    Perceived Dyspnea  0-4      Progression   Progression  Continue to progress workloads to maintain intensity without signs/symptoms of physical distress.      Resistance Training   Training Prescription  Yes    Weight  3 lbs    Reps  10-15       Perform Capillary Blood Glucose checks as needed.  Exercise Prescription Changes:   Exercise Comments:   Exercise Goals and Review: Exercise Goals    Row Name 05/07/19 1655             Exercise Goals   Increase Physical Activity  Yes       Intervention  Provide advice, education, support and counseling about physical activity/exercise needs.;Develop an individualized exercise prescription for aerobic and resistive training based on initial evaluation findings, risk stratification, comorbidities and participant's personal goals.       Expected Outcomes  Short Term: Attend rehab on a regular basis to increase amount of physical activity.;Long Term: Add in home exercise to make exercise part of routine and to increase amount of physical activity.;Long Term: Exercising regularly at least 3-5 days a week.       Increase Strength and Stamina  Yes       Intervention  Provide advice, education, support and counseling about physical activity/exercise needs.;Develop an individualized exercise prescription for aerobic and resistive training based on initial evaluation findings, risk stratification, comorbidities and participant's personal goals.       Expected Outcomes  Short Term: Increase workloads from initial exercise prescription for resistance, speed, and METs.;Short Term: Perform resistance training exercises routinely during rehab and add in resistance training at home;Long Term: Improve cardiorespiratory fitness, muscular endurance and strength as measured by increased METs and functional capacity (6MWT)       Able to understand and  use rate of perceived exertion (RPE) scale  Yes       Intervention  Provide education and explanation on how to use RPE scale       Expected Outcomes  Short Term: Able to use RPE daily in rehab to express subjective intensity level;Long Term:  Able to use RPE to guide intensity level when exercising independently       Able to understand and use Dyspnea scale  Yes       Intervention  Provide education and explanation on how to use Dyspnea scale       Expected Outcomes  Short Term: Able to use Dyspnea scale daily in rehab to express subjective sense of shortness of breath during exertion;Long Term: Able to use Dyspnea scale to guide intensity level when exercising independently       Knowledge and understanding of Target Heart Rate Range (THRR)  Yes       Intervention  Provide education and explanation of THRR including how the numbers were predicted and where they are located for reference  Expected Outcomes  Short Term: Able to use daily as guideline for intensity in rehab;Short Term: Able to state/look up THRR;Long Term: Able to use THRR to govern intensity when exercising independently       Able to check pulse independently  Yes       Intervention  Review the importance of being able to check your own pulse for safety during independent exercise;Provide education and demonstration on how to check pulse in carotid and radial arteries.       Expected Outcomes  Short Term: Able to explain why pulse checking is important during independent exercise;Long Term: Able to check pulse independently and accurately       Understanding of Exercise Prescription  Yes       Intervention  Provide education, explanation, and written materials on patient's individual exercise prescription       Expected Outcomes  Short Term: Able to explain program exercise prescription;Long Term: Able to explain home exercise prescription to exercise independently          Exercise Goals Re-Evaluation :   Discharge  Exercise Prescription (Final Exercise Prescription Changes):   Nutrition:  Target Goals: Understanding of nutrition guidelines, daily intake of sodium '1500mg'$ , cholesterol '200mg'$ , calories 30% from fat and 7% or less from saturated fats, daily to have 5 or more servings of fruits and vegetables.  Biometrics: Pre Biometrics - 05/07/19 1656      Pre Biometrics   Height  5' 11.25" (1.81 m)    Weight  230 lb (104.3 kg)    BMI (Calculated)  31.84        Nutrition Therapy Plan and Nutrition Goals: Nutrition Therapy & Goals - 05/07/19 1639      Nutrition Therapy   Diet  Low Na, HH, diabetic diet    Protein (specify units)  85g    Fiber  25 grams    Whole Grain Foods  3 servings    Saturated Fats  12 max. grams    Fruits and Vegetables  5 servings/day    Sodium  1.5 grams      Personal Nutrition Goals   Nutrition Goal  ST: stop eating take out burgers (talked about switching to chicken at [employer]) LT: increase stamina    Comments  Pt reports eating fast food for almost every meal due to wife not wanting healthy meals. Pt reports loving green leafy vegetables and fruit. Pt will make homemade sorbet at night. Discussed HH eating, MyPlate, low Na, healthy snacking, BG control, and suggested meal prepping or cooking foods and eating something different from his wife. Pt reports eating fruit at work (catelope and watermelon). Pt does not have teeth but can eat somethings well. possible goal to eat 2 snacks during the day to help maintain BG. Pt reports that his doctor said he isn't needed enough- BG may be low. On metformin. pt does his best to choose healthy options when eating out such as vegetables, taking skin of chicken, etc.      Intervention Plan   Intervention  Prescribe, educate and counsel regarding individualized specific dietary modifications aiming towards targeted core components such as weight, hypertension, lipid management, diabetes, heart failure and other  comorbidities.;Nutrition handout(s) given to patient.    Expected Outcomes  Short Term Goal: Understand basic principles of dietary content, such as calories, fat, sodium, cholesterol and nutrients.;Short Term Goal: A plan has been developed with personal nutrition goals set during dietitian appointment.;Long Term Goal: Adherence to prescribed nutrition plan.  Nutrition Assessments:   Nutrition Goals Re-Evaluation:   Nutrition Goals Discharge (Final Nutrition Goals Re-Evaluation):   Psychosocial: Target Goals: Acknowledge presence or absence of significant depression and/or stress, maximize coping skills, provide positive support system. Participant is able to verbalize types and ability to use techniques and skills needed for reducing stress and depression.   Initial Review & Psychosocial Screening:   Quality of Life Scores:   Scores of 19 and below usually indicate a poorer quality of life in these areas.  A difference of  2-3 points is a clinically meaningful difference.  A difference of 2-3 points in the total score of the Quality of Life Index has been associated with significant improvement in overall quality of life, self-image, physical symptoms, and general health in studies assessing change in quality of life.  PHQ-9: Recent Review Flowsheet Data    There is no flowsheet data to display.     Interpretation of Total Score  Total Score Depression Severity:  1-4 = Minimal depression, 5-9 = Mild depression, 10-14 = Moderate depression, 15-19 = Moderately severe depression, 20-27 = Severe depression   Psychosocial Evaluation and Intervention:   Psychosocial Re-Evaluation:   Psychosocial Discharge (Final Psychosocial Re-Evaluation):   Vocational Rehabilitation: Provide vocational rehab assistance to qualifying candidates.   Vocational Rehab Evaluation & Intervention: Vocational Rehab - 04/29/19 1434      Initial Vocational Rehab Evaluation & Intervention    Assessment shows need for Vocational Rehabilitation  No       Education: Education Goals: Education classes will be provided on a variety of topics geared toward better understanding of heart health and risk factor modification. Participant will state understanding/return demonstration of topics presented as noted by education test scores.  Learning Barriers/Preferences:   Education Topics:  AED/CPR: - Group verbal and written instruction with the use of models to demonstrate the basic use of the AED with the basic ABC's of resuscitation.   General Nutrition Guidelines/Fats and Fiber: -Group instruction provided by verbal, written material, models and posters to present the general guidelines for heart healthy nutrition. Gives an explanation and review of dietary fats and fiber.   Controlling Sodium/Reading Food Labels: -Group verbal and written material supporting the discussion of sodium use in heart healthy nutrition. Review and explanation with models, verbal and written materials for utilization of the food label.   Exercise Physiology & General Exercise Guidelines: - Group verbal and written instruction with models to review the exercise physiology of the cardiovascular system and associated critical values. Provides general exercise guidelines with specific guidelines to those with heart or lung disease.    Aerobic Exercise & Resistance Training: - Gives group verbal and written instruction on the various components of exercise. Focuses on aerobic and resistive training programs and the benefits of this training and how to safely progress through these programs..   Flexibility, Balance, Mind/Body Relaxation: Provides group verbal/written instruction on the benefits of flexibility and balance training, including mind/body exercise modes such as yoga, pilates and tai chi.  Demonstration and skill practice provided.   Stress and Anxiety: - Provides group verbal and written  instruction about the health risks of elevated stress and causes of high stress.  Discuss the correlation between heart/lung disease and anxiety and treatment options. Review healthy ways to manage with stress and anxiety.   Depression: - Provides group verbal and written instruction on the correlation between heart/lung disease and depressed mood, treatment options, and the stigmas associated with seeking treatment.   Anatomy &  Physiology of the Heart: - Group verbal and written instruction and models provide basic cardiac anatomy and physiology, with the coronary electrical and arterial systems. Review of Valvular disease and Heart Failure   Cardiac Procedures: - Group verbal and written instruction to review commonly prescribed medications for heart disease. Reviews the medication, class of the drug, and side effects. Includes the steps to properly store meds and maintain the prescription regimen. (beta blockers and nitrates)   Cardiac Medications I: - Group verbal and written instruction to review commonly prescribed medications for heart disease. Reviews the medication, class of the drug, and side effects. Includes the steps to properly store meds and maintain the prescription regimen.   Cardiac Medications II: -Group verbal and written instruction to review commonly prescribed medications for heart disease. Reviews the medication, class of the drug, and side effects. (all other drug classes)    Go Sex-Intimacy & Heart Disease, Get SMART - Goal Setting: - Group verbal and written instruction through game format to discuss heart disease and the return to sexual intimacy. Provides group verbal and written material to discuss and apply goal setting through the application of the S.M.A.R.T. Method.   Other Matters of the Heart: - Provides group verbal, written materials and models to describe Stable Angina and Peripheral Artery. Includes description of the disease process and treatment  options available to the cardiac patient.   Exercise & Equipment Safety: - Individual verbal instruction and demonstration of equipment use and safety with use of the equipment.   Cardiac Rehab from 05/07/2019 in [facility] Cardiac and Pulmonary Rehab  Date  05/07/19  Educator  [facility]  Instruction Review Code  1- Verbalizes Understanding      Infection Prevention: - Provides verbal and written material to individual with discussion of infection control including proper hand washing and proper equipment cleaning during exercise session.   Cardiac Rehab from 05/07/2019 in [facility] Cardiac and Pulmonary Rehab  Date  05/07/19  Educator  [facility]  Instruction Review Code  1- Verbalizes Understanding      Falls Prevention: - Provides verbal and written material to individual with discussion of falls prevention and safety.   Cardiac Rehab from 05/07/2019 in [facility] Cardiac and Pulmonary Rehab  Date  05/07/19  Educator  [facility]  Instruction Review Code  1- Verbalizes Understanding      Diabetes: - Individual verbal and written instruction to review signs/symptoms of diabetes, desired ranges of glucose level fasting, after meals and with exercise. Acknowledge that pre and post exercise glucose checks will be done for 3 sessions at entry of program.   Cardiac Rehab from 05/07/2019 in [name] Cardiac and Pulmonary Rehab  Date  05/07/19  Educator  [facility]  Instruction Review Code  1- Verbalizes Understanding      Know Your Numbers and Risk Factors: -Group verbal and written instruction about important numbers in your health.  Discussion of what are risk factors and how they play a role in the disease process.  Review of Cholesterol, Blood Pressure, Diabetes, and BMI and the role they play in your overall health.   Sleep Hygiene: -Provides group verbal and written instruction about how sleep can affect your health.  Define sleep hygiene, discuss sleep cycles and impact of sleep habits. Review good sleep hygiene tips.     Other: -Provides group and verbal instruction on various topics (see comments)   Knowledge Questionnaire Score:   Core Components/Risk Factors/Patient Goals at Admission: Personal Goals and Risk Factors at Admission - 05/07/19 1656  Core Components/Risk Factors/Patient Goals on Admission    Weight Management  Yes;Obesity;Weight Loss    Intervention  Weight Management: Develop a combined nutrition and exercise program designed to reach desired caloric intake, while maintaining appropriate intake of nutrient and fiber, sodium and fats, and appropriate energy expenditure required for the weight goal.;Weight Management: Provide education and appropriate resources to help participant work on and attain dietary goals.;Obesity: Provide education and appropriate resources to help participant work on and attain dietary goals.;Weight Management/Obesity: Establish reasonable short term and long term weight goals.    Admit Weight  230 lb (104.3 kg)    Goal Weight: Short Term  225 lb (102.1 kg)    Goal Weight: Long Term  220 lb (99.8 kg)    Expected Outcomes  Short Term: Continue to assess and modify interventions until short term weight is achieved;Long Term: Adherence to nutrition and physical activity/exercise program aimed toward attainment of established weight goal;Weight Loss: Understanding of general recommendations for a balanced deficit meal plan, which promotes 1-2 lb weight loss per week and includes a negative energy balance of [phone] kcal/d;Understanding recommendations for meals to include 15-35% energy as protein, 25-35% energy from fat, 35-60% energy from carbohydrates, less than '200mg'$  of dietary cholesterol, 20-35 gm of total fiber daily;Understanding of distribution of calorie intake throughout the day with the consumption of 4-5 meals/snacks    Diabetes  Yes    Intervention  Provide education about signs/symptoms and action to take for hypo/hyperglycemia.;Provide education about  proper nutrition, including hydration, and aerobic/resistive exercise prescription along with prescribed medications to achieve blood glucose in normal ranges: Fasting glucose 65-99 mg/dL    Expected Outcomes  Short Term: Participant verbalizes understanding of the signs/symptoms and immediate care of hyper/hypoglycemia, proper foot care and importance of medication, aerobic/resistive exercise and nutrition plan for blood glucose control.;Long Term: Attainment of HbA1C < 7%.    Hypertension  Yes    Intervention  Provide education on lifestyle modifcations including regular physical activity/exercise, weight management, moderate sodium restriction and increased consumption of fresh fruit, vegetables, and low fat dairy, alcohol moderation, and smoking cessation.;Monitor prescription use compliance.    Expected Outcomes  Short Term: Continued assessment and intervention until BP is < 140/49m HG in hypertensive participants. < 130/826mHG in hypertensive participants with diabetes, heart failure or chronic kidney disease.;Long Term: Maintenance of blood pressure at goal levels.    Lipids  Yes    Intervention  Provide education and support for participant on nutrition & aerobic/resistive exercise along with prescribed medications to achieve LDL '70mg'$ , HDL >'40mg'$ .    Expected Outcomes  Short Term: Participant states understanding of desired cholesterol values and is compliant with medications prescribed. Participant is following exercise prescription and nutrition guidelines.;Long Term: Cholesterol controlled with medications as prescribed, with individualized exercise RX and with personalized nutrition plan. Value goals: LDL < '70mg'$ , HDL > 40 mg.       Core Components/Risk Factors/Patient Goals Review:    Core Components/Risk Factors/Patient Goals at Discharge (Final Review):    ITP Comments: ITP Comments    Row Name 04/29/19 1432 05/07/19 1634         ITP Comments  Initial Virtual Vist completed  today   Obtained consent reviewd Risk factors and set appt for EP and RD Followup.  Completed 6MWT and nutrition evaluations today.  [facility]  Initial ITP created and sent for review to Dr. MaEmily FilbertMedical Director.  Comments: Initial ITP

## 2019-05-07 NOTE — Patient Instructions (Signed)
Patient Instructions  Patient Details  Name: Kent [name] MRN: [id number] Date of Birth: 1956/06/23 Referring Provider:  Isaias Cowman, MD  Below are your personal goals for exercise, nutrition, and risk factors. Our goal is to help you stay on track towards obtaining and maintaining these goals. We will be discussing your progress on these goals with you throughout the program.  Initial Exercise Prescription: Initial Exercise Prescription - 05/07/19 1600      Date of Initial Exercise RX and Referring Provider   Date  05/07/19    Referring Provider  Paraschos, Alexander MD      Recumbant Bike   Level  3    RPM  50    Watts  41    Minutes  15    METs  3      NuStep   Level  2    SPM  80    Minutes  15    METs  2.5      Arm Ergometer   Level  3    Watts  58    RPM  30    Minutes  15    METs  2.5      Recumbant Elliptical   Level  1    RPM  50    Minutes  15    METs  2.5      T5 Nustep   Level  2    SPM  80    Minutes  15    METs  2.5      Prescription Details   Frequency (times per week)  2    Duration  Progress to 30 minutes of continuous aerobic without signs/symptoms of physical distress      Intensity   THRR 40-80% of Max Heartrate  99-136    Ratings of Perceived Exertion  11-13    Perceived Dyspnea  0-4      Progression   Progression  Continue to progress workloads to maintain intensity without signs/symptoms of physical distress.      Resistance Training   Training Prescription  Yes    Weight  3 lbs    Reps  10-15       Exercise Goals: Frequency: Be able to perform aerobic exercise two to three times per week in program working toward 2-5 days per week of home exercise.  Intensity: Work with a perceived exertion of 11 (fairly light) - 15 (hard) while following your exercise prescription.  We will make changes to your prescription with you as you progress through the program.   Duration: Be able to do 30 to 45 minutes of  continuous aerobic exercise in addition to a 5 minute warm-up and a 5 minute cool-down routine.   Nutrition Goals: Your personal nutrition goals will be established when you do your nutrition analysis with the dietician.  The following are general nutrition guidelines to follow: Cholesterol < 200mg /day Sodium < 1500mg /day Fiber: Men over 50 yrs - 30 grams per day  Personal Goals: Personal Goals and Risk Factors at Admission - 05/07/19 1656      Core Components/Risk Factors/Patient Goals on Admission    Weight Management  Yes;Obesity;Weight Loss    Intervention  Weight Management: Develop a combined nutrition and exercise program designed to reach desired caloric intake, while maintaining appropriate intake of nutrient and fiber, sodium and fats, and appropriate energy expenditure required for the weight goal.;Weight Management: Provide education and appropriate resources to help participant work on and attain dietary goals.;Obesity:  Provide education and appropriate resources to help participant work on and attain dietary goals.;Weight Management/Obesity: Establish reasonable short term and long term weight goals.    Admit Weight  230 lb (104.3 kg)    Goal Weight: Short Term  225 lb (102.1 kg)    Goal Weight: Long Term  220 lb (99.8 kg)    Expected Outcomes  Short Term: Continue to assess and modify interventions until short term weight is achieved;Long Term: Adherence to nutrition and physical activity/exercise program aimed toward attainment of established weight goal;Weight Loss: Understanding of general recommendations for a balanced deficit meal plan, which promotes 1-2 lb weight loss per week and includes a negative energy balance of [phone] kcal/d;Understanding recommendations for meals to include 15-35% energy as protein, 25-35% energy from fat, 35-60% energy from carbohydrates, less than 200mg  of dietary cholesterol, 20-35 gm of total fiber daily;Understanding of distribution of calorie  intake throughout the day with the consumption of 4-5 meals/snacks    Diabetes  Yes    Intervention  Provide education about signs/symptoms and action to take for hypo/hyperglycemia.;Provide education about proper nutrition, including hydration, and aerobic/resistive exercise prescription along with prescribed medications to achieve blood glucose in normal ranges: Fasting glucose 65-99 mg/dL    Expected Outcomes  Short Term: Participant verbalizes understanding of the signs/symptoms and immediate care of hyper/hypoglycemia, proper foot care and importance of medication, aerobic/resistive exercise and nutrition plan for blood glucose control.;Long Term: Attainment of HbA1C < 7%.    Hypertension  Yes    Intervention  Provide education on lifestyle modifcations including regular physical activity/exercise, weight management, moderate sodium restriction and increased consumption of fresh fruit, vegetables, and low fat dairy, alcohol moderation, and smoking cessation.;Monitor prescription use compliance.    Expected Outcomes  Short Term: Continued assessment and intervention until BP is < 140/77mm HG in hypertensive participants. < 130/24mm HG in hypertensive participants with diabetes, heart failure or chronic kidney disease.;Long Term: Maintenance of blood pressure at goal levels.    Lipids  Yes    Intervention  Provide education and support for participant on nutrition & aerobic/resistive exercise along with prescribed medications to achieve LDL 70mg , HDL >40mg .    Expected Outcomes  Short Term: Participant states understanding of desired cholesterol values and is compliant with medications prescribed. Participant is following exercise prescription and nutrition guidelines.;Long Term: Cholesterol controlled with medications as prescribed, with individualized exercise RX and with personalized nutrition plan. Value goals: LDL < 70mg , HDL > 40 mg.       Tobacco Use Initial Evaluation: Social History    Tobacco Use  Smoking Status Former Smoker  Smokeless Tobacco Never Used    Exercise Goals and Review: Exercise Goals    Row Name 05/07/19 1655             Exercise Goals   Increase Physical Activity  Yes       Intervention  Provide advice, education, support and counseling about physical activity/exercise needs.;Develop an individualized exercise prescription for aerobic and resistive training based on initial evaluation findings, risk stratification, comorbidities and participant's personal goals.       Expected Outcomes  Short Term: Attend rehab on a regular basis to increase amount of physical activity.;Long Term: Add in home exercise to make exercise part of routine and to increase amount of physical activity.;Long Term: Exercising regularly at least 3-5 days a week.       Increase Strength and Stamina  Yes       Intervention  Provide advice,  education, support and counseling about physical activity/exercise needs.;Develop an individualized exercise prescription for aerobic and resistive training based on initial evaluation findings, risk stratification, comorbidities and participant's personal goals.       Expected Outcomes  Short Term: Increase workloads from initial exercise prescription for resistance, speed, and METs.;Short Term: Perform resistance training exercises routinely during rehab and add in resistance training at home;Long Term: Improve cardiorespiratory fitness, muscular endurance and strength as measured by increased METs and functional capacity (6MWT)       Able to understand and use rate of perceived exertion (RPE) scale  Yes       Intervention  Provide education and explanation on how to use RPE scale       Expected Outcomes  Short Term: Able to use RPE daily in rehab to express subjective intensity level;Long Term:  Able to use RPE to guide intensity level when exercising independently       Able to understand and use Dyspnea scale  Yes       Intervention  Provide  education and explanation on how to use Dyspnea scale       Expected Outcomes  Short Term: Able to use Dyspnea scale daily in rehab to express subjective sense of shortness of breath during exertion;Long Term: Able to use Dyspnea scale to guide intensity level when exercising independently       Knowledge and understanding of Target Heart Rate Range (THRR)  Yes       Intervention  Provide education and explanation of THRR including how the numbers were predicted and where they are located for reference       Expected Outcomes  Short Term: Able to use daily as guideline for intensity in rehab;Short Term: Able to state/look up THRR;Long Term: Able to use THRR to govern intensity when exercising independently       Able to check pulse independently  Yes       Intervention  Review the importance of being able to check your own pulse for safety during independent exercise;Provide education and demonstration on how to check pulse in carotid and radial arteries.       Expected Outcomes  Short Term: Able to explain why pulse checking is important during independent exercise;Long Term: Able to check pulse independently and accurately       Understanding of Exercise Prescription  Yes       Intervention  Provide education, explanation, and written materials on patient's individual exercise prescription       Expected Outcomes  Short Term: Able to explain program exercise prescription;Long Term: Able to explain home exercise prescription to exercise independently          Copy of goals given to participant.

## 2019-05-13 ENCOUNTER — Other Ambulatory Visit: Payer: Self-pay

## 2019-05-13 DIAGNOSIS — I214 Non-ST elevation (NSTEMI) myocardial infarction: Secondary | ICD-10-CM

## 2019-05-13 LAB — GLUCOSE, CAPILLARY
Glucose-Capillary: 144 mg/dL — ABNORMAL HIGH (ref 70–99)
Glucose-Capillary: 85 mg/dL (ref 70–99)

## 2019-05-13 NOTE — Progress Notes (Signed)
Daily Session Note  Patient Details  Name: Kent [name] MRN: [id number] Date of Birth: 08-Jul-1956 Referring Provider:     Cardiac Rehab from 05/07/2019 in [facility] Cardiac and Pul[facility] Rehab  Referring Provider  Isaias Cowman MD      Encounter Date: 05/13/2019  Check In: Session Check In - 05/13/19 1411      Check-In   Supervising physician immediately available to respond to emergencies  See telemetry face sheet for immediately available ER MD    Location  [facility]-Cardiac & Pulmonary Rehab    Staff Present  Earlean Shawl, BS, ACSM CEP, Exercise Physiologist;Susanne Bice, RN, BSN, CCRP;Jaylyne Breese RCP,RRT,BSRT    Virtual Visit  No    Medication changes reported      No    Fall or balance concerns reported     No    Warm-up and Cool-down  Performed on first and last piece of equipment    Resistance Training Performed  Yes    VAD Patient?  No    PAD/SET Patient?  No      Pain Assessment   Currently in Pain?  No/denies    Multiple Pain Sites  No          Social History   Tobacco Use  Smoking Status Former Smoker  Smokeless Tobacco Never Used    Goals Met:  Exercise tolerated well Personal goals reviewed No report of cardiac concerns or symptoms Strength training completed today  Goals Unmet:  Not Applicable  Comments: First full day of exercise!  Patient was oriented to gym and equipment including functions, settings, policies, and procedures.  Patient's individual exercise prescription and treatment plan were reviewed.  All starting workloads were established based on the results of the 6 minute walk test done at initial orientation visit.  The plan for exercise progression was also introduced and progression will be customized based on patient's performance and goals.    Dr. Emily Filbert is Medical Director for [place] and [facility].

## 2019-05-15 ENCOUNTER — Other Ambulatory Visit: Payer: Self-pay

## 2019-05-15 ENCOUNTER — Encounter: Payer: Self-pay | Admitting: *Deleted

## 2019-05-15 ENCOUNTER — Encounter: Payer: Medicare Other | Admitting: *Deleted

## 2019-05-15 DIAGNOSIS — Z955 Presence of coronary angioplasty implant and graft: Secondary | ICD-10-CM

## 2019-05-15 DIAGNOSIS — I214 Non-ST elevation (NSTEMI) myocardial infarction: Secondary | ICD-10-CM | POA: Diagnosis not present

## 2019-05-15 LAB — GLUCOSE, CAPILLARY: Glucose-Capillary: 106 mg/dL — ABNORMAL HIGH (ref 70–99)

## 2019-05-15 NOTE — Progress Notes (Signed)
Daily Session Note  Patient Details  Name: Bonnie Roig MRN: [id number] Date of Birth: [birth year] Referring Provider:     Cardiac Rehab from 05/07/2019 in [facility] Cardiac and Pulmonary Rehab  Referring Provider  Isaias Cowman MD      Encounter Date: 05/15/2019  Check In: Session Check In - 05/15/19 1402      Check-In   Supervising physician immediately available to respond to emergencies  See telemetry face sheet for immediately available ER MD    Location  [facility]-Cardiac & Pulmonary Rehab    Staff Present  Justin Mend RCP,RRT,BSRT;Olivea Sonnen Sherryll Burger, RN BSN    Virtual Visit  No    Medication changes reported      No    Fall or balance concerns reported     No    Warm-up and Cool-down  Performed on first and last piece of equipment    Resistance Training Performed  Yes    VAD Patient?  No    PAD/SET Patient?  No      Pain Assessment   Currently in Pain?  No/denies          Social History   Tobacco Use  Smoking Status Former Smoker  Smokeless Tobacco Never Used    Goals Met:  Independence with exercise equipment Exercise tolerated well No report of cardiac concerns or symptoms Strength training completed today  Goals Unmet:  Not Applicable  Comments: Pt able to follow exercise prescription today without complaint.  Will continue to monitor for progression.    Dr. Emily Filbert is Medical Director for [place] and [facility].

## 2019-05-15 NOTE — Progress Notes (Signed)
Cardiac Individual Treatment Plan  Patient Details  Name: Fouad Taul MRN: [id number] Date of Birth: 01/20/1956 Referring Provider:     Cardiac Rehab from 05/07/2019 in [facility] Cardiac and Pulmonary Rehab  Referring Provider  Isaias Cowman MD      Initial Encounter Date:    Cardiac Rehab from 05/07/2019 in [facility] Cardiac and Pulmonary Rehab  Date  05/07/19      Visit Diagnosis: NSTEMI (non-ST elevation myocardial infarction) [facility])  Status post coronary artery stent placement  Patient's Home Medications on Admission:  Current Outpatient Medications:  .  ALPRAZolam (XANAX) 0.25 MG tablet, Take 0.125 mg by mouth 3 (three) times daily as needed for anxiety., Disp: , Rfl:  .  aspirin EC 81 MG tablet, Take 81 mg by mouth at bedtime., Disp: , Rfl:  .  atorvastatin (LIPITOR) 80 MG tablet, Take 1 tablet (80 mg total) by mouth daily., Disp: 30 tablet, Rfl: 11 .  carvedilol (COREG) 25 MG tablet, Take 25 mg by mouth 4 (four) times daily., Disp: , Rfl:  .  clopidogrel (PLAVIX) 75 MG tablet, Take 75 mg by mouth daily., Disp: , Rfl:  .  ferrous sulfate 325 (65 FE) MG tablet, Take 325 mg by mouth 2 (two) times daily with a meal., Disp: , Rfl:  .  gabapentin (NEURONTIN) 300 MG capsule, Take 300 mg by mouth See admin instructions. Take 300 mg by mouth at supper and take 300 mg by mouth at bedtime, Disp: , Rfl:  .  HYDROcodone-acetaminophen (NORCO) 7.5-325 MG tablet, Take 0.5 tablets by mouth 3 (three) times daily as needed for moderate pain., Disp: , Rfl:  .  ibuprofen (ADVIL,MOTRIN) 200 MG tablet, Take 200-400 mg by mouth See admin instructions. Take 400 mg by mouth in the morning, take 200 mg by mouth at lunch and take 200 mg by mouth at bedtime, Disp: , Rfl:  .  isosorbide mononitrate (IMDUR) 30 MG 24 hr tablet, Take 30 mg by mouth daily., Disp: , Rfl:  .  lisinopril (PRINIVIL,ZESTRIL) 20 MG tablet, Take 20 mg by mouth daily., Disp: , Rfl:  .  Magnesium 500 MG TABS, Take 500 mg by mouth  daily., Disp: , Rfl:  .  Melatonin 10 MG CAPS, Take 10 mg by mouth at bedtime., Disp: , Rfl:  .  metFORMIN (GLUCOPHAGE) 500 MG tablet, Take 500 mg by mouth 3 (three) times daily., Disp: , Rfl:  .  methocarbamol (ROBAXIN) 500 MG tablet, Take 500 mg by mouth at bedtime., Disp: , Rfl:  .  Multiple Vitamins-Minerals (CENTRUM ADULTS PO), Take 1 tablet by mouth daily., Disp: , Rfl:  .  nitroGLYCERIN (NITROSTAT) 0.4 MG SL tablet, Place 0.4 mg under the tongue every 5 (five) minutes as needed for chest pain., Disp: , Rfl:  .  nitroGLYCERIN (NITROSTAT) 0.4 MG SL tablet, Place 1 tablet (0.4 mg total) under the tongue every 5 (five) minutes as needed for chest pain., Disp: 100 tablet, Rfl: 3 .  omeprazole (PRILOSEC) 20 MG capsule, Take 20 mg by mouth daily., Disp: , Rfl:  .  PARoxetine (PAXIL) 20 MG tablet, Take 20 mg by mouth daily., Disp: , Rfl:  .  vitamin C (ASCORBIC ACID) 250 MG tablet, Take 250 mg by mouth daily. Not sure of dose   2 gummies a day, Disp: , Rfl:   Past Medical History: Past Medical History:  Diagnosis Date  . Anemia   . Cardiovascular disease   . Diabetes mellitus ([place])    Type II  .  Hypertension   . Neuropathy due to secondary diabetes mellitus ([place])   . Osteoarthritis     Tobacco Use: Social History   Tobacco Use  Smoking Status Former Smoker  Smokeless Tobacco Never Used    Labs: Recent Review [facility] Cardiac and Pulmonary Rehab Latest Ref Rng & Units 03/30/2013   Cholestrol 0 - 200 mg/dL 124   LDLCALC 0 - 100 mg/dL 60   HDL 40 - 60 mg/dL 41   Trlycerides 0 - 200 mg/dL 115       Exercise Target Goals: Exercise Program Goal: Individual exercise prescription set using results from initial 6 min walk test and THRR while considering  patient's activity barriers and safety.   Exercise Prescription Goal: Initial exercise prescription builds to 30-45 minutes a day of aerobic activity, 2-3 days per week.  Home exercise guidelines will be  given to patient during program as part of exercise prescription that the participant will acknowledge.  Activity Barriers & Risk Stratification: Activity Barriers & Cardiac Risk Stratification - 05/07/19 1650      Activity Barriers & Cardiac Risk Stratification   Activity Barriers  Deconditioning;Muscular Weakness;Balance Concerns;History of Falls;Shortness of Breath;Assistive Device    Comments  Both legs rebuilt from knees down  cannot use the treadmill    Cardiac Risk Stratification  High       6 Minute Walk: 6 Minute Walk    Row Name 05/07/19 1645         6 Minute Walk   Phase  Initial     Distance  1320 feet     Walk Time  6 minutes     # of Rest Breaks  0     MPH  2.5     METS  3.24     RPE  12     Perceived Dyspnea   1     VO2 Peak  11.34     Symptoms  Yes (comment)     Comments  fatigue and foot drag     Resting HR  61 bpm     Resting BP  146/74     Resting Oxygen Saturation   98 %     Exercise Oxygen Saturation  during 6 min walk  96 %     Max Ex. HR  107 bpm     Max Ex. BP  154/76     2 Minute Post BP  134/62        Oxygen Initial Assessment:   Oxygen Re-Evaluation:   Oxygen Discharge (Final Oxygen Re-Evaluation):   Initial Exercise Prescription: Initial Exercise Prescription - 05/07/19 1600      Date of Initial Exercise RX and Referring Provider   Date  05/07/19    Referring Provider  Isaias Cowman MD      Recumbant Bike   Level  3    RPM  50    Watts  41    Minutes  15    METs  3      NuStep   Level  2    SPM  80    Minutes  15    METs  2.5      Arm Ergometer   Level  3    Watts  58    RPM  30    Minutes  15    METs  2.5      Recumbant Elliptical   Level  1    RPM  50    Minutes  15    METs  2.5      T5 Nustep   Level  2    SPM  80    Minutes  15    METs  2.5      Prescription Details   Frequency (times per week)  2    Duration  Progress to 30 minutes of continuous aerobic without signs/symptoms of physical  distress      Intensity   THRR 40-80% of Max Heartrate  99-136    Ratings of Perceived Exertion  11-13    Perceived Dyspnea  0-4      Progression   Progression  Continue to progress workloads to maintain intensity without signs/symptoms of physical distress.      Resistance Training   Training Prescription  Yes    Weight  3 lbs    Reps  10-15       Perform Capillary Blood Glucose checks as needed.  Exercise Prescription Changes:   Exercise Comments: Exercise Comments    Row Name 05/13/19 1455           Exercise Comments  First full day of exercise!  Patient was oriented to gym and equipment including functions, settings, policies, and procedures.  Patient's individual exercise prescription and treatment plan were reviewed.  All starting workloads were established based on the results of the 6 minute walk test done at initial orientation visit.  The plan for exercise progression was also introduced and progression will be customized based on patient's performance and goals          Exercise Goals and Review: Exercise Goals    Row Name 05/07/19 1655             Exercise Goals   Increase Physical Activity  Yes       Intervention  Provide advice, education, support and counseling about physical activity/exercise needs.;Develop an individualized exercise prescription for aerobic and resistive training based on initial evaluation findings, risk stratification, comorbidities and participant's personal goals.       Expected Outcomes  Short Term: Attend rehab on a regular basis to increase amount of physical activity.;Long Term: Add in home exercise to make exercise part of routine and to increase amount of physical activity.;Long Term: Exercising regularly at least 3-5 days a week.       Increase Strength and Stamina  Yes       Intervention  Provide advice, education, support and counseling about physical activity/exercise needs.;Develop an individualized exercise prescription  for aerobic and resistive training based on initial evaluation findings, risk stratification, comorbidities and participant's personal goals.       Expected Outcomes  Short Term: Increase workloads from initial exercise prescription for resistance, speed, and METs.;Short Term: Perform resistance training exercises routinely during rehab and add in resistance training at home;Long Term: Improve cardiorespiratory fitness, muscular endurance and strength as measured by increased METs and functional capacity (6MWT)       Able to understand and use rate of perceived exertion (RPE) scale  Yes       Intervention  Provide education and explanation on how to use RPE scale       Expected Outcomes  Short Term: Able to use RPE daily in rehab to express subjective intensity level;Long Term:  Able to use RPE to guide intensity level when exercising independently       Able to understand and use Dyspnea scale  Yes  Intervention  Provide education and explanation on how to use Dyspnea scale       Expected Outcomes  Short Term: Able to use Dyspnea scale daily in rehab to express subjective sense of shortness of breath during exertion;Long Term: Able to use Dyspnea scale to guide intensity level when exercising independently       Knowledge and understanding of Target Heart Rate Range (THRR)  Yes       Intervention  Provide education and explanation of THRR including how the numbers were predicted and where they are located for reference       Expected Outcomes  Short Term: Able to use daily as guideline for intensity in rehab;Short Term: Able to state/look up THRR;Long Term: Able to use THRR to govern intensity when exercising independently       Able to check pulse independently  Yes       Intervention  Review the importance of being able to check your own pulse for safety during independent exercise;Provide education and demonstration on how to check pulse in carotid and radial arteries.       Expected Outcomes   Short Term: Able to explain why pulse checking is important during independent exercise;Long Term: Able to check pulse independently and accurately       Understanding of Exercise Prescription  Yes       Intervention  Provide education, explanation, and written materials on patient's individual exercise prescription       Expected Outcomes  Short Term: Able to explain program exercise prescription;Long Term: Able to explain home exercise prescription to exercise independently          Exercise Goals Re-Evaluation : Exercise Goals Re-Evaluation    Row Name 05/13/19 1456             Exercise Goal Re-Evaluation   Exercise Goals Review  Able to understand and use rate of perceived exertion (RPE) scale;Knowledge and understanding of Target Heart Rate Range (THRR);Understanding of Exercise Prescription       Comments  Reviewed RPE scale, THR and program prescription with pt today.  Pt voiced understanding and was given a copy of goals to take home.       Expected Outcomes  Short: Use RPE daily to regulate intensity. Long: Follow program prescription in THR.          Discharge Exercise Prescription (Final Exercise Prescription Changes):   Nutrition:  Target Goals: Understanding of nutrition guidelines, daily intake of sodium '1500mg'$ , cholesterol '200mg'$ , calories 30% from fat and 7% or less from saturated fats, daily to have 5 or more servings of fruits and vegetables.  Biometrics: Pre Biometrics - 05/07/19 1656      Pre Biometrics   Height  5' 11.25" (1.81 m)    Weight  230 lb (104.3 kg)    BMI (Calculated)  31.84        Nutrition Therapy Plan and Nutrition Goals: Nutrition Therapy & Goals - 05/07/19 1639      Nutrition Therapy   Diet  Low Na, HH, diabetic diet    Protein (specify units)  85g    Fiber  25 grams    Whole Grain Foods  3 servings    Saturated Fats  12 max. grams    Fruits and Vegetables  5 servings/day    Sodium  1.5 grams      Personal Nutrition Goals    Nutrition Goal  ST: stop eating take out burgers (talked about switching to chicken at [employer])  LT: increase stamina    Comments  Pt reports eating fast food for almost every meal due to wife not wanting healthy meals. Pt reports loving green leafy vegetables and fruit. Pt will make homemade sorbet at night. Discussed HH eating, MyPlate, low Na, healthy snacking, BG control, and suggested meal prepping or cooking foods and eating something different from his wife. Pt reports eating fruit at work (catelope and watermelon). Pt does not have teeth but can eat somethings well. possible goal to eat 2 snacks during the day to help maintain BG. Pt reports that his doctor said he isn't needed enough- BG may be low. On metformin. pt does his best to choose healthy options when eating out such as vegetables, taking skin of chicken, etc.      Intervention Plan   Intervention  Prescribe, educate and counsel regarding individualized specific dietary modifications aiming towards targeted core components such as weight, hypertension, lipid management, diabetes, heart failure and other comorbidities.;Nutrition handout(s) given to patient.    Expected Outcomes  Short Term Goal: Understand basic principles of dietary content, such as calories, fat, sodium, cholesterol and nutrients.;Short Term Goal: A plan has been developed with personal nutrition goals set during dietitian appointment.;Long Term Goal: Adherence to prescribed nutrition plan.       Nutrition Assessments:   Nutrition Goals Re-Evaluation:   Nutrition Goals Discharge (Final Nutrition Goals Re-Evaluation):   Psychosocial: Target Goals: Acknowledge presence or absence of significant depression and/or stress, maximize coping skills, provide positive support system. Participant is able to verbalize types and ability to use techniques and skills needed for reducing stress and depression.   Initial Review & Psychosocial Screening:   Quality of Life  Scores:   Scores of 19 and below usually indicate a poorer quality of life in these areas.  A difference of  2-3 points is a clinically meaningful difference.  A difference of 2-3 points in the total score of the Quality of Life Index has been associated with significant improvement in overall quality of life, self-image, physical symptoms, and general health in studies assessing change in quality of life.  PHQ-9: Recent Review Flowsheet Data    There is no flowsheet data to display.     Interpretation of Total Score  Total Score Depression Severity:  1-4 = Minimal depression, 5-9 = Mild depression, 10-14 = Moderate depression, 15-19 = Moderately severe depression, 20-27 = Severe depression   Psychosocial Evaluation and Intervention:   Psychosocial Re-Evaluation:   Psychosocial Discharge (Final Psychosocial Re-Evaluation):   Vocational Rehabilitation: Provide vocational rehab assistance to qualifying candidates.   Vocational Rehab Evaluation & Intervention: Vocational Rehab - 04/29/19 1434      Initial Vocational Rehab Evaluation & Intervention   Assessment shows need for Vocational Rehabilitation  No       Education: Education Goals: Education classes will be provided on a variety of topics geared toward better understanding of heart health and risk factor modification. Participant will state understanding/return demonstration of topics presented as noted by education test scores.  Learning Barriers/Preferences:   Education Topics:  AED/CPR: - Group verbal and written instruction with the use of models to demonstrate the basic use of the AED with the basic ABC's of resuscitation.   General Nutrition Guidelines/Fats and Fiber: -Group instruction provided by verbal, written material, models and posters to present the general guidelines for heart healthy nutrition. Gives an explanation and review of dietary fats and fiber.   Controlling Sodium/Reading Food  Labels: -Group verbal and written material  supporting the discussion of sodium use in heart healthy nutrition. Review and explanation with models, verbal and written materials for utilization of the food label.   Exercise Physiology & General Exercise Guidelines: - Group verbal and written instruction with models to review the exercise physiology of the cardiovascular system and associated critical values. Provides general exercise guidelines with specific guidelines to those with heart or lung disease.    Aerobic Exercise & Resistance Training: - Gives group verbal and written instruction on the various components of exercise. Focuses on aerobic and resistive training programs and the benefits of this training and how to safely progress through these programs..   Flexibility, Balance, Mind/Body Relaxation: Provides group verbal/written instruction on the benefits of flexibility and balance training, including mind/body exercise modes such as yoga, pilates and tai chi.  Demonstration and skill practice provided.   Stress and Anxiety: - Provides group verbal and written instruction about the health risks of elevated stress and causes of high stress.  Discuss the correlation between heart/lung disease and anxiety and treatment options. Review healthy ways to manage with stress and anxiety.   Depression: - Provides group verbal and written instruction on the correlation between heart/lung disease and depressed mood, treatment options, and the stigmas associated with seeking treatment.   Anatomy & Physiology of the Heart: - Group verbal and written instruction and models provide basic cardiac anatomy and physiology, with the coronary electrical and arterial systems. Review of Valvular disease and Heart Failure   Cardiac Procedures: - Group verbal and written instruction to review commonly prescribed medications for heart disease. Reviews the medication, class of the drug, and side effects.  Includes the steps to properly store meds and maintain the prescription regimen. (beta blockers and nitrates)   Cardiac Medications I: - Group verbal and written instruction to review commonly prescribed medications for heart disease. Reviews the medication, class of the drug, and side effects. Includes the steps to properly store meds and maintain the prescription regimen.   Cardiac Medications II: -Group verbal and written instruction to review commonly prescribed medications for heart disease. Reviews the medication, class of the drug, and side effects. (all other drug classes)    Go Sex-Intimacy & Heart Disease, Get SMART - Goal Setting: - Group verbal and written instruction through game format to discuss heart disease and the return to sexual intimacy. Provides group verbal and written material to discuss and apply goal setting through the application of the S.M.A.R.T. Method.   Other Matters of the Heart: - Provides group verbal, written materials and models to describe Stable Angina and Peripheral Artery. Includes description of the disease process and treatment options available to the cardiac patient.   Exercise & Equipment Safety: - Individual verbal instruction and demonstration of equipment use and safety with use of the equipment.   Cardiac Rehab from 05/07/2019 in [facility] Cardiac and Pulmonary Rehab  Date  05/07/19  Educator  [facility]  Instruction Review Code  1- Verbalizes Understanding      Infection Prevention: - Provides verbal and written material to individual with discussion of infection control including proper hand washing and proper equipment cleaning during exercise session.   Cardiac Rehab from 05/07/2019 in [facility] Cardiac and Pulmonary Rehab  Date  05/07/19  Educator  [facility]  Instruction Review Code  1- Verbalizes Understanding      Falls Prevention: - Provides verbal and written material to individual with discussion of falls prevention and safety.   Cardiac Rehab  from 05/07/2019 in [facility] Cardiac  and Pulmonary Rehab  Date  05/07/19  Educator  [facility]  Instruction Review Code  1- Verbalizes Understanding      Diabetes: - Individual verbal and written instruction to review signs/symptoms of diabetes, desired ranges of glucose level fasting, after meals and with exercise. Acknowledge that pre and post exercise glucose checks will be done for 3 sessions at entry of program.   Cardiac Rehab from 05/07/2019 in [facility] Cardiac and Pulmonary Rehab  Date  05/07/19  Educator  [facility]  Instruction Review Code  1- Verbalizes Understanding      Know Your Numbers and Risk Factors: -Group verbal and written instruction about important numbers in your health.  Discussion of what are risk factors and how they play a role in the disease process.  Review of Cholesterol, Blood Pressure, Diabetes, and BMI and the role they play in your overall health.   Sleep Hygiene: -Provides group verbal and written instruction about how sleep can affect your health.  Define sleep hygiene, discuss sleep cycles and impact of sleep habits. Review good sleep hygiene tips.    Other: -Provides group and verbal instruction on various topics (see comments)   Knowledge Questionnaire Score:   Core Components/Risk Factors/Patient Goals at Admission: Personal Goals and Risk Factors at Admission - 05/07/19 1656      Core Components/Risk Factors/Patient Goals on Admission    Weight Management  Yes;Obesity;Weight Loss    Intervention  Weight Management: Develop a combined nutrition and exercise program designed to reach desired caloric intake, while maintaining appropriate intake of nutrient and fiber, sodium and fats, and appropriate energy expenditure required for the weight goal.;Weight Management: Provide education and appropriate resources to help participant work on and attain dietary goals.;Obesity: Provide education and appropriate resources to help participant work on and attain dietary  goals.;Weight Management/Obesity: Establish reasonable short term and long term weight goals.    Admit Weight  230 lb (104.3 kg)    Goal Weight: Short Term  225 lb (102.1 kg)    Goal Weight: Long Term  220 lb (99.8 kg)    Expected Outcomes  Short Term: Continue to assess and modify interventions until short term weight is achieved;Long Term: Adherence to nutrition and physical activity/exercise program aimed toward attainment of established weight goal;Weight Loss: Understanding of general recommendations for a balanced deficit meal plan, which promotes 1-2 lb weight loss per week and includes a negative energy balance of [phone] kcal/d;Understanding recommendations for meals to include 15-35% energy as protein, 25-35% energy from fat, 35-60% energy from carbohydrates, less than '200mg'$  of dietary cholesterol, 20-35 gm of total fiber daily;Understanding of distribution of calorie intake throughout the day with the consumption of 4-5 meals/snacks    Diabetes  Yes    Intervention  Provide education about signs/symptoms and action to take for hypo/hyperglycemia.;Provide education about proper nutrition, including hydration, and aerobic/resistive exercise prescription along with prescribed medications to achieve blood glucose in normal ranges: Fasting glucose 65-99 mg/dL    Expected Outcomes  Short Term: Participant verbalizes understanding of the signs/symptoms and immediate care of hyper/hypoglycemia, proper foot care and importance of medication, aerobic/resistive exercise and nutrition plan for blood glucose control.;Long Term: Attainment of HbA1C < 7%.    Hypertension  Yes    Intervention  Provide education on lifestyle modifcations including regular physical activity/exercise, weight management, moderate sodium restriction and increased consumption of fresh fruit, vegetables, and low fat dairy, alcohol moderation, and smoking cessation.;Monitor prescription use compliance.    Expected Outcomes  Short  Term:  Continued assessment and intervention until BP is < 140/40m HG in hypertensive participants. < 130/891mHG in hypertensive participants with diabetes, heart failure or chronic kidney disease.;Long Term: Maintenance of blood pressure at goal levels.    Lipids  Yes    Intervention  Provide education and support for participant on nutrition & aerobic/resistive exercise along with prescribed medications to achieve LDL '70mg'$ , HDL >'40mg'$ .    Expected Outcomes  Short Term: Participant states understanding of desired cholesterol values and is compliant with medications prescribed. Participant is following exercise prescription and nutrition guidelines.;Long Term: Cholesterol controlled with medications as prescribed, with individualized exercise RX and with personalized nutrition plan. Value goals: LDL < '70mg'$ , HDL > 40 mg.       Core Components/Risk Factors/Patient Goals Review:    Core Components/Risk Factors/Patient Goals at Discharge (Final Review):    ITP Comments: ITP Comments    Row Name 04/29/19 1432 05/07/19 1634 05/15/19 1412       ITP Comments  Initial Virtual Vist completed today   Obtained consent reviewd Risk factors and set appt for EP and RD Followup.  Completed 6MWT and nutrition evaluations today.  [facility]  Initial ITP created and sent for review to Dr. MaEmily FilbertMedical Director.  30 day review cycle restarting  after being closed since March 16 because of  Covid 19 pandemic. Program opened to patients on July 6. Not all have returned. ITP updated and sent to Medical Director for review,changes as needed and signature        Comments:

## 2019-05-20 ENCOUNTER — Encounter: Payer: Medicare Other | Admitting: *Deleted

## 2019-05-20 ENCOUNTER — Other Ambulatory Visit: Payer: Self-pay

## 2019-05-20 DIAGNOSIS — I214 Non-ST elevation (NSTEMI) myocardial infarction: Secondary | ICD-10-CM | POA: Diagnosis not present

## 2019-05-20 DIAGNOSIS — Z955 Presence of coronary angioplasty implant and graft: Secondary | ICD-10-CM

## 2019-05-20 LAB — GLUCOSE, CAPILLARY
Glucose-Capillary: 112 mg/dL — ABNORMAL HIGH (ref 70–99)
Glucose-Capillary: 88 mg/dL (ref 70–99)

## 2019-05-20 NOTE — Progress Notes (Signed)
Daily Session Note  Patient Details  Name: Kent [name] MRN: [id number] Date of Birth: [birth year] Referring Provider:     Cardiac Rehab from 05/07/2019 in [facility] Cardiac and Pulmonary Rehab  Referring Provider  Isaias Cowman MD      Encounter Date: 05/20/2019  Check In: Session Check In - 05/20/19 1422      Check-In   Supervising physician immediately available to respond to emergencies  See telemetry face sheet for immediately available ER MD    Location  [facility]-Cardiac & Pulmonary Rehab    Staff Present  Heath Lark, RN, BSN, CCRP;Jessica Zihlman, MA, RCEP, CCRP, Tom Bean, BS, ACSM CEP, Exercise Physiologist    Virtual Visit  No    Medication changes reported      No    Fall or balance concerns reported     No    Warm-up and Cool-down  Performed on first and last piece of equipment    Resistance Training Performed  Yes    VAD Patient?  No    PAD/SET Patient?  No      Pain Assessment   Currently in Pain?  No/denies          Social History   Tobacco Use  Smoking Status Former Smoker  Smokeless Tobacco Never Used    Goals Met:  Independence with exercise equipment Exercise tolerated well No report of cardiac concerns or symptoms Strength training completed today  Goals Unmet:  Not Applicable  Comments: Pt able to follow exercise prescription today without complaint.  Will continue to monitor for progression.    Dr. Emily Filbert is Medical Director for [place] and [facility].

## 2019-05-22 ENCOUNTER — Encounter: Payer: Medicare Other | Admitting: *Deleted

## 2019-05-22 ENCOUNTER — Other Ambulatory Visit: Payer: Self-pay

## 2019-05-22 DIAGNOSIS — Z955 Presence of coronary angioplasty implant and graft: Secondary | ICD-10-CM

## 2019-05-22 DIAGNOSIS — I214 Non-ST elevation (NSTEMI) myocardial infarction: Secondary | ICD-10-CM | POA: Diagnosis not present

## 2019-05-22 LAB — GLUCOSE, CAPILLARY
Glucose-Capillary: 152 mg/dL — ABNORMAL HIGH (ref 70–99)
Glucose-Capillary: 134 mg/dL — ABNORMAL HIGH (ref 70–99)

## 2019-05-22 NOTE — Progress Notes (Signed)
Daily Session Note  Patient Details  Name: Kent [name] MRN: [id number] Date of Birth: Mar 10, 1956 Referring Provider:     Cardiac Rehab from 05/07/2019 in [facility] Cardiac and Pulmonary Rehab  Referring Provider  Isaias Cowman MD      Encounter Date: 05/22/2019  Check In: Session Check In - 05/22/19 1406      Check-In   Supervising physician immediately available to respond to emergencies  See telemetry face sheet for immediately available ER MD    Location  [facility]-Cardiac & Pulmonary Rehab    Staff Present  Renita Papa, RN BSN;Melissa Caiola RDN, LDN;Joseph Tessie Fass RCP,RRT,BSRT    Virtual Visit  No    Medication changes reported      No    Fall or balance concerns reported     No    Warm-up and Cool-down  Performed on first and last piece of equipment    Resistance Training Performed  Yes    VAD Patient?  No    PAD/SET Patient?  No      Pain Assessment   Currently in Pain?  No/denies          Social History   Tobacco Use  Smoking Status Former Smoker  Smokeless Tobacco Never Used    Goals Met:  Proper associated with RPD/PD & O2 Sat Exercise tolerated well No report of cardiac concerns or symptoms Strength training completed today  Goals Unmet:  Not Applicable  Comments: Pt able to follow exercise prescription today without complaint.  Will continue to monitor for progression.    Dr. Emily Filbert is Medical Director for [place] and [facility].

## 2019-05-27 ENCOUNTER — Encounter: Payer: Medicare Other | Admitting: *Deleted

## 2019-05-27 ENCOUNTER — Other Ambulatory Visit: Payer: Self-pay

## 2019-05-27 DIAGNOSIS — Z955 Presence of coronary angioplasty implant and graft: Secondary | ICD-10-CM

## 2019-05-27 DIAGNOSIS — I214 Non-ST elevation (NSTEMI) myocardial infarction: Secondary | ICD-10-CM

## 2019-05-27 NOTE — Progress Notes (Signed)
Daily Session Note  Patient Details  Name: Kent [name] MRN: [id number] Date of Birth: 1956-01-20 Referring Provider:     Cardiac Rehab from 05/07/2019 in [facility] Cardiac and Pulmonary Rehab  Referring Provider  Isaias Cowman MD      Encounter Date: 05/27/2019  Check In: Session Check In - 05/27/19 1401      Check-In   Supervising physician immediately available to respond to emergencies  See telemetry face sheet for immediately available ER MD    Location  [facility]-Cardiac & Pulmonary Rehab    Staff Present  Renita Papa, RN Moises Blood, BS, ACSM CEP, Exercise Physiologist;Joseph Tessie Fass RCP,RRT,BSRT    Virtual Visit  No    Medication changes reported      No    Fall or balance concerns reported     No    Warm-up and Cool-down  Performed on first and last piece of equipment    Resistance Training Performed  Yes    VAD Patient?  No    PAD/SET Patient?  No      Pain Assessment   Currently in Pain?  No/denies          Social History   Tobacco Use  Smoking Status Former Smoker  Smokeless Tobacco Never Used    Goals Met:  Independence with exercise equipment Exercise tolerated well Personal goals reviewed No report of cardiac concerns or symptoms Strength training completed today  Goals Unmet:  Not Applicable  Comments: Pt able to follow exercise prescription today without complaint.  Will continue to monitor for progression.  Reviewed home exercise with pt today.  Pt plans to walking at home for exercise.  Reviewed THR, pulse, RPE, sign and symptoms, NTG use, and when to call 911 or MD.  Also discussed weather considerations and indoor options.  Pt voiced understanding.   Dr. Emily Filbert is Medical Director for [place] and [facility].

## 2019-05-29 ENCOUNTER — Other Ambulatory Visit: Payer: Self-pay

## 2019-05-29 ENCOUNTER — Encounter: Payer: Medicare Other | Admitting: *Deleted

## 2019-05-29 DIAGNOSIS — Z955 Presence of coronary angioplasty implant and graft: Secondary | ICD-10-CM

## 2019-05-29 DIAGNOSIS — I214 Non-ST elevation (NSTEMI) myocardial infarction: Secondary | ICD-10-CM | POA: Diagnosis not present

## 2019-05-29 NOTE — Progress Notes (Signed)
Daily Session Note  Patient Details  Name: Kent [name] MRN: [id number] Date of Birth: 12-14-1955 Referring Provider:     Cardiac Rehab from 05/07/2019 in [facility] Cardiac and Pulmonary Rehab  Referring Provider  Isaias Cowman MD      Encounter Date: 05/29/2019  Check In: Session Check In - 05/29/19 1403      Check-In   Supervising physician immediately available to respond to emergencies  See telemetry face sheet for immediately available ER MD    Location  [facility]-Cardiac & Pulmonary Rehab    Staff Present  Renita Papa, RN BSN;Joseph Tessie Fass RCP,RRT,BSRT    Virtual Visit  No    Medication changes reported      No    Fall or balance concerns reported     No    Warm-up and Cool-down  Performed on first and last piece of equipment    Resistance Training Performed  Yes    VAD Patient?  No    PAD/SET Patient?  No      Pain Assessment   Currently in Pain?  No/denies          Social History   Tobacco Use  Smoking Status Former Smoker  Smokeless Tobacco Never Used    Goals Met:  Independence with exercise equipment Exercise tolerated well No report of cardiac concerns or symptoms Strength training completed today  Goals Unmet:  Not Applicable  Comments: Pt able to follow exercise prescription today without complaint.  Will continue to monitor for progression.    Dr. Emily Filbert is Medical Director for [place] and [facility].

## 2019-06-05 ENCOUNTER — Encounter: Payer: Medicare Other | Attending: Cardiology

## 2019-06-05 DIAGNOSIS — Z7982 Long term (current) use of aspirin: Secondary | ICD-10-CM | POA: Insufficient documentation

## 2019-06-05 DIAGNOSIS — Z7984 Long term (current) use of oral hypoglycemic drugs: Secondary | ICD-10-CM | POA: Insufficient documentation

## 2019-06-05 DIAGNOSIS — Z87891 Personal history of nicotine dependence: Secondary | ICD-10-CM | POA: Insufficient documentation

## 2019-06-05 DIAGNOSIS — I10 Essential (primary) hypertension: Secondary | ICD-10-CM | POA: Insufficient documentation

## 2019-06-05 DIAGNOSIS — E1140 Type 2 diabetes mellitus with diabetic neuropathy, unspecified: Secondary | ICD-10-CM | POA: Insufficient documentation

## 2019-06-05 DIAGNOSIS — Z955 Presence of coronary angioplasty implant and graft: Secondary | ICD-10-CM | POA: Insufficient documentation

## 2019-06-05 DIAGNOSIS — I214 Non-ST elevation (NSTEMI) myocardial infarction: Secondary | ICD-10-CM | POA: Insufficient documentation

## 2019-06-05 DIAGNOSIS — Z7902 Long term (current) use of antithrombotics/antiplatelets: Secondary | ICD-10-CM | POA: Insufficient documentation

## 2019-06-05 DIAGNOSIS — Z79899 Other long term (current) drug therapy: Secondary | ICD-10-CM | POA: Insufficient documentation

## 2019-06-05 DIAGNOSIS — M1990 Unspecified osteoarthritis, unspecified site: Secondary | ICD-10-CM | POA: Insufficient documentation

## 2019-06-10 ENCOUNTER — Other Ambulatory Visit: Payer: Self-pay

## 2019-06-10 ENCOUNTER — Encounter: Payer: Medicare Other | Admitting: *Deleted

## 2019-06-10 DIAGNOSIS — E1140 Type 2 diabetes mellitus with diabetic neuropathy, unspecified: Secondary | ICD-10-CM | POA: Diagnosis not present

## 2019-06-10 DIAGNOSIS — Z955 Presence of coronary angioplasty implant and graft: Secondary | ICD-10-CM | POA: Diagnosis not present

## 2019-06-10 DIAGNOSIS — Z7982 Long term (current) use of aspirin: Secondary | ICD-10-CM | POA: Diagnosis not present

## 2019-06-10 DIAGNOSIS — M1990 Unspecified osteoarthritis, unspecified site: Secondary | ICD-10-CM | POA: Diagnosis not present

## 2019-06-10 DIAGNOSIS — Z79899 Other long term (current) drug therapy: Secondary | ICD-10-CM | POA: Diagnosis not present

## 2019-06-10 DIAGNOSIS — Z7984 Long term (current) use of oral hypoglycemic drugs: Secondary | ICD-10-CM | POA: Diagnosis not present

## 2019-06-10 DIAGNOSIS — I214 Non-ST elevation (NSTEMI) myocardial infarction: Secondary | ICD-10-CM

## 2019-06-10 DIAGNOSIS — Z7902 Long term (current) use of antithrombotics/antiplatelets: Secondary | ICD-10-CM | POA: Diagnosis not present

## 2019-06-10 DIAGNOSIS — Z87891 Personal history of nicotine dependence: Secondary | ICD-10-CM | POA: Diagnosis not present

## 2019-06-10 DIAGNOSIS — I10 Essential (primary) hypertension: Secondary | ICD-10-CM | POA: Diagnosis not present

## 2019-06-10 NOTE — Progress Notes (Signed)
Daily Session Note  Patient Details  Name: Kent [name] MRN: [id number] Date of Birth: 03/22/1956 Referring Provider:     Cardiac Rehab from 05/07/2019 in [facility] Cardiac and Pulmonary Rehab  Referring Provider  Paraschos, Alexander MD      Encounter Date: 06/10/2019  Check In: Session Check In - 06/10/19 1359      Check-In   Supervising physician immediately available to respond to emergencies  See telemetry face sheet for immediately available ER MD    Location  [facility]-Cardiac & Pulmonary Rehab    Staff Present  Meredith Craven, RN BSN;Amanda Sommer, BA, ACSM CEP, Exercise Physiologist;Joseph Hood RCP,RRT,BSRT    Virtual Visit  No    Medication changes reported      No    Fall or balance concerns reported     No    Warm-up and Cool-down  Performed on first and last piece of equipment    Resistance Training Performed  Yes    VAD Patient?  No    PAD/SET Patient?  No      Pain Assessment   Currently in Pain?  No/denies          Social History   Tobacco Use  Smoking Status Former Smoker  Smokeless Tobacco Never Used    Goals Met:  Independence with exercise equipment Exercise tolerated well No report of cardiac concerns or symptoms Strength training completed today  Goals Unmet:  Not Applicable  Comments: Pt able to follow exercise prescription today without complaint.  Will continue to monitor for progression.    Dr. Mark Miller is Medical Director for [facility] and [facility]. 

## 2019-06-12 ENCOUNTER — Encounter: Payer: Self-pay | Admitting: *Deleted

## 2019-06-12 ENCOUNTER — Other Ambulatory Visit: Payer: Self-pay

## 2019-06-12 DIAGNOSIS — Z955 Presence of coronary angioplasty implant and graft: Secondary | ICD-10-CM

## 2019-06-12 DIAGNOSIS — I214 Non-ST elevation (NSTEMI) myocardial infarction: Secondary | ICD-10-CM

## 2019-06-12 NOTE — Progress Notes (Signed)
Cardiac Individual Treatment Plan  Patient Details  Name: Kent [name] MRN: [id number] Date of Birth: 01/20/1956 Referring Provider:     Cardiac Rehab from 05/07/2019 in [facility] Cardiac and Pulmonary Rehab  Referring Provider  Isaias Cowman MD      Initial Encounter Date:    Cardiac Rehab from 05/07/2019 in [facility] Cardiac and Pulmonary Rehab  Date  05/07/19      Visit Diagnosis: NSTEMI (non-ST elevation myocardial infarction) [facility])  Status post coronary artery stent placement  Patient's Home Medications on Admission:  Current Outpatient Medications:  .  ALPRAZolam (XANAX) 0.25 MG tablet, Take 0.125 mg by mouth 3 (three) times daily as needed for anxiety., Disp: , Rfl:  .  aspirin EC 81 MG tablet, Take 81 mg by mouth at bedtime., Disp: , Rfl:  .  atorvastatin (LIPITOR) 80 MG tablet, Take 1 tablet (80 mg total) by mouth daily., Disp: 30 tablet, Rfl: 11 .  carvedilol (COREG) 25 MG tablet, Take 25 mg by mouth 4 (four) times daily., Disp: , Rfl:  .  clopidogrel (PLAVIX) 75 MG tablet, Take 75 mg by mouth daily., Disp: , Rfl:  .  ferrous sulfate 325 (65 FE) MG tablet, Take 325 mg by mouth 2 (two) times daily with a meal., Disp: , Rfl:  .  gabapentin (NEURONTIN) 300 MG capsule, Take 300 mg by mouth See admin instructions. Take 300 mg by mouth at supper and take 300 mg by mouth at bedtime, Disp: , Rfl:  .  HYDROcodone-acetaminophen (NORCO) 7.5-325 MG tablet, Take 0.5 tablets by mouth 3 (three) times daily as needed for moderate pain., Disp: , Rfl:  .  ibuprofen (ADVIL,MOTRIN) 200 MG tablet, Take 200-400 mg by mouth See admin instructions. Take 400 mg by mouth in the morning, take 200 mg by mouth at lunch and take 200 mg by mouth at bedtime, Disp: , Rfl:  .  isosorbide mononitrate (IMDUR) 30 MG 24 hr tablet, Take 30 mg by mouth daily., Disp: , Rfl:  .  lisinopril (PRINIVIL,ZESTRIL) 20 MG tablet, Take 20 mg by mouth daily., Disp: , Rfl:  .  Magnesium 500 MG TABS, Take 500 mg by mouth  daily., Disp: , Rfl:  .  Melatonin 10 MG CAPS, Take 10 mg by mouth at bedtime., Disp: , Rfl:  .  metFORMIN (GLUCOPHAGE) 500 MG tablet, Take 500 mg by mouth 3 (three) times daily., Disp: , Rfl:  .  methocarbamol (ROBAXIN) 500 MG tablet, Take 500 mg by mouth at bedtime., Disp: , Rfl:  .  Multiple Vitamins-Minerals (CENTRUM ADULTS PO), Take 1 tablet by mouth daily., Disp: , Rfl:  .  nitroGLYCERIN (NITROSTAT) 0.4 MG SL tablet, Place 0.4 mg under the tongue every 5 (five) minutes as needed for chest pain., Disp: , Rfl:  .  nitroGLYCERIN (NITROSTAT) 0.4 MG SL tablet, Place 1 tablet (0.4 mg total) under the tongue every 5 (five) minutes as needed for chest pain., Disp: 100 tablet, Rfl: 3 .  omeprazole (PRILOSEC) 20 MG capsule, Take 20 mg by mouth daily., Disp: , Rfl:  .  PARoxetine (PAXIL) 20 MG tablet, Take 20 mg by mouth daily., Disp: , Rfl:  .  vitamin C (ASCORBIC ACID) 250 MG tablet, Take 250 mg by mouth daily. Not sure of dose   2 gummies a day, Disp: , Rfl:   Past Medical History: Past Medical History:  Diagnosis Date  . Anemia   . Cardiovascular disease   . Diabetes mellitus ([place])    Type II  .  Hypertension   . Neuropathy due to secondary diabetes mellitus ([place])   . Osteoarthritis     Tobacco Use: Social History   Tobacco Use  Smoking Status Former Smoker  Smokeless Tobacco Never Used    Labs: Recent Review [facility] Cardiac and Pulmonary Rehab Latest Ref Rng & Units 03/30/2013   Cholestrol 0 - 200 mg/dL 124   LDLCALC 0 - 100 mg/dL 60   HDL 40 - 60 mg/dL 41   Trlycerides 0 - 200 mg/dL 115       Exercise Target Goals: Exercise Program Goal: Individual exercise prescription set using results from initial 6 min walk test and THRR while considering  patient's activity barriers and safety.   Exercise Prescription Goal: Initial exercise prescription builds to 30-45 minutes a day of aerobic activity, 2-3 days per week.  Home exercise guidelines will be  given to patient during program as part of exercise prescription that the participant will acknowledge.  Activity Barriers & Risk Stratification: Activity Barriers & Cardiac Risk Stratification - 05/07/19 1650      Activity Barriers & Cardiac Risk Stratification   Activity Barriers  Deconditioning;Muscular Weakness;Balance Concerns;History of Falls;Shortness of Breath;Assistive Device    Comments  Both legs rebuilt from knees down  cannot use the treadmill    Cardiac Risk Stratification  High       6 Minute Walk: 6 Minute Walk    Row Name 05/07/19 1645         6 Minute Walk   Phase  Initial     Distance  1320 feet     Walk Time  6 minutes     # of Rest Breaks  0     MPH  2.5     METS  3.24     RPE  12     Perceived Dyspnea   1     VO2 Peak  11.34     Symptoms  Yes (comment)     Comments  fatigue and foot drag     Resting HR  61 bpm     Resting BP  146/74     Resting Oxygen Saturation   98 %     Exercise Oxygen Saturation  during 6 min walk  96 %     Max Ex. HR  107 bpm     Max Ex. BP  154/76     2 Minute Post BP  134/62        Oxygen Initial Assessment:   Oxygen Re-Evaluation:   Oxygen Discharge (Final Oxygen Re-Evaluation):   Initial Exercise Prescription: Initial Exercise Prescription - 05/07/19 1600      Date of Initial Exercise RX and Referring Provider   Date  05/07/19    Referring Provider  Isaias Cowman MD      Recumbant Bike   Level  3    RPM  50    Watts  41    Minutes  15    METs  3      NuStep   Level  2    SPM  80    Minutes  15    METs  2.5      Arm Ergometer   Level  3    Watts  58    RPM  30    Minutes  15    METs  2.5      Recumbant Elliptical   Level  1    RPM  50    Minutes  15    METs  2.5      T5 Nustep   Level  2    SPM  80    Minutes  15    METs  2.5      Prescription Details   Frequency (times per week)  2    Duration  Progress to 30 minutes of continuous aerobic without signs/symptoms of physical  distress      Intensity   THRR 40-80% of Max Heartrate  99-136    Ratings of Perceived Exertion  11-13    Perceived Dyspnea  0-4      Progression   Progression  Continue to progress workloads to maintain intensity without signs/symptoms of physical distress.      Resistance Training   Training Prescription  Yes    Weight  3 lbs    Reps  10-15       Perform Capillary Blood Glucose checks as needed.  Exercise Prescription Changes: Exercise Prescription Changes    Row Name 05/21/19 1000 05/27/19 1400 06/03/19 1100         Response to Exercise   Blood Pressure (Admit)  138/82  -  126/70     Blood Pressure (Exercise)  142/80  -  140/76     Blood Pressure (Exit)  122/56  -  120/68     Heart Rate (Admit)  63 bpm  -  84 bpm     Heart Rate (Exercise)  93 bpm  -  94 bpm     Heart Rate (Exit)  80 bpm  -  76 bpm     Rating of Perceived Exertion (Exercise)  12  -  13     Symptoms  none  -  none     Duration  Continue with 30 min of aerobic exercise without signs/symptoms of physical distress.  -  Continue with 30 min of aerobic exercise without signs/symptoms of physical distress.     Intensity  THRR unchanged  -  THRR unchanged       Progression   Progression  Continue to progress workloads to maintain intensity without signs/symptoms of physical distress.  -  Continue to progress workloads to maintain intensity without signs/symptoms of physical distress.     Average METs  3.37  -  3.4       Resistance Training   Training Prescription  Yes  -  Yes     Weight  4 lbs  -  4 lbs     Reps  10-15  -  10-15       Interval Training   Interval Training  No  -  No       Arm Ergometer   Level  3  -  3     Minutes  15  -  15     METs  2.3  -  2.5       REL-XR   Level  2  -  2     Minutes  15  -  15     METs  2.8  -  4.9       T5 Nustep   Level  2  -  2     Minutes  15  -  15     METs  2  -  2.2       Biostep-RELP   Level  3  -  4     Minutes  15  -  15     METs  3  -  4        Home Exercise Plan   Plans to continue exercise at  -  Home (comment) walking  Home (comment) walking     Frequency  -  Add 3 additional days to program exercise sessions.  Add 3 additional days to program exercise sessions.     Initial Home Exercises Provided  -  05/27/19  05/27/19        Exercise Comments: Exercise Comments    Row Name 05/13/19 1455           Exercise Comments  First full day of exercise!  Patient was oriented to gym and equipment including functions, settings, policies, and procedures.  Patient's individual exercise prescription and treatment plan were reviewed.  All starting workloads were established based on the results of the 6 minute walk test done at initial orientation visit.  The plan for exercise progression was also introduced and progression will be customized based on patient's performance and goals          Exercise Goals and Review: Exercise Goals    Row Name 05/07/19 1655             Exercise Goals   Increase Physical Activity  Yes       Intervention  Provide advice, education, support and counseling about physical activity/exercise needs.;Develop an individualized exercise prescription for aerobic and resistive training based on initial evaluation findings, risk stratification, comorbidities and participant's personal goals.       Expected Outcomes  Short Term: Attend rehab on a regular basis to increase amount of physical activity.;Long Term: Add in home exercise to make exercise part of routine and to increase amount of physical activity.;Long Term: Exercising regularly at least 3-5 days a week.       Increase Strength and Stamina  Yes       Intervention  Provide advice, education, support and counseling about physical activity/exercise needs.;Develop an individualized exercise prescription for aerobic and resistive training based on initial evaluation findings, risk stratification, comorbidities and participant's personal goals.        Expected Outcomes  Short Term: Increase workloads from initial exercise prescription for resistance, speed, and METs.;Short Term: Perform resistance training exercises routinely during rehab and add in resistance training at home;Long Term: Improve cardiorespiratory fitness, muscular endurance and strength as measured by increased METs and functional capacity (6MWT)       Able to understand and use rate of perceived exertion (RPE) scale  Yes       Intervention  Provide education and explanation on how to use RPE scale       Expected Outcomes  Short Term: Able to use RPE daily in rehab to express subjective intensity level;Long Term:  Able to use RPE to guide intensity level when exercising independently       Able to understand and use Dyspnea scale  Yes       Intervention  Provide education and explanation on how to use Dyspnea scale       Expected Outcomes  Short Term: Able to use Dyspnea scale daily in rehab to express subjective sense of shortness of breath during exertion;Long Term: Able to use Dyspnea scale to guide intensity level when exercising independently       Knowledge and understanding of Target Heart Rate Range (THRR)  Yes       Intervention  Provide education and explanation of THRR including how the numbers  were predicted and where they are located for reference       Expected Outcomes  Short Term: Able to use daily as guideline for intensity in rehab;Short Term: Able to state/look up THRR;Long Term: Able to use THRR to govern intensity when exercising independently       Able to check pulse independently  Yes       Intervention  Review the importance of being able to check your own pulse for safety during independent exercise;Provide education and demonstration on how to check pulse in carotid and radial arteries.       Expected Outcomes  Short Term: Able to explain why pulse checking is important during independent exercise;Long Term: Able to check pulse independently and accurately        Understanding of Exercise Prescription  Yes       Intervention  Provide education, explanation, and written materials on patient's individual exercise prescription       Expected Outcomes  Short Term: Able to explain program exercise prescription;Long Term: Able to explain home exercise prescription to exercise independently          Exercise Goals Re-Evaluation : Exercise Goals Re-Evaluation    Row Name 05/13/19 1456 05/21/19 0918 05/27/19 1410 06/03/19 1152       Exercise Goal Re-Evaluation   Exercise Goals Review  Able to understand and use rate of perceived exertion (RPE) scale;Knowledge and understanding of Target Heart Rate Range (THRR);Understanding of Exercise Prescription  Increase Strength and Stamina;Increase Physical Activity;Understanding of Exercise Prescription  Increase Strength and Stamina;Increase Physical Activity;Understanding of Exercise Prescription  Increase Physical Activity;Increase Strength and Stamina;Understanding of Exercise Prescription    Comments  Reviewed RPE scale, THR and program prescription with pt today.  Pt voiced understanding and was given a copy of goals to take home.  Kent [name] is off to a good start in rehab. He has completed three full days of exercise.  He is already up to 2.8 METs on the XR and getting into the routine.  We will continue to montior his progression.  Kent [name] is doing well in rehab.  He is starting to feel better and is staying active at home. Reviewed home exercise with pt today.  Pt plans to walking at home for exercise.  Reviewed THR, pulse, RPE, sign and symptoms, NTG use, and when to call 911 or MD.  Also discussed weather considerations and indoor options.  Pt voiced understanding.  Kent [name] is off to a good start in rehab.  He is now able to complete all 30 min each day without stopping.  He is up to 4.9 METs on the XR.  We will continue to monitor his progress.    Expected Outcomes  Short: Use RPE daily to regulate intensity. Long: Follow  program prescription in THR.  Short: Continue to attend regularly.  Long: Continue follow program prescription.  Short: Continue to walk daily.  Long: Continue to increase strength.  Short: Continue to attend regularly.  Long: Continue to increase stamina.       Discharge Exercise Prescription (Final Exercise Prescription Changes): Exercise Prescription Changes - 06/03/19 1100      Response to Exercise   Blood Pressure (Admit)  126/70    Blood Pressure (Exercise)  140/76    Blood Pressure (Exit)  120/68    Heart Rate (Admit)  84 bpm    Heart Rate (Exercise)  94 bpm    Heart Rate (Exit)  76 bpm    Rating of Perceived Exertion (Exercise)  13    Symptoms  none    Duration  Continue with 30 min of aerobic exercise without signs/symptoms of physical distress.    Intensity  THRR unchanged      Progression   Progression  Continue to progress workloads to maintain intensity without signs/symptoms of physical distress.    Average METs  3.4      Resistance Training   Training Prescription  Yes    Weight  4 lbs    Reps  10-15      Interval Training   Interval Training  No      Arm Ergometer   Level  3    Minutes  15    METs  2.5      REL-XR   Level  2    Minutes  15    METs  4.9      T5 Nustep   Level  2    Minutes  15    METs  2.2      Biostep-RELP   Level  4    Minutes  15    METs  4      Home Exercise Plan   Plans to continue exercise at  Home (comment)   walking   Frequency  Add 3 additional days to program exercise sessions.    Initial Home Exercises Provided  05/27/19       Nutrition:  Target Goals: Understanding of nutrition guidelines, daily intake of sodium <1564m, cholesterol <2086m calories 30% from fat and 7% or less from saturated fats, daily to have 5 or more servings of fruits and vegetables.  Biometrics: Pre Biometrics - 05/07/19 1656      Pre Biometrics   Height  5' 11.25" (1.81 m)    Weight  230 lb (104.3 kg)    BMI (Calculated)  31.84         Nutrition Therapy Plan and Nutrition Goals: Nutrition Therapy & Goals - 05/07/19 1639      Nutrition Therapy   Diet  Low Na, HH, diabetic diet    Protein (specify units)  85g    Fiber  25 grams    Whole Grain Foods  3 servings    Saturated Fats  12 max. grams    Fruits and Vegetables  5 servings/day    Sodium  1.5 grams      Personal Nutrition Goals   Nutrition Goal  ST: stop eating take out burgers (talked about switching to chicken at [employer]) LT: increase stamina    Comments  Pt reports eating fast food for almost every meal due to wife not wanting healthy meals. Pt reports loving green leafy vegetables and fruit. Pt will make homemade sorbet at night. Discussed HH eating, MyPlate, low Na, healthy snacking, BG control, and suggested meal prepping or cooking foods and eating something different from his wife. Pt reports eating fruit at work (catelope and watermelon). Pt does not have teeth but can eat somethings well. possible goal to eat 2 snacks during the day to help maintain BG. Pt reports that his doctor said he isn't needed enough- BG may be low. On metformin. pt does his best to choose healthy options when eating out such as vegetables, taking skin of chicken, etc.      Intervention Plan   Intervention  Prescribe, educate and counsel regarding individualized specific dietary modifications aiming towards targeted core components such as weight, hypertension, lipid management, diabetes, heart failure and other comorbidities.;Nutrition handout(s) given to patient.  Expected Outcomes  Short Term Goal: Understand basic principles of dietary content, such as calories, fat, sodium, cholesterol and nutrients.;Short Term Goal: A plan has been developed with personal nutrition goals set during dietitian appointment.;Long Term Goal: Adherence to prescribed nutrition plan.       Nutrition Assessments:   Nutrition Goals Re-Evaluation: Nutrition Goals Re-Evaluation    Row Name  05/15/19 1516 06/10/19 1416           Goals   Nutrition Goal  ST: stop eating take out burgers (talked about switching to chicken at [employer]) LT: increase stamina  ST: try best when eating out to choose healthy options LT: increase stamina      Comment  Talked to pt again about whole grains and fiber and BG, reiterated that pt DR may say that he isn't eating enough due to Bg being low at times, talked about eating consistenly and good food choices to maintain BG. Pt reports still eating the best he can; eating lots of vegetables choosing chicken without skin, but will have white rice as he doesn't like brown rice.  Talked to pt again about whole grains and fiber and BG, as well as general healthy choices; discussed on the go healthy options, pt was not interested and insists he will continue with take out for lunch especially since his wife is very picky. Pt reports still eating the best he can when he cooks; eating lots of vegetables choosing chicken without skin, but will have white rice as he doesn't like brown rice.      Expected Outcome  switch to chicken instead of burgers when getting takeout LT: increase stamina, maintain BG  ST: try best when eating out to choose healthy options LT: increase stamina         Nutrition Goals Discharge (Final Nutrition Goals Re-Evaluation): Nutrition Goals Re-Evaluation - 06/10/19 1416      Goals   Nutrition Goal  ST: try best when eating out to choose healthy options LT: increase stamina    Comment  Talked to pt again about whole grains and fiber and BG, as well as general healthy choices; discussed on the go healthy options, pt was not interested and insists he will continue with take out for lunch especially since his wife is very picky. Pt reports still eating the best he can when he cooks; eating lots of vegetables choosing chicken without skin, but will have white rice as he doesn't like brown rice.    Expected Outcome  ST: try best when eating out to  choose healthy options LT: increase stamina       Psychosocial: Target Goals: Acknowledge presence or absence of significant depression and/or stress, maximize coping skills, provide positive support system. Participant is able to verbalize types and ability to use techniques and skills needed for reducing stress and depression.   Initial Review & Psychosocial Screening:   Quality of Life Scores:   Scores of 19 and below usually indicate a poorer quality of life in these areas.  A difference of  2-3 points is a clinically meaningful difference.  A difference of 2-3 points in the total score of the Quality of Life Index has been associated with significant improvement in overall quality of life, self-image, physical symptoms, and general health in studies assessing change in quality of life.  PHQ-9: Recent Review Flowsheet Data    There is no flowsheet data to display.     Interpretation of Total Score  Total Score Depression Severity:  1-4 = Minimal depression, 5-9 = Mild depression, 10-14 = Moderate depression, 15-19 = Moderately severe depression, 20-27 = Severe depression   Psychosocial Evaluation and Intervention:   Psychosocial Re-Evaluation: Psychosocial Re-Evaluation    [place] Name 05/27/19 1415             Psychosocial Re-Evaluation   Current issues with  Current Stress Concerns       Comments  Kent [name] is doing well in rehab.  He walks with his dog daily.  He does not let stress get to him and stays positive overall.  He sleeps good and is compliant with his CPAP.       Expected Outcomes  Short: Continue to practice self care.  Long: Continue to stay positive.       Interventions  Encouraged to attend Cardiac Rehabilitation for the exercise       Continue Psychosocial Services   Follow up required by staff          Psychosocial Discharge (Final Psychosocial Re-Evaluation): Psychosocial Re-Evaluation - 05/27/19 1415      Psychosocial Re-Evaluation   Current issues with   Current Stress Concerns    Comments  Kent [name] is doing well in rehab.  He walks with his dog daily.  He does not let stress get to him and stays positive overall.  He sleeps good and is compliant with his CPAP.    Expected Outcomes  Short: Continue to practice self care.  Long: Continue to stay positive.    Interventions  Encouraged to attend Cardiac Rehabilitation for the exercise    Continue Psychosocial Services   Follow up required by staff       Vocational Rehabilitation: Provide vocational rehab assistance to qualifying candidates.   Vocational Rehab Evaluation & Intervention: Vocational Rehab - 04/29/19 1434      Initial Vocational Rehab Evaluation & Intervention   Assessment shows need for Vocational Rehabilitation  No       Education: Education Goals: Education classes will be provided on a variety of topics geared toward better understanding of heart health and risk factor modification. Participant will state understanding/return demonstration of topics presented as noted by education test scores.  Learning Barriers/Preferences:   Education Topics:  AED/CPR: - Group verbal and written instruction with the use of models to demonstrate the basic use of the AED with the basic ABC's of resuscitation.   General Nutrition Guidelines/Fats and Fiber: -Group instruction provided by verbal, written material, models and posters to present the general guidelines for heart healthy nutrition. Gives an explanation and review of dietary fats and fiber.   Controlling Sodium/Reading Food Labels: -Group verbal and written material supporting the discussion of sodium use in heart healthy nutrition. Review and explanation with models, verbal and written materials for utilization of the food label.   Exercise Physiology & General Exercise Guidelines: - Group verbal and written instruction with models to review the exercise physiology of the cardiovascular system and associated critical  values. Provides general exercise guidelines with specific guidelines to those with heart or lung disease.    Aerobic Exercise & Resistance Training: - Gives group verbal and written instruction on the various components of exercise. Focuses on aerobic and resistive training programs and the benefits of this training and how to safely progress through these programs..   Flexibility, Balance, Mind/Body Relaxation: Provides group verbal/written instruction on the benefits of flexibility and balance training, including mind/body exercise modes such as yoga, pilates and tai chi.  Demonstration and skill practice provided.  Stress and Anxiety: - Provides group verbal and written instruction about the health risks of elevated stress and causes of high stress.  Discuss the correlation between heart/lung disease and anxiety and treatment options. Review healthy ways to manage with stress and anxiety.   Depression: - Provides group verbal and written instruction on the correlation between heart/lung disease and depressed mood, treatment options, and the stigmas associated with seeking treatment.   Anatomy & Physiology of the Heart: - Group verbal and written instruction and models provide basic cardiac anatomy and physiology, with the coronary electrical and arterial systems. Review of Valvular disease and Heart Failure   Cardiac Procedures: - Group verbal and written instruction to review commonly prescribed medications for heart disease. Reviews the medication, class of the drug, and side effects. Includes the steps to properly store meds and maintain the prescription regimen. (beta blockers and nitrates)   Cardiac Medications I: - Group verbal and written instruction to review commonly prescribed medications for heart disease. Reviews the medication, class of the drug, and side effects. Includes the steps to properly store meds and maintain the prescription regimen.   Cardiac Medications  II: -Group verbal and written instruction to review commonly prescribed medications for heart disease. Reviews the medication, class of the drug, and side effects. (all other drug classes)    Go Sex-Intimacy & Heart Disease, Get SMART - Goal Setting: - Group verbal and written instruction through game format to discuss heart disease and the return to sexual intimacy. Provides group verbal and written material to discuss and apply goal setting through the application of the S.M.A.R.T. Method.   Other Matters of the Heart: - Provides group verbal, written materials and models to describe Stable Angina and Peripheral Artery. Includes description of the disease process and treatment options available to the cardiac patient.   Exercise & Equipment Safety: - Individual verbal instruction and demonstration of equipment use and safety with use of the equipment.   Cardiac Rehab from 05/07/2019 in [facility] Cardiac and Pulmonary Rehab  Date  05/07/19  Educator  [facility]  Instruction Review Code  1- Verbalizes Understanding      Infection Prevention: - Provides verbal and written material to individual with discussion of infection control including proper hand washing and proper equipment cleaning during exercise session.   Cardiac Rehab from 05/07/2019 in [facility] Cardiac and Pulmonary Rehab  Date  05/07/19  Educator  [facility]  Instruction Review Code  1- Verbalizes Understanding      Falls Prevention: - Provides verbal and written material to individual with discussion of falls prevention and safety.   Cardiac Rehab from 05/07/2019 in [facility] Cardiac and Pulmonary Rehab  Date  05/07/19  Educator  [facility]  Instruction Review Code  1- Verbalizes Understanding      Diabetes: - Individual verbal and written instruction to review signs/symptoms of diabetes, desired ranges of glucose level fasting, after meals and with exercise. Acknowledge that pre and post exercise glucose checks will be done for 3 sessions at entry of  program.   Cardiac Rehab from 05/07/2019 in [facility] Cardiac and Pulmonary Rehab  Date  05/07/19  Educator  [facility]  Instruction Review Code  1- Verbalizes Understanding      Know Your Numbers and Risk Factors: -Group verbal and written instruction about important numbers in your health.  Discussion of what are risk factors and how they play a role in the disease process.  Review of Cholesterol, Blood Pressure, Diabetes, and BMI and the role they play in  your overall health.   Sleep Hygiene: -Provides group verbal and written instruction about how sleep can affect your health.  Define sleep hygiene, discuss sleep cycles and impact of sleep habits. Review good sleep hygiene tips.    Other: -Provides group and verbal instruction on various topics (see comments)   Knowledge Questionnaire Score:   Core Components/Risk Factors/Patient Goals at Admission: Personal Goals and Risk Factors at Admission - 05/07/19 1656      Core Components/Risk Factors/Patient Goals on Admission    Weight Management  Yes;Obesity;Weight Loss    Intervention  Weight Management: Develop a combined nutrition and exercise program designed to reach desired caloric intake, while maintaining appropriate intake of nutrient and fiber, sodium and fats, and appropriate energy expenditure required for the weight goal.;Weight Management: Provide education and appropriate resources to help participant work on and attain dietary goals.;Obesity: Provide education and appropriate resources to help participant work on and attain dietary goals.;Weight Management/Obesity: Establish reasonable short term and long term weight goals.    Admit Weight  230 lb (104.3 kg)    Goal Weight: Short Term  225 lb (102.1 kg)    Goal Weight: Long Term  220 lb (99.8 kg)    Expected Outcomes  Short Term: Continue to assess and modify interventions until short term weight is achieved;Long Term: Adherence to nutrition and physical activity/exercise program aimed  toward attainment of established weight goal;Weight Loss: Understanding of general recommendations for a balanced deficit meal plan, which promotes 1-2 lb weight loss per week and includes a negative energy balance of [phone] kcal/d;Understanding recommendations for meals to include 15-35% energy as protein, 25-35% energy from fat, 35-60% energy from carbohydrates, less than 238m of dietary cholesterol, 20-35 gm of total fiber daily;Understanding of distribution of calorie intake throughout the day with the consumption of 4-5 meals/snacks    Diabetes  Yes    Intervention  Provide education about signs/symptoms and action to take for hypo/hyperglycemia.;Provide education about proper nutrition, including hydration, and aerobic/resistive exercise prescription along with prescribed medications to achieve blood glucose in normal ranges: Fasting glucose 65-99 mg/dL    Expected Outcomes  Short Term: Participant verbalizes understanding of the signs/symptoms and immediate care of hyper/hypoglycemia, proper foot care and importance of medication, aerobic/resistive exercise and nutrition plan for blood glucose control.;Long Term: Attainment of HbA1C < 7%.    Hypertension  Yes    Intervention  Provide education on lifestyle modifcations including regular physical activity/exercise, weight management, moderate sodium restriction and increased consumption of fresh fruit, vegetables, and low fat dairy, alcohol moderation, and smoking cessation.;Monitor prescription use compliance.    Expected Outcomes  Short Term: Continued assessment and intervention until BP is < 140/955mHG in hypertensive participants. < 130/8080mG in hypertensive participants with diabetes, heart failure or chronic kidney disease.;Long Term: Maintenance of blood pressure at goal levels.    Lipids  Yes    Intervention  Provide education and support for participant on nutrition & aerobic/resistive exercise along with prescribed medications to  achieve LDL <80m8mDL >40mg69m Expected Outcomes  Short Term: Participant states understanding of desired cholesterol values and is compliant with medications prescribed. Participant is following exercise prescription and nutrition guidelines.;Long Term: Cholesterol controlled with medications as prescribed, with individualized exercise RX and with personalized nutrition plan. Value goals: LDL < 80mg,42m > 40 mg.       Core Components/Risk Factors/Patient Goals Review:  Goals and Risk Factor Review    Row Name 05/27/19 [phone]  Core Components/Risk Factors/Patient Goals Review   Personal Goals Review  Weight Management/Obesity;Hypertension;Diabetes       Review  Kent [name] is doing well at home.  His weight stays between 215-218lbs.  Overall, his blood sugars have been good and he has not noticed any lows in his system.  Blood pressures have been good and he checks them daily at home in the morning along with his sugars. He is doing well on his medications.       Expected Outcomes  Short: Continue to work on weight loss.  Long: Continue to manage diabetes.          Core Components/Risk Factors/Patient Goals at Discharge (Final Review):  Goals and Risk Factor Review - 05/27/19 1417      Core Components/Risk Factors/Patient Goals Review   Personal Goals Review  Weight Management/Obesity;Hypertension;Diabetes    Review  Kent [name] is doing well at home.  His weight stays between 215-218lbs.  Overall, his blood sugars have been good and he has not noticed any lows in his system.  Blood pressures have been good and he checks them daily at home in the morning along with his sugars. He is doing well on his medications.    Expected Outcomes  Short: Continue to work on weight loss.  Long: Continue to manage diabetes.       ITP Comments: ITP Comments    Row Name 04/29/19 1432 05/07/19 1634 05/15/19 1412 06/12/19 0488     ITP Comments  Initial Virtual Vist completed today   Obtained consent  reviewd Risk factors and set appt for EP and RD Followup.  Completed 6MWT and nutrition evaluations today.  Documentation for diagnosis can be found in [facility] encounter 04/01/2019.  Initial ITP created and sent for review to Dr. Emily Filbert, Medical Director.  30 day review cycle restarting  after being closed since March 16 because of  Covid 19 pandemic. Program opened to patients on July 6. Not all have returned. ITP updated and sent to Medical Director for review,changes as needed and signature  30 Day Review Completed today. Continue with ITP unless changed by Medical Director review.       Comments:

## 2019-06-12 NOTE — Progress Notes (Signed)
Daily Session Note  Patient Details  Name: Kent [name] MRN: [id number] Date of Birth: 1956/10/10 Referring Provider:     Cardiac Rehab from 05/07/2019 in [facility] Cardiac and Pulmonary Rehab  Referring Provider  Isaias Cowman MD      Encounter Date: 06/12/2019  Check In:      Social History   Tobacco Use  Smoking Status Former Smoker  Smokeless Tobacco Never Used    Goals Met:  Independence with exercise equipment Exercise tolerated well No report of cardiac concerns or symptoms Strength training completed today  Goals Unmet:  Not Applicable  Comments: Pt able to follow exercise prescription today without complaint.  Will continue to monitor for progression.    Dr. Emily Filbert is Medical Director for [place] and [facility].

## 2019-06-17 ENCOUNTER — Encounter: Payer: Medicare Other | Admitting: *Deleted

## 2019-06-17 ENCOUNTER — Other Ambulatory Visit: Payer: Self-pay

## 2019-06-17 DIAGNOSIS — I214 Non-ST elevation (NSTEMI) myocardial infarction: Secondary | ICD-10-CM

## 2019-06-17 DIAGNOSIS — Z955 Presence of coronary angioplasty implant and graft: Secondary | ICD-10-CM

## 2019-06-17 NOTE — Progress Notes (Signed)
Daily Session Note  Patient Details  Name: Kent [name] MRN: [id number] Date of Birth: 05/31/1956 Referring Provider:     Cardiac Rehab from 05/07/2019 in [facility] Cardiac and Pulmonary Rehab  Referring Provider  Isaias Cowman MD      Encounter Date: 06/17/2019  Check In: Session Check In - 06/17/19 [place]      Check-In   Supervising physician immediately available to respond to emergencies  See telemetry face sheet for immediately available ER MD    Location  [facility]-Cardiac & Pulmonary Rehab    Staff Present  Jasper Loser BS, Exercise Physiologist;Susanne Bice, RN, BSN, Laveda Norman, BS, ACSM CEP, Exercise Physiologist    Virtual Visit  No    Medication changes reported      No    Fall or balance concerns reported     No    Warm-up and Cool-down  Performed on first and last piece of equipment    Resistance Training Performed  Yes    VAD Patient?  No    PAD/SET Patient?  No      Pain Assessment   Currently in Pain?  No/denies          Social History   Tobacco Use  Smoking Status Former Smoker  Smokeless Tobacco Never Used    Goals Met:  Independence with exercise equipment Exercise tolerated well Personal goals reviewed No report of cardiac concerns or symptoms Strength training completed today  Goals Unmet:  Not Applicable  Comments: Pt able to follow exercise prescription today without complaint.  Will continue to monitor for progression.    Dr. Emily Filbert is Medical Director for [place] and [facility].

## 2019-06-19 ENCOUNTER — Other Ambulatory Visit: Payer: Self-pay

## 2019-06-19 ENCOUNTER — Encounter: Payer: Medicare Other | Admitting: *Deleted

## 2019-06-19 DIAGNOSIS — I214 Non-ST elevation (NSTEMI) myocardial infarction: Secondary | ICD-10-CM | POA: Diagnosis not present

## 2019-06-19 DIAGNOSIS — Z955 Presence of coronary angioplasty implant and graft: Secondary | ICD-10-CM

## 2019-06-19 NOTE — Progress Notes (Signed)
Daily Session Note  Patient Details  Name: Kent [name] MRN: [id number] Date of Birth: 08/08/1956 Referring Provider:     Cardiac Rehab from 05/07/2019 in [facility] Cardiac and Pulmonary Rehab  Referring Provider  Kent Cowman MD      Encounter Date: 06/19/2019  Check In: Session Check In - 06/19/19 1408      Check-In   Supervising physician immediately available to respond to emergencies  See telemetry face sheet for immediately available ER MD    Location  [facility]-Cardiac & Pulmonary Rehab    Staff Present  Kent Lark, RN, BSN, CCRP;Kent [name] BS, Exercise Physiologist;Kent [name], BA, ACSM CEP, Exercise Physiologist    Virtual Visit  No    Fall or balance concerns reported     No    Warm-up and Cool-down  Performed on first and last piece of equipment    Resistance Training Performed  Yes    VAD Patient?  No    PAD/SET Patient?  No      Pain Assessment   Currently in Pain?  No/denies          Social History   Tobacco Use  Smoking Status Former Smoker  Smokeless Tobacco Never Used    Goals Met:  Independence with exercise equipment Exercise tolerated well No report of cardiac concerns or symptoms Strength training completed today  Goals Unmet:  Not Applicable  Comments: Kent [name] says he will not be here Monday as he has spinal injections that day.  WIll need to see when he can exercise again.    Dr. Emily [name] is Medical Director for [place] and [facility].

## 2019-06-20 ENCOUNTER — Encounter: Payer: Self-pay | Admitting: *Deleted

## 2019-06-20 NOTE — Progress Notes (Signed)
Note sent to Dr Sharlet Salina:  Kent [name] is exercising with our Cardiac Rehab program. He told us he is receiving Spinal injection on Monday. Will there be any limits to his returning to the program and exercising after the injection?  [phone] Fax   For response or can [facility]  message me.  Thank you, Heath Lark RN

## 2019-06-26 ENCOUNTER — Telehealth: Payer: Self-pay | Admitting: *Deleted

## 2019-06-26 NOTE — Telephone Encounter (Signed)
Kent [name] called [facility] staff to state he will not be in class today, but is planning on coming Monday.

## 2019-07-01 ENCOUNTER — Encounter: Payer: Medicare Other | Admitting: *Deleted

## 2019-07-01 ENCOUNTER — Other Ambulatory Visit: Payer: Self-pay

## 2019-07-01 DIAGNOSIS — I214 Non-ST elevation (NSTEMI) myocardial infarction: Secondary | ICD-10-CM

## 2019-07-01 DIAGNOSIS — Z955 Presence of coronary angioplasty implant and graft: Secondary | ICD-10-CM

## 2019-07-01 NOTE — Progress Notes (Signed)
Daily Session Note  Patient Details  Name: Kent [name] MRN: [id number] Date of Birth: 07-26-1956 Referring Provider:     Cardiac Rehab from 05/07/2019 in [facility] Cardiac and Pulmonary Rehab  Referring Provider  Isaias Cowman MD      Encounter Date: 07/01/2019  Check In: Session Check In - 07/01/19 1357      Check-In   Supervising physician immediately available to respond to emergencies  See telemetry face sheet for immediately available ER MD    Location  [facility]-Cardiac & Pulmonary Rehab    Staff Present  Renita Papa, RN Moises Blood, BS, ACSM CEP, Exercise Physiologist;Jeanna Durrell BS, Exercise Physiologist    Virtual Visit  No    Medication changes reported      No    Fall or balance concerns reported     No    Warm-up and Cool-down  Performed on first and last piece of equipment    Resistance Training Performed  Yes    VAD Patient?  No    PAD/SET Patient?  No      Pain Assessment   Currently in Pain?  No/denies          Social History   Tobacco Use  Smoking Status Former Smoker  Smokeless Tobacco Never Used    Goals Met:  Independence with exercise equipment Exercise tolerated well No report of cardiac concerns or symptoms Strength training completed today  Goals Unmet:  Not Applicable  Comments: Pt able to follow exercise prescription today without complaint.  Will continue to monitor for progression.    Dr. Emily Filbert is Medical Director for [place] and [facility].

## 2019-07-03 ENCOUNTER — Other Ambulatory Visit: Payer: Self-pay

## 2019-07-03 ENCOUNTER — Encounter: Payer: Medicare Other | Attending: Cardiology | Admitting: *Deleted

## 2019-07-03 DIAGNOSIS — M1990 Unspecified osteoarthritis, unspecified site: Secondary | ICD-10-CM | POA: Insufficient documentation

## 2019-07-03 DIAGNOSIS — Z87891 Personal history of nicotine dependence: Secondary | ICD-10-CM | POA: Diagnosis not present

## 2019-07-03 DIAGNOSIS — Z7982 Long term (current) use of aspirin: Secondary | ICD-10-CM | POA: Diagnosis not present

## 2019-07-03 DIAGNOSIS — I214 Non-ST elevation (NSTEMI) myocardial infarction: Secondary | ICD-10-CM | POA: Diagnosis present

## 2019-07-03 DIAGNOSIS — Z79899 Other long term (current) drug therapy: Secondary | ICD-10-CM | POA: Insufficient documentation

## 2019-07-03 DIAGNOSIS — E1140 Type 2 diabetes mellitus with diabetic neuropathy, unspecified: Secondary | ICD-10-CM | POA: Insufficient documentation

## 2019-07-03 DIAGNOSIS — Z7902 Long term (current) use of antithrombotics/antiplatelets: Secondary | ICD-10-CM | POA: Diagnosis not present

## 2019-07-03 DIAGNOSIS — Z955 Presence of coronary angioplasty implant and graft: Secondary | ICD-10-CM | POA: Diagnosis not present

## 2019-07-03 DIAGNOSIS — I10 Essential (primary) hypertension: Secondary | ICD-10-CM | POA: Insufficient documentation

## 2019-07-03 DIAGNOSIS — Z7984 Long term (current) use of oral hypoglycemic drugs: Secondary | ICD-10-CM | POA: Diagnosis not present

## 2019-07-03 NOTE — Progress Notes (Signed)
Daily Session Note  Patient Details  Name: Kent [name] MRN: [id number] Date of Birth: [birth year] Referring Provider:     Cardiac Rehab from 05/07/2019 in [facility] Cardiac and Pulmonary Rehab  Referring Provider  Isaias Cowman MD      Encounter Date: 07/03/2019  Check In: Session Check In - 07/03/19 1356      Check-In   Supervising physician immediately available to respond to emergencies  See telemetry face sheet for immediately available ER MD    Location  [facility]-Cardiac & Pulmonary Rehab    Staff Present  Renita Papa, RN BSN;Joseph Foy [place], [place], ACSM CEP, Exercise Physiologist    Virtual Visit  No    Medication changes reported      No    Fall or balance concerns reported     No    Warm-up and Cool-down  Performed on first and last piece of equipment    Resistance Training Performed  Yes    VAD Patient?  No    PAD/SET Patient?  No      Pain Assessment   Currently in Pain?  No/denies          Social History   Tobacco Use  Smoking Status Former Smoker  Smokeless Tobacco Never Used    Goals Met:  Independence with exercise equipment Exercise tolerated well No report of cardiac concerns or symptoms Strength training completed today  Goals Unmet:  Not Applicable  Comments: Pt able to follow exercise prescription today without complaint.  Will continue to monitor for progression.    Dr. Emily Filbert is Medical Director for [place] and [facility].

## 2019-07-10 ENCOUNTER — Encounter: Payer: Self-pay | Admitting: *Deleted

## 2019-07-10 ENCOUNTER — Other Ambulatory Visit: Payer: Self-pay

## 2019-07-10 ENCOUNTER — Encounter: Payer: Medicare Other | Admitting: *Deleted

## 2019-07-10 DIAGNOSIS — I214 Non-ST elevation (NSTEMI) myocardial infarction: Secondary | ICD-10-CM

## 2019-07-10 DIAGNOSIS — Z955 Presence of coronary angioplasty implant and graft: Secondary | ICD-10-CM

## 2019-07-10 NOTE — Progress Notes (Signed)
Daily Session Note  Patient Details  Name: Kent [name] MRN: [id number] Date of Birth: June 30, 1956 Referring Provider:     Cardiac Rehab from 05/07/2019 in [facility] Cardiac and Pulmonary Rehab  Referring Provider  Isaias Cowman MD      Encounter Date: 07/10/2019  Check In: Session Check In - 07/10/19 1356      Check-In   Supervising physician immediately available to respond to emergencies  See telemetry face sheet for immediately available ER MD    Location  [facility]-Cardiac & Pulmonary Rehab    Staff Present  Renita Papa, RN BSN;Joseph Foy [place], [place], ACSM CEP, Exercise Physiologist    Virtual Visit  No    Medication changes reported      No    Fall or balance concerns reported     No    Warm-up and Cool-down  Performed on first and last piece of equipment    Resistance Training Performed  Yes    VAD Patient?  No    PAD/SET Patient?  No      Pain Assessment   Currently in Pain?  No/denies          Social History   Tobacco Use  Smoking Status Former Smoker  Smokeless Tobacco Never Used    Goals Met:  Independence with exercise equipment Exercise tolerated well No report of cardiac concerns or symptoms Strength training completed today  Goals Unmet:  Not Applicable  Comments: Pt able to follow exercise prescription today without complaint.  Will continue to monitor for progression.    Dr. Emily Filbert is Medical Director for [place] and [facility].

## 2019-07-10 NOTE — Progress Notes (Signed)
Cardiac Individual Treatment Plan  Patient Details  Name: Fouad Taul MRN: [id number] Date of Birth: 01/20/1956 Referring Provider:     Cardiac Rehab from 05/07/2019 in [facility] Cardiac and Pulmonary Rehab  Referring Provider  Isaias Cowman MD      Initial Encounter Date:    Cardiac Rehab from 05/07/2019 in [facility] Cardiac and Pulmonary Rehab  Date  05/07/19      Visit Diagnosis: NSTEMI (non-ST elevation myocardial infarction) [facility])  Status post coronary artery stent placement  Patient's Home Medications on Admission:  Current Outpatient Medications:  .  ALPRAZolam (XANAX) 0.25 MG tablet, Take 0.125 mg by mouth 3 (three) times daily as needed for anxiety., Disp: , Rfl:  .  aspirin EC 81 MG tablet, Take 81 mg by mouth at bedtime., Disp: , Rfl:  .  atorvastatin (LIPITOR) 80 MG tablet, Take 1 tablet (80 mg total) by mouth daily., Disp: 30 tablet, Rfl: 11 .  carvedilol (COREG) 25 MG tablet, Take 25 mg by mouth 4 (four) times daily., Disp: , Rfl:  .  clopidogrel (PLAVIX) 75 MG tablet, Take 75 mg by mouth daily., Disp: , Rfl:  .  ferrous sulfate 325 (65 FE) MG tablet, Take 325 mg by mouth 2 (two) times daily with a meal., Disp: , Rfl:  .  gabapentin (NEURONTIN) 300 MG capsule, Take 300 mg by mouth See admin instructions. Take 300 mg by mouth at supper and take 300 mg by mouth at bedtime, Disp: , Rfl:  .  HYDROcodone-acetaminophen (NORCO) 7.5-325 MG tablet, Take 0.5 tablets by mouth 3 (three) times daily as needed for moderate pain., Disp: , Rfl:  .  ibuprofen (ADVIL,MOTRIN) 200 MG tablet, Take 200-400 mg by mouth See admin instructions. Take 400 mg by mouth in the morning, take 200 mg by mouth at lunch and take 200 mg by mouth at bedtime, Disp: , Rfl:  .  isosorbide mononitrate (IMDUR) 30 MG 24 hr tablet, Take 30 mg by mouth daily., Disp: , Rfl:  .  lisinopril (PRINIVIL,ZESTRIL) 20 MG tablet, Take 20 mg by mouth daily., Disp: , Rfl:  .  Magnesium 500 MG TABS, Take 500 mg by mouth  daily., Disp: , Rfl:  .  Melatonin 10 MG CAPS, Take 10 mg by mouth at bedtime., Disp: , Rfl:  .  metFORMIN (GLUCOPHAGE) 500 MG tablet, Take 500 mg by mouth 3 (three) times daily., Disp: , Rfl:  .  methocarbamol (ROBAXIN) 500 MG tablet, Take 500 mg by mouth at bedtime., Disp: , Rfl:  .  Multiple Vitamins-Minerals (CENTRUM ADULTS PO), Take 1 tablet by mouth daily., Disp: , Rfl:  .  nitroGLYCERIN (NITROSTAT) 0.4 MG SL tablet, Place 0.4 mg under the tongue every 5 (five) minutes as needed for chest pain., Disp: , Rfl:  .  nitroGLYCERIN (NITROSTAT) 0.4 MG SL tablet, Place 1 tablet (0.4 mg total) under the tongue every 5 (five) minutes as needed for chest pain., Disp: 100 tablet, Rfl: 3 .  omeprazole (PRILOSEC) 20 MG capsule, Take 20 mg by mouth daily., Disp: , Rfl:  .  PARoxetine (PAXIL) 20 MG tablet, Take 20 mg by mouth daily., Disp: , Rfl:  .  vitamin C (ASCORBIC ACID) 250 MG tablet, Take 250 mg by mouth daily. Not sure of dose   2 gummies a day, Disp: , Rfl:   Past Medical History: Past Medical History:  Diagnosis Date  . Anemia   . Cardiovascular disease   . Diabetes mellitus ([place])    Type II  .  Hypertension   . Neuropathy due to secondary diabetes mellitus ([place])   . Osteoarthritis     Tobacco Use: Social History   Tobacco Use  Smoking Status Former Smoker  Smokeless Tobacco Never Used    Labs: Recent Review [facility] Cardiac and Pulmonary Rehab Latest Ref Rng & Units 03/30/2013   Cholestrol 0 - 200 mg/dL 124   LDLCALC 0 - 100 mg/dL 60   HDL 40 - 60 mg/dL 41   Trlycerides 0 - 200 mg/dL 115       Exercise Target Goals: Exercise Program Goal: Individual exercise prescription set using results from initial 6 min walk test and THRR while considering  patient's activity barriers and safety.   Exercise Prescription Goal: Initial exercise prescription builds to 30-45 minutes a day of aerobic activity, 2-3 days per week.  Home exercise guidelines will be  given to patient during program as part of exercise prescription that the participant will acknowledge.  Activity Barriers & Risk Stratification: Activity Barriers & Cardiac Risk Stratification - 05/07/19 1650      Activity Barriers & Cardiac Risk Stratification   Activity Barriers  Deconditioning;Muscular Weakness;Balance Concerns;History of Falls;Shortness of Breath;Assistive Device    Comments  Both legs rebuilt from knees down  cannot use the treadmill    Cardiac Risk Stratification  High       6 Minute Walk: 6 Minute Walk    Row Name 05/07/19 1645         6 Minute Walk   Phase  Initial     Distance  1320 feet     Walk Time  6 minutes     # of Rest Breaks  0     MPH  2.5     METS  3.24     RPE  12     Perceived Dyspnea   1     VO2 Peak  11.34     Symptoms  Yes (comment)     Comments  fatigue and foot drag     Resting HR  61 bpm     Resting BP  146/74     Resting Oxygen Saturation   98 %     Exercise Oxygen Saturation  during 6 min walk  96 %     Max Ex. HR  107 bpm     Max Ex. BP  154/76     2 Minute Post BP  134/62        Oxygen Initial Assessment:   Oxygen Re-Evaluation:   Oxygen Discharge (Final Oxygen Re-Evaluation):   Initial Exercise Prescription: Initial Exercise Prescription - 05/07/19 1600      Date of Initial Exercise RX and Referring Provider   Date  05/07/19    Referring Provider  Isaias Cowman MD      Recumbant Bike   Level  3    RPM  50    Watts  41    Minutes  15    METs  3      NuStep   Level  2    SPM  80    Minutes  15    METs  2.5      Arm Ergometer   Level  3    Watts  58    RPM  30    Minutes  15    METs  2.5      Recumbant Elliptical   Level  1    RPM  50    Minutes  15    METs  2.5      T5 Nustep   Level  2    SPM  80    Minutes  15    METs  2.5      Prescription Details   Frequency (times per week)  2    Duration  Progress to 30 minutes of continuous aerobic without signs/symptoms of physical  distress      Intensity   THRR 40-80% of Max Heartrate  99-136    Ratings of Perceived Exertion  11-13    Perceived Dyspnea  0-4      Progression   Progression  Continue to progress workloads to maintain intensity without signs/symptoms of physical distress.      Resistance Training   Training Prescription  Yes    Weight  3 lbs    Reps  10-15       Perform Capillary Blood Glucose checks as needed.  Exercise Prescription Changes: Exercise Prescription Changes    Row Name 05/21/19 1000 05/27/19 1400 06/03/19 1100 06/20/19 1500 06/26/19 1500     Response to Exercise   Blood Pressure (Admit)  138/82  -  126/70  122/74  124/70   Blood Pressure (Exercise)  142/80  -  140/76  140/76  150/80   Blood Pressure (Exit)  122/56  -  120/68  132/78  130/76   Heart Rate (Admit)  63 bpm  -  84 bpm  65 bpm  70 bpm   Heart Rate (Exercise)  93 bpm  -  94 bpm  103 bpm  92 bpm   Heart Rate (Exit)  80 bpm  -  76 bpm  73 bpm  83 bpm   Rating of Perceived Exertion (Exercise)  12  -  _0 Symptoms  none  -  none  none  none   Duration  Continue with 30 min of aerobic exercise without signs/symptoms of physical distress.  -  Continue with 30 min of aerobic exercise without signs/symptoms of physical distress.  Continue with 30 min of aerobic exercise without signs/symptoms of physical distress.  Continue with 30 min of aerobic exercise without signs/symptoms of physical distress.   Intensity  THRR unchanged  -  THRR unchanged  THRR unchanged  THRR unchanged     Progression   Progression  Continue to progress workloads to maintain intensity without signs/symptoms of physical distress.  -  Continue to progress workloads to maintain intensity without signs/symptoms of physical distress.  Continue to progress workloads to maintain intensity without signs/symptoms of physical distress.  Continue to progress workloads to maintain intensity without signs/symptoms of physical distress.   Average METs  3.37   -  3.4  3.37  3.25     Resistance Training   Training Prescription  Yes  -  Yes  Yes  Yes   Weight  4 lbs  -  4 lbs  4 lbs  5 lbs   Reps  10-15  -  10-15  10-15  10-15     Interval Training   Interval Training  No  -  No  No  No     NuStep   Level  -  -  -  2  -   Minutes  -  -  -  15  -   METs  -  -  -  4  -     Arm Ergometer  Level  3  -  3  -  3   Minutes  15  -  15  -  15   METs  2.3  -  2.5  -  2.7     Recumbant Elliptical   Level  -  -  -  2  -   Minutes  -  -  -  15  -   METs  -  -  -  1.8  -     REL-XR   Level  2  -  _0 Minutes  15  -  _1 METs  2.8  -  4.9  5  4.8     T5 Nustep   Level  2  -  _2 Minutes  15  -  _3 METs  2  -  2.2  2.3  2.5     Biostep-RELP   Level  3  -  4  -  4   Minutes  15  -  15  -  15   METs  3  -  4  -  3     Home Exercise Plan   Plans to continue exercise at  -  Home (comment) walking  Home (comment) walking  Home (comment) walking  Home (comment) walking   Frequency  -  Add 3 additional days to program exercise sessions.  Add 3 additional days to program exercise sessions.  Add 3 additional days to program exercise sessions.  Add 3 additional days to program exercise sessions.   Initial Home Exercises Provided  -  05/27/19  05/27/19  05/27/19  05/27/19   Row Name 07/09/19 1400             Response to Exercise   Blood Pressure (Admit)  112/70       Blood Pressure (Exercise)  128/80       Blood Pressure (Exit)  122/68       Heart Rate (Admit)  82 bpm       Heart Rate (Exercise)  102 bpm       Heart Rate (Exit)  67 bpm       Rating of Perceived Exertion (Exercise)  13       Symptoms  none       Duration  Continue with 30 min of aerobic exercise without signs/symptoms of physical distress.       Intensity  THRR unchanged         Progression   Progression  Continue to progress workloads to maintain intensity without signs/symptoms of physical distress.       Average METs  3.6          Resistance Training   Training Prescription  Yes       Weight  5 lbs       Reps  10-15         Interval Training   Interval Training  No         Arm Ergometer   Level  3       Minutes  15       METs  2.6         REL-XR   Level  6       Minutes  15       METs  5.5  T5 Nustep   Level  4       Minutes  15       METs  2.3         Biostep-RELP   Level  4       Minutes  15       METs  4         Home Exercise Plan   Plans to continue exercise at  Home (comment) walking       Frequency  Add 3 additional days to program exercise sessions.       Initial Home Exercises Provided  05/27/19          Exercise Comments: Exercise Comments    Row Name 05/13/19 1455 07/01/19 1358         Exercise Comments  First full day of exercise!  Patient was oriented to gym and equipment including functions, settings, policies, and procedures.  Patient's individual exercise prescription and treatment plan were reviewed.  All starting workloads were established based on the results of the 6 minute walk test done at initial orientation visit.  The plan for exercise progression was also introduced and progression will be customized based on patient's performance and goals  Mr. Taormina brought a MD clearance letter post procedure stating he can exercise.         Exercise Goals and Review: Exercise Goals    Row Name 05/07/19 1655             Exercise Goals   Increase Physical Activity  Yes       Intervention  Provide advice, education, support and counseling about physical activity/exercise needs.;Develop an individualized exercise prescription for aerobic and resistive training based on initial evaluation findings, risk stratification, comorbidities and participant's personal goals.       Expected Outcomes  Short Term: Attend rehab on a regular basis to increase amount of physical activity.;Long Term: Add in home exercise to make exercise part of routine and to increase amount of physical  activity.;Long Term: Exercising regularly at least 3-5 days a week.       Increase Strength and Stamina  Yes       Intervention  Provide advice, education, support and counseling about physical activity/exercise needs.;Develop an individualized exercise prescription for aerobic and resistive training based on initial evaluation findings, risk stratification, comorbidities and participant's personal goals.       Expected Outcomes  Short Term: Increase workloads from initial exercise prescription for resistance, speed, and METs.;Short Term: Perform resistance training exercises routinely during rehab and add in resistance training at home;Long Term: Improve cardiorespiratory fitness, muscular endurance and strength as measured by increased METs and functional capacity (6MWT)       Able to understand and use rate of perceived exertion (RPE) scale  Yes       Intervention  Provide education and explanation on how to use RPE scale       Expected Outcomes  Short Term: Able to use RPE daily in rehab to express subjective intensity level;Long Term:  Able to use RPE to guide intensity level when exercising independently       Able to understand and use Dyspnea scale  Yes       Intervention  Provide education and explanation on how to use Dyspnea scale       Expected Outcomes  Short Term: Able to use Dyspnea scale daily in rehab to express subjective sense of shortness of breath during exertion;Long Term: Able to use  Dyspnea scale to guide intensity level when exercising independently       Knowledge and understanding of Target Heart Rate Range (THRR)  Yes       Intervention  Provide education and explanation of THRR including how the numbers were predicted and where they are located for reference       Expected Outcomes  Short Term: Able to use daily as guideline for intensity in rehab;Short Term: Able to state/look up THRR;Long Term: Able to use THRR to govern intensity when exercising independently       Able  to check pulse independently  Yes       Intervention  Review the importance of being able to check your own pulse for safety during independent exercise;Provide education and demonstration on how to check pulse in carotid and radial arteries.       Expected Outcomes  Short Term: Able to explain why pulse checking is important during independent exercise;Long Term: Able to check pulse independently and accurately       Understanding of Exercise Prescription  Yes       Intervention  Provide education, explanation, and written materials on patient's individual exercise prescription       Expected Outcomes  Short Term: Able to explain program exercise prescription;Long Term: Able to explain home exercise prescription to exercise independently          Exercise Goals Re-Evaluation : Exercise Goals Re-Evaluation    Row Name 05/13/19 1456 05/21/19 0918 05/27/19 1410 06/03/19 1152 06/17/19 1406     Exercise Goal Re-Evaluation   Exercise Goals Review  Able to understand and use rate of perceived exertion (RPE) scale;Knowledge and understanding of Target Heart Rate Range (THRR);Understanding of Exercise Prescription  Increase Strength and Stamina;Increase Physical Activity;Understanding of Exercise Prescription  Increase Strength and Stamina;Increase Physical Activity;Understanding of Exercise Prescription  Increase Physical Activity;Increase Strength and Stamina;Understanding of Exercise Prescription  Increase Physical Activity;Increase Strength and Stamina;Understanding of Exercise Prescription   Comments  Reviewed RPE scale, THR and program prescription with pt today.  Pt voiced understanding and was given a copy of goals to take home.  [name] is off to a good start in rehab. He has completed three full days of exercise.  He is already up to 2.8 METs on the XR and getting into the routine.  We will continue to montior his progression.  [name] is doing well in rehab.  He is starting to feel better and is staying  active at home. Reviewed home exercise with pt today.  Pt plans to walking at home for exercise.  Reviewed THR, pulse, RPE, sign and symptoms, NTG use, and when to call 911 or MD.  Also discussed weather considerations and indoor options.  Pt voiced understanding.  [name] is off to a good start in rehab.  He is now able to complete all 30 min each day without stopping.  He is up to 4.9 METs on the XR.  We will continue to monitor his progress.  [name] is doing well at home. He is walking at home every day either 2 or 3 times a day.  He usually averages about 30 min or 15 min is the rain is better.  He is doing well with his stamina. but his strength is not where he wants it to be and would to increase.  He is coming off his steriords to prepare for his next dose in the spine.   Expected Outcomes  Short: Use RPE daily to regulate intensity.  Long: Follow program prescription in THR.  Short: Continue to attend regularly.  Long: Continue follow program prescription.  Short: Continue to walk daily.  Long: Continue to increase strength.  Short: Continue to attend regularly.  Long: Continue to increase stamina.  Short: Continue to walk daily.  Long: Continue to increase stamina.   [place] Name 06/26/19 1541 07/09/19 1439           Exercise Goal Re-Evaluation   Exercise Goals Review  Increase Physical Activity;Increase Strength and Stamina;Understanding of Exercise Prescription  Increase Physical Activity;Increase Strength and Stamina;Understanding of Exercise Prescription      Comments  [name] had a spinal injection this week and hopes it will help with his back pain.  [name] was able to return to exercise last week.  He was able to increase both the XR and T5 NuStep last week!!  We will continue to monitor his progress.      Expected Outcomes  Short: Return to rehab. Long: Continue to walk on off days.  Short: Continue to increase Long: Walk more on off days!         Discharge Exercise Prescription (Final Exercise  Prescription Changes): Exercise Prescription Changes - 07/09/19 1400      Response to Exercise   Blood Pressure (Admit)  112/70    Blood Pressure (Exercise)  128/80    Blood Pressure (Exit)  122/68    Heart Rate (Admit)  82 bpm    Heart Rate (Exercise)  102 bpm    Heart Rate (Exit)  67 bpm    Rating of Perceived Exertion (Exercise)  13    Symptoms  none    Duration  Continue with 30 min of aerobic exercise without signs/symptoms of physical distress.    Intensity  THRR unchanged      Progression   Progression  Continue to progress workloads to maintain intensity without signs/symptoms of physical distress.    Average METs  3.6      Resistance Training   Training Prescription  Yes    Weight  5 lbs    Reps  10-15      Interval Training   Interval Training  No      Arm Ergometer   Level  3    Minutes  15    METs  2.6      REL-XR   Level  6    Minutes  15    METs  5.5      T5 Nustep   Level  4    Minutes  15    METs  2.3      Biostep-RELP   Level  4    Minutes  15    METs  4      Home Exercise Plan   Plans to continue exercise at  Home (comment)   walking   Frequency  Add 3 additional days to program exercise sessions.    Initial Home Exercises Provided  05/27/19       Nutrition:  Target Goals: Understanding of nutrition guidelines, daily intake of sodium <1581m, cholesterol <2064m calories 30% from fat and 7% or less from saturated fats, daily to have 5 or more servings of fruits and vegetables.  Biometrics: Pre Biometrics - 05/07/19 1656      Pre Biometrics   Height  5' 11.25" (1.81 m)    Weight  230 lb (104.3 kg)    BMI (Calculated)  31.84        Nutrition Therapy Plan  and Nutrition Goals: Nutrition Therapy & Goals - 05/07/19 1639      Nutrition Therapy   Diet  Low Na, HH, diabetic diet    Protein (specify units)  85g    Fiber  25 grams    Whole Grain Foods  3 servings    Saturated Fats  12 max. grams    Fruits and Vegetables  5  servings/day    Sodium  1.5 grams      Personal Nutrition Goals   Nutrition Goal  ST: stop eating take out burgers (talked about switching to chicken at [employer]) LT: increase stamina    Comments  Pt reports eating fast food for almost every meal due to wife not wanting healthy meals. Pt reports loving green leafy vegetables and fruit. Pt will make homemade sorbet at night. Discussed HH eating, MyPlate, low Na, healthy snacking, BG control, and suggested meal prepping or cooking foods and eating something different from his wife. Pt reports eating fruit at work (catelope and watermelon). Pt does not have teeth but can eat somethings well. possible goal to eat 2 snacks during the day to help maintain BG. Pt reports that his doctor said he isn't needed enough- BG may be low. On metformin. pt does his best to choose healthy options when eating out such as vegetables, taking skin of chicken, etc.      Intervention Plan   Intervention  Prescribe, educate and counsel regarding individualized specific dietary modifications aiming towards targeted core components such as weight, hypertension, lipid management, diabetes, heart failure and other comorbidities.;Nutrition handout(s) given to patient.    Expected Outcomes  Short Term Goal: Understand basic principles of dietary content, such as calories, fat, sodium, cholesterol and nutrients.;Short Term Goal: A plan has been developed with personal nutrition goals set during dietitian appointment.;Long Term Goal: Adherence to prescribed nutrition plan.       Nutrition Assessments:   Nutrition Goals Re-Evaluation: Nutrition Goals Re-Evaluation    Row Name 05/15/19 [place] 06/10/19 1416 07/03/19 1413         Goals   Nutrition Goal  ST: stop eating take out burgers (talked about switching to chicken at [employer]) LT: increase stamina  ST: try best when eating out to choose healthy options LT: increase stamina  ST: try best when eating out to choose healthy  options, monitor energy levels LT: increase stamina     Comment  Talked to pt again about whole grains and fiber and BG, reiterated that pt DR may say that he isn't eating enough due to Bg being low at times, talked about eating consistenly and good food choices to maintain BG. Pt reports still eating the best he can; eating lots of vegetables choosing chicken without skin, but will have white rice as he doesn't like brown rice.  Talked to pt again about whole grains and fiber and BG, as well as general healthy choices; discussed on the go healthy options, pt was not interested and insists he will continue with take out for lunch especially since his wife is very picky. Pt reports still eating the best he can when he cooks; eating lots of vegetables choosing chicken without skin, but will have white rice as he doesn't like brown rice.  Talked to pt again about whole grains and fiber and BG, as well as general healthy choices;  Pt reports still eating the best he can when he cooks; eating lots of vegetables choosing chicken without skin, but will have white rice as he  doesn't like brown rice. Pt reports energy levels down discussed dietary changes he could make to help with that such as adding protein or fat to B (cheerios). Pt reports he thinks his mediction, but is also not sleeping due to being a light sleeper. Discussed sleep hygiene techniques.     Expected Outcome  switch to chicken instead of burgers when getting takeout LT: increase stamina, maintain BG  ST: try best when eating out to choose healthy options LT: increase stamina  ST: try best when eating out to choose healthy options, monitor energy levels LT: increase stamina        Nutrition Goals Discharge (Final Nutrition Goals Re-Evaluation): Nutrition Goals Re-Evaluation - 07/03/19 1413      Goals   Nutrition Goal  ST: try best when eating out to choose healthy options, monitor energy levels LT: increase stamina    Comment  Talked to pt again  about whole grains and fiber and BG, as well as general healthy choices;  Pt reports still eating the best he can when he cooks; eating lots of vegetables choosing chicken without skin, but will have white rice as he doesn't like brown rice. Pt reports energy levels down discussed dietary changes he could make to help with that such as adding protein or fat to B (cheerios). Pt reports he thinks his mediction, but is also not sleeping due to being a light sleeper. Discussed sleep hygiene techniques.    Expected Outcome  ST: try best when eating out to choose healthy options, monitor energy levels LT: increase stamina       Psychosocial: Target Goals: Acknowledge presence or absence of significant depression and/or stress, maximize coping skills, provide positive support system. Participant is able to verbalize types and ability to use techniques and skills needed for reducing stress and depression.   Initial Review & Psychosocial Screening:   Quality of Life Scores:   Scores of 19 and below usually indicate a poorer quality of life in these areas.  A difference of  2-3 points is a clinically meaningful difference.  A difference of 2-3 points in the total score of the Quality of Life Index has been associated with significant improvement in overall quality of life, self-image, physical symptoms, and general health in studies assessing change in quality of life.  PHQ-9: Recent Review Flowsheet Data    There is no flowsheet data to display.     Interpretation of Total Score  Total Score Depression Severity:  1-4 = Minimal depression, 5-9 = Mild depression, 10-14 = Moderate depression, 15-19 = Moderately severe depression, 20-27 = Severe depression   Psychosocial Evaluation and Intervention:   Psychosocial Re-Evaluation: Psychosocial Re-Evaluation    [place] Name 05/27/19 1415 06/17/19 1408           Psychosocial Re-Evaluation   Current issues with  Current Stress Concerns  Current Stress  Concerns      Comments  [name] is doing well in rehab.  He walks with his dog daily.  He does not let stress get to him and stays positive overall.  He sleeps good and is compliant with his CPAP.  [name] has been doing well in rehab.  He continues to walk daily.  Overall, he is staying positive and stress is bearable. 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  He wants to retire but afraid to do it due to finances.  Still sleeps good and compliant with CPAP.  He uses his mask regulalry and they make  him frustrated.      Expected Outcomes  Short: Continue to practice self care.  Long: Continue to stay positive.  Short: Continue to walk and self care to stay postive.  Long: Continue to consider retirement.      Interventions  Encouraged to attend Cardiac Rehabilitation for the exercise  Encouraged to attend Cardiac Rehabilitation for the exercise      Continue Psychosocial Services   Follow up required by staff  Follow up required by staff         Psychosocial Discharge (Final Psychosocial Re-Evaluation): Psychosocial Re-Evaluation - 06/17/19 1408      Psychosocial Re-Evaluation   Current issues with  Current Stress Concerns    Comments  [name] has been doing well in rehab.  He continues to walk daily.  Overall, he is staying positive and stress is bearable. 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  He wants to retire but afraid to do it due to finances.  Still sleeps good and compliant with CPAP.  He uses his mask regulalry and they make him frustrated.    Expected Outcomes  Short: Continue to walk and self care to stay postive.  Long: Continue to consider retirement.    Interventions  Encouraged to attend Cardiac Rehabilitation for the exercise    Continue Psychosocial Services   Follow up required by staff       Vocational Rehabilitation: Provide vocational rehab assistance to qualifying candidates.   Vocational Rehab Evaluation & Intervention: Vocational Rehab -  04/29/19 1434      Initial Vocational Rehab Evaluation & Intervention   Assessment shows need for Vocational Rehabilitation  No       Education: Education Goals: Education classes will be provided on a variety of topics geared toward better understanding of heart health and risk factor modification. Participant will state understanding/return demonstration of topics presented as noted by education test scores.  Learning Barriers/Preferences:   Education Topics:  AED/CPR: - Group verbal and written instruction with the use of models to demonstrate the basic use of the AED with the basic ABC's of resuscitation.   General Nutrition Guidelines/Fats and Fiber: -Group instruction provided by verbal, written material, models and posters to present the general guidelines for heart healthy nutrition. Gives an explanation and review of dietary fats and fiber.   Controlling Sodium/Reading Food Labels: -Group verbal and written material supporting the discussion of sodium use in heart healthy nutrition. Review and explanation with models, verbal and written materials for utilization of the food label.   Exercise Physiology & General Exercise Guidelines: - Group verbal and written instruction with models to review the exercise physiology of the cardiovascular system and associated critical values. Provides general exercise guidelines with specific guidelines to those with heart or lung disease.    Aerobic Exercise & Resistance Training: - Gives group verbal and written instruction on the various components of exercise. Focuses on aerobic and resistive training programs and the benefits of this training and how to safely progress through these programs..   Flexibility, Balance, Mind/Body Relaxation: Provides group verbal/written instruction on the benefits of flexibility and balance training, including mind/body exercise modes such as yoga, pilates and tai chi.  Demonstration and skill practice  provided.   Stress and Anxiety: - Provides group verbal and written instruction about the health risks of elevated stress and causes of high stress.  Discuss the correlation between heart/lung disease and anxiety and treatment options. Review healthy ways to manage with stress and anxiety.  Depression: - Provides group verbal and written instruction on the correlation between heart/lung disease and depressed mood, treatment options, and the stigmas associated with seeking treatment.   Anatomy & Physiology of the Heart: - Group verbal and written instruction and models provide basic cardiac anatomy and physiology, with the coronary electrical and arterial systems. Review of Valvular disease and Heart Failure   Cardiac Procedures: - Group verbal and written instruction to review commonly prescribed medications for heart disease. Reviews the medication, class of the drug, and side effects. Includes the steps to properly store meds and maintain the prescription regimen. (beta blockers and nitrates)   Cardiac Medications I: - Group verbal and written instruction to review commonly prescribed medications for heart disease. Reviews the medication, class of the drug, and side effects. Includes the steps to properly store meds and maintain the prescription regimen.   Cardiac Medications II: -Group verbal and written instruction to review commonly prescribed medications for heart disease. Reviews the medication, class of the drug, and side effects. (all other drug classes)    Go Sex-Intimacy & Heart Disease, Get SMART - Goal Setting: - Group verbal and written instruction through game format to discuss heart disease and the return to sexual intimacy. Provides group verbal and written material to discuss and apply goal setting through the application of the S.M.A.R.T. Method.   Other Matters of the Heart: - Provides group verbal, written materials and models to describe Stable Angina and  Peripheral Artery. Includes description of the disease process and treatment options available to the cardiac patient.   Exercise & Equipment Safety: - Individual verbal instruction and demonstration of equipment use and safety with use of the equipment.   Cardiac Rehab from 05/07/2019 in [facility] Cardiac and Pulmonary Rehab  Date  05/07/19  Educator  [facility]  Instruction Review Code  1- Verbalizes Understanding      Infection Prevention: - Provides verbal and written material to individual with discussion of infection control including proper hand washing and proper equipment cleaning during exercise session.   Cardiac Rehab from 05/07/2019 in [facility] Cardiac and Pulmonary Rehab  Date  05/07/19  Educator  [facility]  Instruction Review Code  1- Verbalizes Understanding      Falls Prevention: - Provides verbal and written material to individual with discussion of falls prevention and safety.   Cardiac Rehab from 05/07/2019 in [facility] Cardiac and Pulmonary Rehab  Date  05/07/19  Educator  [facility]  Instruction Review Code  1- Verbalizes Understanding      Diabetes: - Individual verbal and written instruction to review signs/symptoms of diabetes, desired ranges of glucose level fasting, after meals and with exercise. Acknowledge that pre and post exercise glucose checks will be done for 3 sessions at entry of program.   Cardiac Rehab from 05/07/2019 in [facility] Cardiac and Pulmonary Rehab  Date  05/07/19  Educator  [facility]  Instruction Review Code  1- Verbalizes Understanding      Know Your Numbers and Risk Factors: -Group verbal and written instruction about important numbers in your health.  Discussion of what are risk factors and how they play a role in the disease process.  Review of Cholesterol, Blood Pressure, Diabetes, and BMI and the role they play in your overall health.   Sleep Hygiene: -Provides group verbal and written instruction about how sleep can affect your health.  Define sleep hygiene, discuss sleep  cycles and impact of sleep habits. Review good sleep hygiene tips.    Other: -Provides group and verbal  instruction on various topics (see comments)   Knowledge Questionnaire Score:   Core Components/Risk Factors/Patient Goals at Admission: Personal Goals and Risk Factors at Admission - 05/07/19 1656      Core Components/Risk Factors/Patient Goals on Admission    Weight Management  Yes;Obesity;Weight Loss    Intervention  Weight Management: Develop a combined nutrition and exercise program designed to reach desired caloric intake, while maintaining appropriate intake of nutrient and fiber, sodium and fats, and appropriate energy expenditure required for the weight goal.;Weight Management: Provide education and appropriate resources to help participant work on and attain dietary goals.;Obesity: Provide education and appropriate resources to help participant work on and attain dietary goals.;Weight Management/Obesity: Establish reasonable short term and long term weight goals.    Admit Weight  230 lb (104.3 kg)    Goal Weight: Short Term  225 lb (102.1 kg)    Goal Weight: Long Term  220 lb (99.8 kg)    Expected Outcomes  Short Term: Continue to assess and modify interventions until short term weight is achieved;Long Term: Adherence to nutrition and physical activity/exercise program aimed toward attainment of established weight goal;Weight Loss: Understanding of general recommendations for a balanced deficit meal plan, which promotes 1-2 lb weight loss per week and includes a negative energy balance of [phone] kcal/d;Understanding recommendations for meals to include 15-35% energy as protein, 25-35% energy from fat, 35-60% energy from carbohydrates, less than 245m of dietary cholesterol, 20-35 gm of total fiber daily;Understanding of distribution of calorie intake throughout the day with the consumption of 4-5 meals/snacks    Diabetes  Yes    Intervention  Provide education about signs/symptoms  and action to take for hypo/hyperglycemia.;Provide education about proper nutrition, including hydration, and aerobic/resistive exercise prescription along with prescribed medications to achieve blood glucose in normal ranges: Fasting glucose 65-99 mg/dL    Expected Outcomes  Short Term: Participant verbalizes understanding of the signs/symptoms and immediate care of hyper/hypoglycemia, proper foot care and importance of medication, aerobic/resistive exercise and nutrition plan for blood glucose control.;Long Term: Attainment of HbA1C < 7%.    Hypertension  Yes    Intervention  Provide education on lifestyle modifcations including regular physical activity/exercise, weight management, moderate sodium restriction and increased consumption of fresh fruit, vegetables, and low fat dairy, alcohol moderation, and smoking cessation.;Monitor prescription use compliance.    Expected Outcomes  Short Term: Continued assessment and intervention until BP is < 140/916mHG in hypertensive participants. < 130/8010mG in hypertensive participants with diabetes, heart failure or chronic kidney disease.;Long Term: Maintenance of blood pressure at goal levels.    Lipids  Yes    Intervention  Provide education and support for participant on nutrition & aerobic/resistive exercise along with prescribed medications to achieve LDL <69m23mDL >40mg73m Expected Outcomes  Short Term: Participant states understanding of desired cholesterol values and is compliant with medications prescribed. Participant is following exercise prescription and nutrition guidelines.;Long Term: Cholesterol controlled with medications as prescribed, with individualized exercise RX and with personalized nutrition plan. Value goals: LDL < 69mg,15m > 40 mg.       Core Components/Risk Factors/Patient Goals Review:  Goals and Risk Factor Review    Row Name 05/27/19 1417 06/17/19 1411           Core Components/Risk Factors/Patient Goals Review    Personal Goals Review  Weight Management/Obesity;Hypertension;Diabetes  Weight Management/Obesity;Hypertension;Diabetes      Review  Mike iRonalee Beltsing well at home.  His weight stays between 215-218lbs.  Overall, his blood sugars have been good and he has not noticed any lows in his system.  Blood pressures have been good and he checks them daily at home in the morning along with his sugars. He is doing well on his medications.  [name] continues to do well.  His weight is 227-230 lbs recently and seems to be related to his fluid levels which flucuate greatly.  He is doing his best to stay on top of it, he also drinks a lot of water.  Sugars have been good and stay around 110 mg/Dl on average.  Today he was 93 fasting.   His blood pressures have been in the 120s-130s/70s.  He has been watching his sodium to help with control.  He has also uses our YouTube channel for exercise and education.      Expected Outcomes  Short: Continue to work on weight loss.  Long: Continue to manage diabetes.  Short: Continue to work on weight loss.  Long: Continue to manage diabetes.         Core Components/Risk Factors/Patient Goals at Discharge (Final Review):  Goals and Risk Factor Review - 06/17/19 1411      Core Components/Risk Factors/Patient Goals Review   Personal Goals Review  Weight Management/Obesity;Hypertension;Diabetes    Review  [name] continues to do well.  His weight is 227-230 lbs recently and seems to be related to his fluid levels which flucuate greatly.  He is doing his best to stay on top of it, he also drinks a lot of water.  Sugars have been good and stay around 110 mg/Dl on average.  Today he was 93 fasting.   His blood pressures have been in the 120s-130s/70s.  He has been watching his sodium to help with control.  He has also uses our YouTube channel for exercise and education.    Expected Outcomes  Short: Continue to work on weight loss.  Long: Continue to manage diabetes.       ITP Comments: ITP  Comments    Row Name 04/29/19 1432 05/07/19 1634 05/15/19 1412 06/12/19 0627 06/19/19 1410   ITP Comments  Initial Virtual Vist completed today   Obtained consent reviewd Risk factors and set appt for EP and RD Followup.  Completed 6MWT and nutrition evaluations today.  Documentation for diagnosis can be found in [facility] encounter 04/01/2019.  Initial ITP created and sent for review to Dr. Emily Filbert, Medical Director.  30 day review cycle restarting  after being closed since March 16 because of  Covid 19 pandemic. Program opened to patients on July 6. Not all have returned. ITP updated and sent to Medical Director for review,changes as needed and signature  30 Day Review Completed today. Continue with ITP unless changed by Medical Director review.  [name] says he will not be here Monday as he has spinal injections that day.  WIll need to see when he can exercise again.   [place] Name 06/26/19 1541 07/10/19 0638         ITP Comments  Spinal injection on 8/24 out for week.  30 Day review. Continue with ITP unless directed changes per Medical Director review.         Comments:

## 2019-07-15 ENCOUNTER — Other Ambulatory Visit: Payer: Self-pay

## 2019-07-15 ENCOUNTER — Encounter: Payer: Medicare Other | Admitting: *Deleted

## 2019-07-15 DIAGNOSIS — I214 Non-ST elevation (NSTEMI) myocardial infarction: Secondary | ICD-10-CM | POA: Diagnosis not present

## 2019-07-15 DIAGNOSIS — Z955 Presence of coronary angioplasty implant and graft: Secondary | ICD-10-CM

## 2019-07-15 NOTE — Progress Notes (Signed)
Daily Session Note  Patient Details  Name: Kent [name] MRN: [id number] Date of Birth: [birth year] Referring Provider:     Cardiac Rehab from 05/07/2019 in [facility] Cardiac and Pulmonary Rehab  Referring Provider  Isaias Cowman MD      Encounter Date: 07/15/2019  Check In: Session Check In - 07/15/19 1401      Check-In   Supervising physician immediately available to respond to emergencies  See telemetry face sheet for immediately available ER MD    Location  [facility]-Cardiac & Pulmonary Rehab    Staff Present  Renita Papa, RN BSN;Jessica Luan Pulling, MA, RCEP, CCRP, CCET;Joseph Buda RCP,RRT,BSRT    Virtual Visit  No    Medication changes reported      No    Fall or balance concerns reported     No    Warm-up and Cool-down  Performed on first and last piece of equipment    Resistance Training Performed  Yes    VAD Patient?  No    PAD/SET Patient?  No      Pain Assessment   Currently in Pain?  No/denies          Social History   Tobacco Use  Smoking Status Former Smoker  Smokeless Tobacco Never Used    Goals Met:  Independence with exercise equipment Exercise tolerated well No report of cardiac concerns or symptoms Strength training completed today  Goals Unmet:  Not Applicable  Comments: Pt able to follow exercise prescription today without complaint.  Will continue to monitor for progression.    Dr. Emily Filbert is Medical Director for [place] and [facility].

## 2019-07-17 ENCOUNTER — Encounter: Payer: Medicare Other | Admitting: *Deleted

## 2019-07-17 ENCOUNTER — Other Ambulatory Visit: Payer: Self-pay

## 2019-07-17 DIAGNOSIS — I214 Non-ST elevation (NSTEMI) myocardial infarction: Secondary | ICD-10-CM | POA: Diagnosis not present

## 2019-07-17 DIAGNOSIS — Z955 Presence of coronary angioplasty implant and graft: Secondary | ICD-10-CM

## 2019-07-17 NOTE — Progress Notes (Signed)
Daily Session Note  Patient Details  Name: Kent [name] MRN: [id number] Date of Birth: 1956-06-02 Referring Provider:     Cardiac Rehab from 05/07/2019 in [facility] Cardiac and Pulmonary Rehab  Referring Provider  Isaias Cowman MD      Encounter Date: 07/17/2019  Check In: Session Check In - 07/17/19 1410      Check-In   Supervising physician immediately available to respond to emergencies  See telemetry face sheet for immediately available ER MD    Location  [facility]-Cardiac & Pulmonary Rehab    Staff Present  Renita Papa, RN Vickki Hearing, BA, ACSM CEP, Exercise Physiologist;Joseph Tessie Fass RCP,RRT,BSRT    Virtual Visit  No    Medication changes reported      No    Fall or balance concerns reported     No    Warm-up and Cool-down  Performed on first and last piece of equipment    Resistance Training Performed  Yes    VAD Patient?  No    PAD/SET Patient?  No      Pain Assessment   Currently in Pain?  No/denies          Social History   Tobacco Use  Smoking Status Former Smoker  Smokeless Tobacco Never Used    Goals Met:  Independence with exercise equipment Exercise tolerated well No report of cardiac concerns or symptoms Strength training completed today  Goals Unmet:  Not Applicable  Comments: Pt able to follow exercise prescription today without complaint.  Will continue to monitor for progression.    Dr. Emily Filbert is Medical Director for [place] and [facility].

## 2019-07-22 ENCOUNTER — Encounter: Payer: Medicare Other | Admitting: *Deleted

## 2019-07-22 ENCOUNTER — Other Ambulatory Visit: Payer: Self-pay

## 2019-07-22 DIAGNOSIS — Z955 Presence of coronary angioplasty implant and graft: Secondary | ICD-10-CM

## 2019-07-22 DIAGNOSIS — I214 Non-ST elevation (NSTEMI) myocardial infarction: Secondary | ICD-10-CM | POA: Diagnosis not present

## 2019-07-22 NOTE — Progress Notes (Signed)
Daily Session Note  Patient Details  Name: Myles Tavella MRN: [id number] Date of Birth: [birth year] Referring Provider:     Cardiac Rehab from 05/07/2019 in [facility] Cardiac and Pulmonary Rehab  Referring Provider  Isaias Cowman MD      Encounter Date: 07/22/2019  Check In: Session Check In - 07/22/19 1359      Check-In   Supervising physician immediately available to respond to emergencies  See telemetry face sheet for immediately available ER MD    Location  [facility]-Cardiac & Pulmonary Rehab    Staff Present  Renita Papa, RN Moises Blood, BS, ACSM CEP, Exercise Physiologist;Joseph Tessie Fass RCP,RRT,BSRT    Virtual Visit  No    Medication changes reported      No    Fall or balance concerns reported     No    Warm-up and Cool-down  Performed on first and last piece of equipment    Resistance Training Performed  Yes    VAD Patient?  No      Pain Assessment   Currently in Pain?  No/denies          Social History   Tobacco Use  Smoking Status Former Smoker  Smokeless Tobacco Never Used    Goals Met:  Independence with exercise equipment Exercise tolerated well No report of cardiac concerns or symptoms Strength training completed today  Goals Unmet:  Not Applicable  Comments: Pt able to follow exercise prescription today without complaint.  Will continue to monitor for progression.    Dr. Emily Filbert is Medical Director for [place] and [facility].

## 2019-07-24 ENCOUNTER — Other Ambulatory Visit: Payer: Self-pay

## 2019-07-24 ENCOUNTER — Encounter: Payer: Medicare Other | Admitting: *Deleted

## 2019-07-24 DIAGNOSIS — I214 Non-ST elevation (NSTEMI) myocardial infarction: Secondary | ICD-10-CM

## 2019-07-24 DIAGNOSIS — Z955 Presence of coronary angioplasty implant and graft: Secondary | ICD-10-CM

## 2019-07-24 NOTE — Progress Notes (Signed)
Daily Session Note  Patient Details  Name: Kent [name] MRN: [id number] Date of Birth: 03-Sep-1956 Referring Provider:     Cardiac Rehab from 05/07/2019 in [facility] Cardiac and Pulmonary Rehab  Referring Provider  Isaias Cowman MD      Encounter Date: 07/24/2019  Check In: Session Check In - 07/24/19 1400      Check-In   Supervising physician immediately available to respond to emergencies  See telemetry face sheet for immediately available ER MD    Location  [facility]-Cardiac & Pulmonary Rehab    Staff Present  Renita Papa, RN Vickki Hearing, BA, ACSM CEP, Exercise Physiologist;Joseph Tessie Fass RCP,RRT,BSRT    Virtual Visit  No    Medication changes reported      No    Fall or balance concerns reported     No    Warm-up and Cool-down  Performed on first and last piece of equipment    Resistance Training Performed  Yes    VAD Patient?  No    PAD/SET Patient?  No      Pain Assessment   Currently in Pain?  No/denies          Social History   Tobacco Use  Smoking Status Former Smoker  Smokeless Tobacco Never Used    Goals Met:  Independence with exercise equipment Exercise tolerated well No report of cardiac concerns or symptoms Strength training completed today  Goals Unmet:  Not Applicable  Comments: Pt able to follow exercise prescription today without complaint.  Will continue to monitor for progression.    Dr. Emily Filbert is Medical Director for [place] and [facility].

## 2019-07-29 ENCOUNTER — Encounter: Payer: Medicare Other | Admitting: *Deleted

## 2019-07-29 ENCOUNTER — Other Ambulatory Visit: Payer: Self-pay

## 2019-07-29 DIAGNOSIS — I214 Non-ST elevation (NSTEMI) myocardial infarction: Secondary | ICD-10-CM

## 2019-07-29 NOTE — Progress Notes (Signed)
Daily Session Note  Patient Details  Name: Kent [name] MRN: [id number] Date of Birth: 1956/06/19 Referring Provider:     Cardiac Rehab from 05/07/2019 in [facility] Cardiac and Pulmonary Rehab  Referring Provider  Isaias Cowman MD      Encounter Date: 07/29/2019  Check In: Session Check In - 07/29/19 1356      Check-In   Supervising physician immediately available to respond to emergencies  See telemetry face sheet for immediately available ER MD    Location  [facility]-Cardiac & Pulmonary Rehab    Staff Present  Earlean Shawl, BS, ACSM CEP, Exercise Physiologist;Justus Droke, RN, BSN-BC, CCRP;Joseph Hood RCP,RRT,BSRT;Jessica [place], [place], RCEP, CCRP, CCET    Virtual Visit  No    Medication changes reported      No    Fall or balance concerns reported     No    Tobacco Cessation  No Change    Warm-up and Cool-down  Performed on first and last piece of equipment    Resistance Training Performed  Yes    VAD Patient?  No    PAD/SET Patient?  No      Pain Assessment   Currently in Pain?  No/denies    Multiple Pain Sites  No          Social History   Tobacco Use  Smoking Status Former Smoker  Smokeless Tobacco Never Used    Goals Met:  Independence with exercise equipment Exercise tolerated well No report of cardiac concerns or symptoms Strength training completed today  Goals Unmet:  Not Applicable  Comments: Pt able to follow exercise prescription today without complaint.  Will continue to monitor for progression.    Dr. Emily Filbert is Medical Director for [place] and [facility].

## 2019-07-31 ENCOUNTER — Encounter: Payer: Medicare Other | Admitting: *Deleted

## 2019-07-31 ENCOUNTER — Other Ambulatory Visit: Payer: Self-pay

## 2019-07-31 DIAGNOSIS — Z955 Presence of coronary angioplasty implant and graft: Secondary | ICD-10-CM

## 2019-07-31 DIAGNOSIS — I214 Non-ST elevation (NSTEMI) myocardial infarction: Secondary | ICD-10-CM | POA: Diagnosis not present

## 2019-07-31 NOTE — Progress Notes (Signed)
Daily Session Note  Patient Details  Name: Kent [name] MRN: [id number] Date of Birth: 1956/09/03 Referring Provider:     Cardiac Rehab from 05/07/2019 in [facility]  Referring Provider  Isaias Cowman MD      Encounter Date: 07/31/2019  Check In: Session Check In - 07/31/19 1357      Check-In   Supervising physician immediately available to respond to emergencies  See telemetry face sheet for immediately available ER MD    Location  [facility]-Cardiac & Pulmonary Rehab    Staff Present  Renita Papa, RN BSN;Joseph Foy [place], [place], ACSM CEP, Exercise Physiologist    Virtual Visit  No    Medication changes reported      No    Fall or balance concerns reported     No    Warm-up and Cool-down  Performed on first and last piece of equipment    Resistance Training Performed  Yes    VAD Patient?  No    PAD/SET Patient?  No      Pain Assessment   Currently in Pain?  No/denies          Social History   Tobacco Use  Smoking Status Former Smoker  Smokeless Tobacco Never Used    Goals Met:  Independence with exercise equipment Exercise tolerated well No report of cardiac concerns or symptoms Strength training completed today  Goals Unmet:  Not Applicable  Comments: Pt able to follow exercise prescription today without complaint.  Will continue to monitor for progression.    Dr. Emily Filbert is Medical Director for [facility].

## 2019-08-05 ENCOUNTER — Encounter: Payer: Medicare Other | Attending: Cardiology

## 2019-08-05 DIAGNOSIS — Z7984 Long term (current) use of oral hypoglycemic drugs: Secondary | ICD-10-CM | POA: Insufficient documentation

## 2019-08-05 DIAGNOSIS — Z7902 Long term (current) use of antithrombotics/antiplatelets: Secondary | ICD-10-CM | POA: Insufficient documentation

## 2019-08-05 DIAGNOSIS — Z79899 Other long term (current) drug therapy: Secondary | ICD-10-CM | POA: Insufficient documentation

## 2019-08-05 DIAGNOSIS — I214 Non-ST elevation (NSTEMI) myocardial infarction: Secondary | ICD-10-CM | POA: Insufficient documentation

## 2019-08-05 DIAGNOSIS — I10 Essential (primary) hypertension: Secondary | ICD-10-CM | POA: Insufficient documentation

## 2019-08-05 DIAGNOSIS — Z7982 Long term (current) use of aspirin: Secondary | ICD-10-CM | POA: Insufficient documentation

## 2019-08-05 DIAGNOSIS — E1140 Type 2 diabetes mellitus with diabetic neuropathy, unspecified: Secondary | ICD-10-CM | POA: Insufficient documentation

## 2019-08-05 DIAGNOSIS — Z87891 Personal history of nicotine dependence: Secondary | ICD-10-CM | POA: Insufficient documentation

## 2019-08-05 DIAGNOSIS — Z955 Presence of coronary angioplasty implant and graft: Secondary | ICD-10-CM | POA: Insufficient documentation

## 2019-08-05 DIAGNOSIS — M1990 Unspecified osteoarthritis, unspecified site: Secondary | ICD-10-CM | POA: Insufficient documentation

## 2019-08-07 ENCOUNTER — Encounter: Payer: Self-pay | Admitting: *Deleted

## 2019-08-07 DIAGNOSIS — I214 Non-ST elevation (NSTEMI) myocardial infarction: Secondary | ICD-10-CM

## 2019-08-07 DIAGNOSIS — Z955 Presence of coronary angioplasty implant and graft: Secondary | ICD-10-CM

## 2019-08-07 NOTE — Progress Notes (Signed)
Cardiac Individual Treatment Plan  Patient Details  [name] MRN: [id number] Date of Birth: 01/20/1956 Referring Provider:     Cardiac Rehab from 05/07/2019 in [name]  Referring Provider  Kent [name]      Initial Encounter Date:    Cardiac Rehab from 05/07/2019 in [name]  Date  05/07/19      Visit Diagnosis: NSTEMI (non-ST elevation myocardial infarction) Kent [name])  Status post coronary artery stent placement  Patient's Home Medications on Admission:  Current Outpatient Medications:  .  ALPRAZolam (XANAX) 0.25 MG tablet, Take 0.125 mg by mouth 3 (three) times daily as needed for anxiety., Disp: , Rfl:  .  aspirin EC 81 MG tablet, Take 81 mg by mouth at bedtime., Disp: , Rfl:  .  atorvastatin (LIPITOR) 80 MG tablet, Take 1 tablet (80 mg total) by mouth daily., Disp: 30 tablet, Rfl: 11 .  carvedilol (COREG) 25 MG tablet, Take 25 mg by mouth 4 (four) times daily., Disp: , Rfl:  .  clopidogrel (PLAVIX) 75 MG tablet, Take 75 mg by mouth daily., Disp: , Rfl:  .  ferrous sulfate 325 (65 FE) MG tablet, Take 325 mg by mouth 2 (two) times daily with a meal., Disp: , Rfl:  .  gabapentin (NEURONTIN) 300 MG capsule, Take 300 mg by mouth See admin instructions. Take 300 mg by mouth at supper and take 300 mg by mouth at bedtime, Disp: , Rfl:  .  HYDROcodone-acetaminophen (NORCO) 7.5-325 MG tablet, Take 0.5 tablets by mouth 3 (three) times daily as needed for moderate pain., Disp: , Rfl:  .  ibuprofen (ADVIL,MOTRIN) 200 MG tablet, Take 200-400 mg by mouth See admin instructions. Take 400 mg by mouth in the morning, take 200 mg by mouth at lunch and take 200 mg by mouth at bedtime, Disp: , Rfl:  .  isosorbide mononitrate (IMDUR) 30 MG 24 hr tablet, Take 30 mg by mouth daily., Disp: , Rfl:  .  lisinopril (PRINIVIL,ZESTRIL) 20 MG tablet, Take 20 mg by mouth daily., Disp: , Rfl:  .  Magnesium 500 MG TABS, Take 500 mg by mouth  daily., Disp: , Rfl:  .  Melatonin 10 MG CAPS, Take 10 mg by mouth at bedtime., Disp: , Rfl:  .  metFORMIN (GLUCOPHAGE) 500 MG tablet, Take 500 mg by mouth 3 (three) times daily., Disp: , Rfl:  .  methocarbamol (ROBAXIN) 500 MG tablet, Take 500 mg by mouth at bedtime., Disp: , Rfl:  .  Multiple Vitamins-Minerals (CENTRUM ADULTS PO), Take 1 tablet by mouth daily., Disp: , Rfl:  .  nitroGLYCERIN (NITROSTAT) 0.4 MG SL tablet, Place 0.4 mg under the tongue every 5 (five) minutes as needed for chest pain., Disp: , Rfl:  .  nitroGLYCERIN (NITROSTAT) 0.4 MG SL tablet, Place 1 tablet (0.4 mg total) under the tongue every 5 (five) minutes as needed for chest pain., Disp: 100 tablet, Rfl: 3 .  omeprazole (PRILOSEC) 20 MG capsule, Take 20 mg by mouth daily., Disp: , Rfl:  .  PARoxetine (PAXIL) 20 MG tablet, Take 20 mg by mouth daily., Disp: , Rfl:  .  vitamin C (ASCORBIC ACID) 250 MG tablet, Take 250 mg by mouth daily. Not sure of dose   2 gummies a day, Disp: , Rfl:   Past Medical History: Past Medical History:  Diagnosis Date  . Anemia   . Cardiovascular disease   . Diabetes mellitus ([place])    Type II  .  Hypertension   . Neuropathy due to secondary diabetes mellitus ([place])   . Osteoarthritis     Tobacco Use: Social History   Tobacco Use  Smoking Status Former Smoker  Smokeless Tobacco Never Used    Labs: Recent Review [facility] Latest Ref Rng & Units 03/30/2013   Cholestrol 0 - 200 mg/dL 124   LDLCALC 0 - 100 mg/dL 60   HDL 40 - 60 mg/dL 41   Trlycerides 0 - 200 mg/dL 115       Exercise Target Goals: Exercise Program Goal: Individual exercise prescription set using results from initial 6 min walk test and THRR while considering  patient's activity barriers and safety.   Exercise Prescription Goal: Initial exercise prescription builds to 30-45 minutes a day of aerobic activity, 2-3 days per week.  Home exercise guidelines will be  given to patient during program as part of exercise prescription that the participant will acknowledge.  Activity Barriers & Risk Stratification: Activity Barriers & Cardiac Risk Stratification - 05/07/19 1650      Activity Barriers & Cardiac Risk Stratification   Activity Barriers  Deconditioning;Muscular Weakness;Balance Concerns;History of Falls;Shortness of Breath;Assistive Device    Comments  Both legs rebuilt from knees down  cannot use the treadmill    Cardiac Risk Stratification  High       6 Minute Walk: 6 Minute Walk    Row [name] 05/07/19 1645         6 Minute Walk   Phase  Initial     Distance  1320 feet     Walk Time  6 minutes     # of Rest Breaks  0     MPH  2.5     METS  3.24     RPE  12     Perceived Dyspnea   1     VO2 Peak  11.34     Symptoms  Yes (comment)     Comments  fatigue and foot drag     Resting HR  61 bpm     Resting BP  146/74     Resting Oxygen Saturation   98 %     Exercise Oxygen Saturation  during 6 min walk  96 %     Max Ex. HR  107 bpm     Max Ex. BP  154/76     2 Minute Post BP  134/62        Oxygen Initial Assessment:   Oxygen Re-Evaluation:   Oxygen Discharge (Final Oxygen Re-Evaluation):   Initial Exercise Prescription: Initial Exercise Prescription - 05/07/19 1600      Date of Initial Exercise RX and Referring Provider   Date  05/07/19    Referring Provider  Kent [name]      Recumbant Bike   Level  3    RPM  50    Watts  41    Minutes  15    METs  3      NuStep   Level  2    SPM  80    Minutes  15    METs  2.5      Arm Ergometer   Level  3    Watts  58    RPM  30    Minutes  15    METs  2.5      Recumbant Elliptical   Level  1    RPM  50    Minutes  15    METs  2.5      T5 Nustep   Level  2    SPM  80    Minutes  15    METs  2.5      Prescription Details   Frequency (times per week)  2    Duration  Progress to 30 minutes of continuous aerobic without signs/symptoms of physical  distress      Intensity   THRR 40-80% of Max Heartrate  99-136    Ratings of Perceived Exertion  11-13    Perceived Dyspnea  0-4      Progression   Progression  Continue to progress workloads to maintain intensity without signs/symptoms of physical distress.      Resistance Training   Training Prescription  Yes    Weight  3 lbs    Reps  10-15       Perform Capillary Blood Glucose checks as needed.  Exercise Prescription Changes: Exercise Prescription Changes    Row [name] 05/21/19 1000 05/27/19 1400 06/03/19 1100 06/20/19 1500 06/26/19 1500     Response to Exercise   Blood Pressure (Admit)  138/82  -  126/70  122/74  124/70   Blood Pressure (Exercise)  142/80  -  140/76  140/76  150/80   Blood Pressure (Exit)  122/56  -  120/68  132/78  130/76   Heart Rate (Admit)  63 bpm  -  84 bpm  65 bpm  70 bpm   Heart Rate (Exercise)  93 bpm  -  94 bpm  103 bpm  92 bpm   Heart Rate (Exit)  80 bpm  -  76 bpm  73 bpm  83 bpm   Rating of Perceived Exertion (Exercise)  12  -  _0 Symptoms  none  -  none  none  none   Duration  Continue with 30 min of aerobic exercise without signs/symptoms of physical distress.  -  Continue with 30 min of aerobic exercise without signs/symptoms of physical distress.  Continue with 30 min of aerobic exercise without signs/symptoms of physical distress.  Continue with 30 min of aerobic exercise without signs/symptoms of physical distress.   Intensity  THRR unchanged  -  THRR unchanged  THRR unchanged  THRR unchanged     Progression   Progression  Continue to progress workloads to maintain intensity without signs/symptoms of physical distress.  -  Continue to progress workloads to maintain intensity without signs/symptoms of physical distress.  Continue to progress workloads to maintain intensity without signs/symptoms of physical distress.  Continue to progress workloads to maintain intensity without signs/symptoms of physical distress.   Average METs  3.37   -  3.4  3.37  3.25     Resistance Training   Training Prescription  Yes  -  Yes  Yes  Yes   Weight  4 lbs  -  4 lbs  4 lbs  5 lbs   Reps  10-15  -  10-15  10-15  10-15     Interval Training   Interval Training  No  -  No  No  No     NuStep   Level  -  -  -  2  -   Minutes  -  -  -  15  -   METs  -  -  -  4  -     Arm Ergometer  Level  3  -  3  -  3   Minutes  15  -  15  -  15   METs  2.3  -  2.5  -  2.7     Recumbant Elliptical   Level  -  -  -  2  -   Minutes  -  -  -  15  -   METs  -  -  -  1.8  -     REL-XR   Level  2  -  _0 Minutes  15  -  _1 METs  2.8  -  4.9  5  4.8     T5 Nustep   Level  2  -  _2 Minutes  15  -  _3 METs  2  -  2.2  2.3  2.5     Biostep-RELP   Level  3  -  4  -  4   Minutes  15  -  15  -  15   METs  3  -  4  -  3     Home Exercise Plan   Plans to continue exercise at  -  Home (comment) walking  Home (comment) walking  Home (comment) walking  Home (comment) walking   Frequency  -  Add 3 additional days to program exercise sessions.  Add 3 additional days to program exercise sessions.  Add 3 additional days to program exercise sessions.  Add 3 additional days to program exercise sessions.   Initial Home Exercises Provided  -  05/27/19  05/27/19  05/27/19  05/27/19   Row [name] 07/09/19 1400 07/25/19 1400           Response to Exercise   Blood Pressure (Admit)  112/70  128/68      Blood Pressure (Exercise)  128/80  144/60      Blood Pressure (Exit)  122/68  120/66      Heart Rate (Admit)  82 bpm  62 bpm      Heart Rate (Exercise)  102 bpm  123 bpm      Heart Rate (Exit)  67 bpm  72 bpm      Rating of Perceived Exertion (Exercise)  13  14      Symptoms  none  none      Duration  Continue with 30 min of aerobic exercise without signs/symptoms of physical distress.  Continue with 30 min of aerobic exercise without signs/symptoms of physical distress.      Intensity  THRR unchanged  THRR unchanged         Progression   Progression  Continue to progress workloads to maintain intensity without signs/symptoms of physical distress.  Continue to progress workloads to maintain intensity without signs/symptoms of physical distress.      Average METs  3.6  3.6        Resistance Training   Training Prescription  Yes  Yes      Weight  5 lbs  5 lb      Reps  10-15  10-15        Interval Training   Interval Training  No  No        Arm Ergometer   Level  3  4      Minutes  15  15  METs  2.6  3.1        REL-XR   Level  6  6      Minutes  15  15      METs  5.5  5.5        T5 Nustep   Level  4  4      Minutes  15  15      METs  2.3  2.4        Biostep-RELP   Level  4  5      Minutes  15  15      METs  4  4        Home Exercise Plan   Plans to continue exercise at  Home (comment) walking  -      Frequency  Add 3 additional days to program exercise sessions.  -      Initial Home Exercises Provided  05/27/19  -         Exercise Comments: Exercise Comments    Row [name] 05/13/19 1455 07/01/19 1358         Exercise Comments  First full day of exercise!  Patient was oriented to gym and equipment including functions, settings, policies, and procedures.  Patient's individual exercise prescription and treatment plan were reviewed.  All starting workloads were established based on the results of the 6 minute walk test done at initial orientation visit.  The plan for exercise progression was also introduced and progression will be customized based on patient's performance and goals  Mr. Osorno brought a [name] clearance letter post procedure stating he can exercise.         Exercise Goals and Review: Exercise Goals    Row [name] 05/07/19 1655             Exercise Goals   Increase Physical Activity  Yes       Intervention  Provide advice, education, support and counseling about physical activity/exercise needs.;Develop an individualized exercise prescription for aerobic and resistive training  based on initial evaluation findings, risk stratification, comorbidities and participant's personal goals.       Expected Outcomes  Short Term: Attend rehab on a regular basis to increase amount of physical activity.;Long Term: Add in home exercise to make exercise part of routine and to increase amount of physical activity.;Long Term: Exercising regularly at least 3-5 days a week.       Increase Strength and Stamina  Yes       Intervention  Provide advice, education, support and counseling about physical activity/exercise needs.;Develop an individualized exercise prescription for aerobic and resistive training based on initial evaluation findings, risk stratification, comorbidities and participant's personal goals.       Expected Outcomes  Short Term: Increase workloads from initial exercise prescription for resistance, speed, and METs.;Short Term: Perform resistance training exercises routinely during rehab and add in resistance training at home;Long Term: Improve cardiorespiratory fitness, muscular endurance and strength as measured by increased METs and functional capacity (6MWT)       Able to understand and use rate of perceived exertion (RPE) scale  Yes       Intervention  Provide education and explanation on how to use RPE scale       Expected Outcomes  Short Term: Able to use RPE daily in rehab to express subjective intensity level;Long Term:  Able to use RPE to guide intensity level when exercising independently       Able to understand and  use Dyspnea scale  Yes       Intervention  Provide education and explanation on how to use Dyspnea scale       Expected Outcomes  Short Term: Able to use Dyspnea scale daily in rehab to express subjective sense of shortness of breath during exertion;Long Term: Able to use Dyspnea scale to guide intensity level when exercising independently       Knowledge and understanding of Target Heart Rate Range (THRR)  Yes       Intervention  Provide education and  explanation of THRR including how the numbers were predicted and where they are located for reference       Expected Outcomes  Short Term: Able to use daily as guideline for intensity in rehab;Short Term: Able to state/look up THRR;Long Term: Able to use THRR to govern intensity when exercising independently       Able to check pulse independently  Yes       Intervention  Review the importance of being able to check your own pulse for safety during independent exercise;Provide education and demonstration on how to check pulse in carotid and radial arteries.       Expected Outcomes  Short Term: Able to explain why pulse checking is important during independent exercise;Long Term: Able to check pulse independently and accurately       Understanding of Exercise Prescription  Yes       Intervention  Provide education, explanation, and written materials on patient's individual exercise prescription       Expected Outcomes  Short Term: Able to explain program exercise prescription;Long Term: Able to explain home exercise prescription to exercise independently          Exercise Goals Re-Evaluation : Exercise Goals Re-Evaluation    Row [name] 05/13/19 1456 05/21/19 0918 05/27/19 1410 06/03/19 1152 06/17/19 1406     Exercise Goal Re-Evaluation   Exercise Goals Review  Able to understand and use rate of perceived exertion (RPE) scale;Knowledge and understanding of Target Heart Rate Range (THRR);Understanding of Exercise Prescription  Increase Strength and Stamina;Increase Physical Activity;Understanding of Exercise Prescription  Increase Strength and Stamina;Increase Physical Activity;Understanding of Exercise Prescription  Increase Physical Activity;Increase Strength and Stamina;Understanding of Exercise Prescription  Increase Physical Activity;Increase Strength and Stamina;Understanding of Exercise Prescription   Comments  Reviewed RPE scale, THR and program prescription with pt today.  Pt voiced  understanding and was given a copy of goals to take home.  Kent [name] is off to a good start in rehab. He has completed three full days of exercise.  He is already up to 2.8 METs on the XR and getting into the routine.  We will continue to montior his progression.  Kent [name] is doing well in rehab.  He is starting to feel better and is staying active at home. Reviewed home exercise with pt today.  Pt plans to walking at home for exercise.  Reviewed THR, pulse, RPE, sign and symptoms, NTG use, and when to call 911 or [name].  Also discussed weather considerations and indoor options.  Pt voiced understanding.  Kent [name] is off to a good start in rehab.  He is now able to complete all 30 min each day without stopping.  He is up to 4.9 METs on the XR.  We will continue to monitor his progress.  Kent [name] is doing well at home. He is walking at home every day either 2 or 3 times a day.  He usually averages about 30 min or 15 min  is the rain is better.  He is doing well with his stamina. but his strength is not where he wants it to be and would to increase.  He is coming off his steriords to prepare for his next dose in the spine.   Expected Outcomes  Short: Use RPE daily to regulate intensity. Long: Follow program prescription in THR.  Short: Continue to attend regularly.  Long: Continue follow program prescription.  Short: Continue to walk daily.  Long: Continue to increase strength.  Short: Continue to attend regularly.  Long: Continue to increase stamina.  Short: Continue to walk daily.  Long: Continue to increase stamina.   [place] [name] 06/26/19 1541 07/09/19 1439 07/15/19 1407 07/25/19 1423       Exercise Goal Re-Evaluation   Exercise Goals Review  Increase Physical Activity;Increase Strength and Stamina;Understanding of Exercise Prescription  Increase Physical Activity;Increase Strength and Stamina;Understanding of Exercise Prescription  Increase Physical Activity;Increase Strength and Stamina  Increase Physical Activity;Increase  Strength and Stamina;Able to understand and use rate of perceived exertion (RPE) scale;Knowledge and understanding of Target Heart Rate Range (THRR);Able to check pulse independently;Understanding of Exercise Prescription    Comments  Kent [name] had a spinal injection this week and hopes it will help with his back pain.  Kent [name] was able to return to exercise last week.  He was able to increase both the XR and T5 NuStep last week!!  We will continue to monitor his progress.  He wants to exercise at home but his back hurts him too much. He is on Plavix and he cannot get his back worked on. He is able to walk about a quarter mile a day.His rehab is going well but his back and neck are always hurting.  Kent [name] has been able to increase levels on most machines.  His overall MET level has improved    Expected Outcomes  Short: Return to rehab. Long: Continue to walk on off days.  Short: Continue to increase Long: Walk more on off days!  Short: attend [facility] to improve pain. Long: finish [facility] and continue to exercise independently.  Short - conintue to attend consistently Long - maintain exercise on his own       Discharge Exercise Prescription (Final Exercise Prescription Changes): Exercise Prescription Changes - 07/25/19 1400      Response to Exercise   Blood Pressure (Admit)  128/68    Blood Pressure (Exercise)  144/60    Blood Pressure (Exit)  120/66    Heart Rate (Admit)  62 bpm    Heart Rate (Exercise)  123 bpm    Heart Rate (Exit)  72 bpm    Rating of Perceived Exertion (Exercise)  14    Symptoms  none    Duration  Continue with 30 min of aerobic exercise without signs/symptoms of physical distress.    Intensity  THRR unchanged      Progression   Progression  Continue to progress workloads to maintain intensity without signs/symptoms of physical distress.    Average METs  3.6      Resistance Training   Training Prescription  Yes    Weight  5 lb    Reps  10-15      Interval Training    Interval Training  No      Arm Ergometer   Level  4    Minutes  15    METs  3.1      REL-XR   Level  6    Minutes  15  METs  5.5      T5 Nustep   Level  4    Minutes  15    METs  2.4      Biostep-RELP   Level  5    Minutes  15    METs  4       Nutrition:  Target Goals: Understanding of nutrition guidelines, daily intake of sodium <155m, cholesterol <2057m calories 30% from fat and 7% or less from saturated fats, daily to have 5 or more servings of fruits and vegetables.  Biometrics: Pre Biometrics - 05/07/19 1656      Pre Biometrics   Height  5' 11.25" (1.81 m)    Weight  230 lb (104.3 kg)    BMI (Calculated)  31.84        Nutrition Therapy Plan and Nutrition Goals: Nutrition Therapy & Goals - 05/07/19 1639      Nutrition Therapy   Diet  Low Na, HH, diabetic diet    Protein (specify units)  85g    Fiber  25 grams    Whole Grain Foods  3 servings    Saturated Fats  12 max. grams    Fruits and Vegetables  5 servings/day    Sodium  1.5 grams      Personal Nutrition Goals   Nutrition Goal  ST: stop eating take out burgers (talked about switching to chicken at [employer]) LT: increase stamina    Comments  Pt reports eating fast food for almost every meal due to wife not wanting healthy meals. Pt reports loving green leafy vegetables and fruit. Pt will make homemade sorbet at night. Discussed HH eating, MyPlate, low Na, healthy snacking, BG control, and suggested meal prepping or cooking foods and eating something different from his wife. Pt reports eating fruit at work (catelope and watermelon). Pt does not have teeth but can eat somethings well. possible goal to eat 2 snacks during the day to help maintain BG. Pt reports that his doctor said he isn't needed enough- BG may be low. On metformin. pt does his best to choose healthy options when eating out such as vegetables, taking skin of chicken, etc.      Intervention Plan   Intervention  Prescribe, educate and  counsel regarding individualized specific dietary modifications aiming towards targeted core components such as weight, hypertension, lipid management, diabetes, heart failure and other comorbidities.;Nutrition handout(s) given to patient.    Expected Outcomes  Short Term Goal: Understand basic principles of dietary content, such as calories, fat, sodium, cholesterol and nutrients.;Short Term Goal: A plan has been developed with personal nutrition goals set during dietitian appointment.;Long Term Goal: Adherence to prescribed nutrition plan.       Nutrition Assessments:   Nutrition Goals Re-Evaluation: Nutrition Goals Re-Evaluation    [facility] [facility] 05/15/19 1516 06/10/19 1416 07/03/19 1413 07/17/19 1408       Goals   Current Weight  -  -  -  236 lb 6.4 oz (107.2 kg)    Nutrition Goal  ST: stop eating take out burgers (talked about switching to chicken at [employer]) LT: increase stamina  ST: try best when eating out to choose healthy options LT: increase stamina  ST: try best when eating out to choose healthy options, monitor energy levels LT: increase stamina  ST: try best when eating out to choose healthy options, monitor energy levels LT: increase stamina    Comment  Talked to pt again about whole grains and fiber and BG, reiterated that  pt DR may say that he isn't eating enough due to Bg being low at times, talked about eating consistenly and good food choices to maintain BG. Pt reports still eating the best he can; eating lots of vegetables choosing chicken without skin, but will have white rice as he doesn't like brown rice.  Talked to pt again about whole grains and fiber and BG, as well as general healthy choices; discussed on the go healthy options, pt was not interested and insists he will continue with take out for lunch especially since his wife is very picky. Pt reports still eating the best he can when he cooks; eating lots of vegetables choosing chicken without skin, but will have white rice  as he doesn't like brown rice.  Talked to pt again about whole grains and fiber and BG, as well as general healthy choices;  Pt reports still eating the best he can when he cooks; eating lots of vegetables choosing chicken without skin, but will have white rice as he doesn't like brown rice. Pt reports energy levels down discussed dietary changes he could make to help with that such as adding protein or fat to B (cheerios). Pt reports he thinks his mediction, but is also not sleeping due to being a light sleeper. Discussed sleep hygiene techniques.  Talked to pt again, pt reports choosing general healthy choices; pt rpeort he has 3lbs of fluid on today but has not had any changes in his meals. Discussed the role of salt. Pt reports doing well with his nutrition and wants to continue what he is doing and not make any more changes. pt is still not sleeping well.    Expected Outcome  switch to chicken instead of burgers when getting takeout LT: increase stamina, maintain BG  ST: try best when eating out to choose healthy options LT: increase stamina  ST: try best when eating out to choose healthy options, monitor energy levels LT: increase stamina  ST: try best when eating out to choose healthy options, monitor energy levels LT: increase stamina       Nutrition Goals Discharge (Final Nutrition Goals Re-Evaluation): Nutrition Goals Re-Evaluation - 07/17/19 1408      Goals   Current Weight  236 lb 6.4 oz (107.2 kg)    Nutrition Goal  ST: try best when eating out to choose healthy options, monitor energy levels LT: increase stamina    Comment  Talked to pt again, pt reports choosing general healthy choices; pt rpeort he has 3lbs of fluid on today but has not had any changes in his meals. Discussed the role of salt. Pt reports doing well with his nutrition and wants to continue what he is doing and not make any more changes. pt is still not sleeping well.    Expected Outcome  ST: try best when eating out to  choose healthy options, monitor energy levels LT: increase stamina       Psychosocial: Target Goals: Acknowledge presence or absence of significant depression and/or stress, maximize coping skills, provide positive support system. Participant is able to verbalize types and ability to use techniques and skills needed for reducing stress and depression.   Initial Review & Psychosocial Screening:   Quality of Life Scores:   Scores of 19 and below usually indicate a poorer quality of life in these areas.  A difference of  2-3 points is a clinically meaningful difference.  A difference of 2-3 points in the total score of the Quality of Life  Index has been associated with significant improvement in overall quality of life, self-image, physical symptoms, and general health in studies assessing change in quality of life.  PHQ-9: Recent Review Flowsheet Data    There is no flowsheet data to display.     Interpretation of Total Score  Total Score Depression Severity:  1-4 = Minimal depression, 5-9 = Mild depression, 10-14 = Moderate depression, 15-19 = Moderately severe depression, 20-27 = Severe depression   Psychosocial Evaluation and Intervention:   Psychosocial Re-Evaluation: Psychosocial Re-Evaluation    Kent [name] 05/27/19 1415 06/17/19 1408 07/15/19 1413         Psychosocial Re-Evaluation   Current issues with  Current Stress Concerns  Current Stress Concerns  Current Sleep Concerns;Current Depression;Current Stress Concerns;Current Anxiety/Panic;Current Psychotropic Meds     Comments  Kent [name] is doing well in rehab.  He walks with his dog daily.  He does not let stress get to him and stays positive overall.  He sleeps good and is compliant with his CPAP.  Kent [name] has been doing well in rehab.  He continues to walk daily.  Overall, he is staying positive and stress is bearable. 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  He wants to retire but afraid to do it due to finances.   Still sleeps good and compliant with CPAP.  He uses his mask regulalry and they make him frustrated.  He has been treated for depression for 40 years and anxiety since his heart surgery. He has married three times, had open heart surgery when he was 45. 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 He prays and tries to cope with his depression.     Expected Outcomes  Short: Continue to practice self care.  Long: Continue to stay positive.  Short: Continue to walk and self care to stay postive.  Long: Continue to consider retirement.  Short: continue taking medication to help with his depression and anxiety. Long: maintain exercise to keep stress at a minimum.     Interventions  Encouraged to attend Cardiac Rehabilitation for the exercise  Encouraged to attend Cardiac Rehabilitation for the exercise  Encouraged to attend Cardiac Rehabilitation for the exercise     Continue Psychosocial Services   Follow up required by staff  Follow up required by staff  Follow up required by staff        Psychosocial Discharge (Final Psychosocial Re-Evaluation): Psychosocial Re-Evaluation - 07/15/19 1413      Psychosocial Re-Evaluation   Current issues with  Current Sleep Concerns;Current Depression;Current Stress Concerns;Current Anxiety/Panic;Current Psychotropic Meds    Comments  He has been treated for depression for 40 years and anxiety since his heart surgery. He has married three times, had open heart surgery when he was 77. 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 He prays and tries to cope with his depression.    Expected Outcomes  Short: continue taking medication to help with his depression and anxiety. Long: maintain exercise to keep stress at a minimum.    Interventions  Encouraged to attend Cardiac Rehabilitation for the exercise    Continue  Psychosocial Services   Follow up required by staff       Vocational Rehabilitation: Provide vocational rehab assistance to qualifying candidates.   Vocational  Rehab Evaluation & Intervention: Vocational Rehab - 04/29/19 1434      Initial Vocational Rehab Evaluation & Intervention   Assessment shows need for Vocational Rehabilitation  No       Education: Education Goals: Education classes will be provided on a variety of topics geared toward better understanding of heart health and risk factor modification. Participant will state understanding/return demonstration of topics presented as noted by education test scores.  Learning Barriers/Preferences:   Education Topics:  AED/CPR: - Group verbal and written instruction with the use of models to demonstrate the basic use of the AED with the basic ABC's of resuscitation.   General Nutrition Guidelines/Fats and Fiber: -Group instruction provided by verbal, written material, models and posters to present the general guidelines for heart healthy nutrition. Gives an explanation and review of dietary fats and fiber.   Controlling Sodium/Reading Food Labels: -Group verbal and written material supporting the discussion of sodium use in heart healthy nutrition. Review and explanation with models, verbal and written materials for utilization of the food label.   Exercise Physiology & General Exercise Guidelines: - Group verbal and written instruction with models to review the exercise physiology of the cardiovascular system and associated critical values. Provides general exercise guidelines with specific guidelines to those with heart or lung disease.    Aerobic Exercise & Resistance Training: - Gives group verbal and written instruction on the various components of exercise. Focuses on aerobic and resistive training programs and the benefits of this training and how to safely progress through these programs..   Flexibility, Balance,  Mind/Body Relaxation: Provides group verbal/written instruction on the benefits of flexibility and balance training, including mind/body exercise modes such as yoga, pilates and tai chi.  Demonstration and skill practice provided.   Stress and Anxiety: - Provides group verbal and written instruction about the health risks of elevated stress and causes of high stress.  Discuss the correlation between heart/lung disease and anxiety and treatment options. Review healthy ways to manage with stress and anxiety.   Depression: - Provides group verbal and written instruction on the correlation between heart/lung disease and depressed mood, treatment options, and the stigmas associated with seeking treatment.   Anatomy & Physiology of the Heart: - Group verbal and written instruction and models provide basic cardiac anatomy and physiology, with the coronary electrical and arterial systems. Review of Valvular disease and Heart Failure   Cardiac Procedures: - Group verbal and written instruction to review commonly prescribed medications for heart disease. Reviews the medication, class of the drug, and side effects. Includes the steps to properly store meds and maintain the prescription regimen. (beta blockers and nitrates)   Cardiac Medications I: - Group verbal and written instruction to review commonly prescribed medications for heart disease. Reviews the medication, class of the drug, and side effects. Includes the steps to properly store meds and maintain the prescription regimen.   Cardiac Medications II: -Group verbal and written instruction to review commonly prescribed medications for heart disease. Reviews the medication, class of the drug, and side effects. (all other drug classes)    Go Sex-Intimacy & Heart Disease, Get SMART - Goal Setting: - Group verbal and written instruction through game format to discuss heart disease and the return to sexual intimacy. Provides group verbal and  written material to discuss and apply goal setting through the application of the S.M.A.R.T. Method.   Other Matters of the Heart: - Provides group verbal, written materials and models to describe Stable Angina and Peripheral  Artery. Includes description of the disease process and treatment options available to the cardiac patient.   Exercise & Equipment Safety: - Individual verbal instruction and demonstration of equipment use and safety with use of the equipment.   Cardiac Rehab from 05/07/2019 in [name]  Date  05/07/19  Educator  Kent [name]  Instruction Review Code  1- Verbalizes Understanding      Infection Prevention: - Provides verbal and written material to individual with discussion of infection control including proper hand washing and proper equipment cleaning during exercise session.   Cardiac Rehab from 05/07/2019 in [name]  Date  05/07/19  Educator  Kent [name]  Instruction Review Code  1- Verbalizes Understanding      Falls Prevention: - Provides verbal and written material to individual with discussion of falls prevention and safety.   Cardiac Rehab from 05/07/2019 in [name]  Date  05/07/19  Educator  Kent [name]  Instruction Review Code  1- Verbalizes Understanding      Diabetes: - Individual verbal and written instruction to review signs/symptoms of diabetes, desired ranges of glucose level fasting, after meals and with exercise. Acknowledge that pre and post exercise glucose checks will be done for 3 sessions at entry of program.   Cardiac Rehab from 05/07/2019 in [name]  Date  05/07/19  Educator  Kent [name]  Instruction Review Code  1- Verbalizes Understanding      Know Your Numbers and Risk Factors: -Group verbal and written instruction about important numbers in your health.  Discussion of what are risk factors and how they play a role in the disease process.  Review of Cholesterol, Blood  Pressure, Diabetes, and BMI and the role they play in your overall health.   Sleep Hygiene: -Provides group verbal and written instruction about how sleep can affect your health.  Define sleep hygiene, discuss sleep cycles and impact of sleep habits. Review good sleep hygiene tips.    Other: -Provides group and verbal instruction on various topics (see comments)   Knowledge Questionnaire Score:   Core Components/Risk Factors/Patient Goals at Admission: Personal Goals and Risk Factors at Admission - 05/07/19 1656      Core Components/Risk Factors/Patient Goals on Admission    Weight Management  Yes;Obesity;Weight Loss    Intervention  Weight Management: Develop a combined nutrition and exercise program designed to reach desired caloric intake, while maintaining appropriate intake of nutrient and fiber, sodium and fats, and appropriate energy expenditure required for the weight goal.;Weight Management: Provide education and appropriate resources to help participant work on and attain dietary goals.;Obesity: Provide education and appropriate resources to help participant work on and attain dietary goals.;Weight Management/Obesity: Establish reasonable short term and long term weight goals.    Admit Weight  230 lb (104.3 kg)    Goal Weight: Short Term  225 lb (102.1 kg)    Goal Weight: Long Term  220 lb (99.8 kg)    Expected Outcomes  Short Term: Continue to assess and modify interventions until short term weight is achieved;Long Term: Adherence to nutrition and physical activity/exercise program aimed toward attainment of established weight goal;Weight Loss: Understanding of general recommendations for a balanced deficit meal plan, which promotes 1-2 lb weight loss per week and includes a negative energy balance of [phone] kcal/d;Understanding recommendations for meals to include 15-35% energy as protein, 25-35% energy from fat, 35-60% energy from carbohydrates, less than 241m of dietary  cholesterol, 20-35 gm of total  fiber daily;Understanding of distribution of calorie intake throughout the day with the consumption of 4-5 meals/snacks    Diabetes  Yes    Intervention  Provide education about signs/symptoms and action to take for hypo/hyperglycemia.;Provide education about proper nutrition, including hydration, and aerobic/resistive exercise prescription along with prescribed medications to achieve blood glucose in normal ranges: Fasting glucose 65-99 mg/dL    Expected Outcomes  Short Term: Participant verbalizes understanding of the signs/symptoms and immediate care of hyper/hypoglycemia, proper foot care and importance of medication, aerobic/resistive exercise and nutrition plan for blood glucose control.;Long Term: Attainment of HbA1C < 7%.    Hypertension  Yes    Intervention  Provide education on lifestyle modifcations including regular physical activity/exercise, weight management, moderate sodium restriction and increased consumption of fresh fruit, vegetables, and low fat dairy, alcohol moderation, and smoking cessation.;Monitor prescription use compliance.    Expected Outcomes  Short Term: Continued assessment and intervention until BP is < 140/55m HG in hypertensive participants. < 130/869mHG in hypertensive participants with diabetes, heart failure or chronic kidney disease.;Long Term: Maintenance of blood pressure at goal levels.    Lipids  Yes    Intervention  Provide education and support for participant on nutrition & aerobic/resistive exercise along with prescribed medications to achieve LDL <7022mHDL >43m14m  Expected Outcomes  Short Term: Participant states understanding of desired cholesterol values and is compliant with medications prescribed. Participant is following exercise prescription and nutrition guidelines.;Long Term: Cholesterol controlled with medications as prescribed, with individualized exercise RX and with personalized nutrition plan. Value goals: LDL <  70mg22mL > 40 mg.       Core Components/Risk Factors/Patient Goals Review:  Goals and Risk Factor Review    Row [name] 05/27/19 1417 06/17/19 1411 07/15/19 1419         Core Components/Risk Factors/Patient Goals Review   Personal Goals Review  Weight Management/Obesity;Hypertension;Diabetes  Weight Management/Obesity;Hypertension;Diabetes  Weight Management/Obesity;Hypertension;Diabetes;Lipids     Review  Kent [name] Kent Beltsoing well at home.  His weight stays between 215-218lbs.  Overall, his blood sugars have been good and he has not noticed any lows in his system.  Blood pressures have been good and he checks them daily at home in the morning along with his sugars. He is doing well on his medications.  Kent [name] Kent [name] to do well.  His weight is 227-230 lbs recently and seems to be related to his fluid levels which flucuate greatly.  He is doing his best to stay on top of it, he also drinks a lot of water.  Sugars have been good and stay around 110 mg/Dl on average.  Today he was 93 fasting.   His blood pressures have been in the 120s-130s/70s.  He has been watching his sodium to help with control.  He has also uses our YouTube channel for exercise and education.  He picks up food alot for meals for him and his wife. He cooks sometimes but is eating everyting. Rakeem checks his sugar at home and it has been stable. He states his lipids have been better. Blood pressure has been normal at home when he checks.     Expected Outcomes  Short: Continue to work on weight loss.  Long: Continue to manage diabetes.  Short: Continue to work on weight loss.  Long: Continue to manage diabetes.  Short: lose 5 pounds. Long: lose more weight with  diet and exercise.        Core Components/Risk Factors/Patient Goals at Discharge (Final  Review):  Goals and Risk Factor Review - 07/15/19 1419      Core Components/Risk Factors/Patient Goals Review   Personal Goals Review  Weight Management/Obesity;Hypertension;Diabetes;Lipids     Review  He picks up food alot for meals for him and his wife. He cooks sometimes but is eating everyting. Jostin checks his sugar at home and it has been stable. He states his lipids have been better. Blood pressure has been normal at home when he checks.    Expected Outcomes  Short: lose 5 pounds. Long: lose more weight with  diet and exercise.       ITP Comments: ITP Comments    Row [name] 04/29/19 1432 05/07/19 1634 05/15/19 1412 06/12/19 0627 06/19/19 1410   ITP Comments  Initial Virtual Vist completed today   Obtained consent reviewd Risk factors and set appt for EP and RD Followup.  Completed 6MWT and nutrition evaluations today.  Documentation for diagnosis can be found in [facility] encounter 04/01/2019.  Initial ITP created and sent for review to Dr. Emily Filbert, Medical Director.  30 day review cycle restarting  after being closed since March 16 because of  Covid 19 pandemic. Program opened to patients on July 6. Not all have returned. ITP updated and sent to Medical Director for review,changes as needed and signature  30 Day Review Completed today. Continue with ITP unless changed by Medical Director review.  Kent [name] says he will not be here Monday as he has spinal injections that day.  WIll need to see when he can exercise again.   Ledbetter [name] 06/26/19 1541 07/10/19 0638 08/07/19 1231       ITP Comments  Spinal injection on 8/24 out for week.  30 Day review. Continue with ITP unless directed changes per Medical Director review.  30 day review completed. ITP sent to Dr. Emily Filbert, Medical Director of Cardiac and Pulmonary Rehab. Continue with ITP unless changes are made by physician.  Department closed starting 10/2 until further notice by infection prevention and Health at Work teams for [place].        Comments: 30 day review

## 2019-08-12 ENCOUNTER — Ambulatory Visit: Payer: PRIVATE HEALTH INSURANCE

## 2019-08-12 ENCOUNTER — Encounter: Payer: Self-pay | Admitting: *Deleted

## 2019-08-12 DIAGNOSIS — I214 Non-ST elevation (NSTEMI) myocardial infarction: Secondary | ICD-10-CM

## 2019-08-12 DIAGNOSIS — Z955 Presence of coronary angioplasty implant and graft: Secondary | ICD-10-CM

## 2019-08-12 IMAGING — CR DG CHEST 2V
1 series · 2 of 2 positions shown · non-contrast
Comparison: 03/29/2013

CLINICAL DATA: Palpitations and chest tightness

EXAM:
CHEST - 2 VIEW

[Series 1: dg chest 2 view · 0.14mm/px · 2 of 2 slices shown]
[im 1/2]
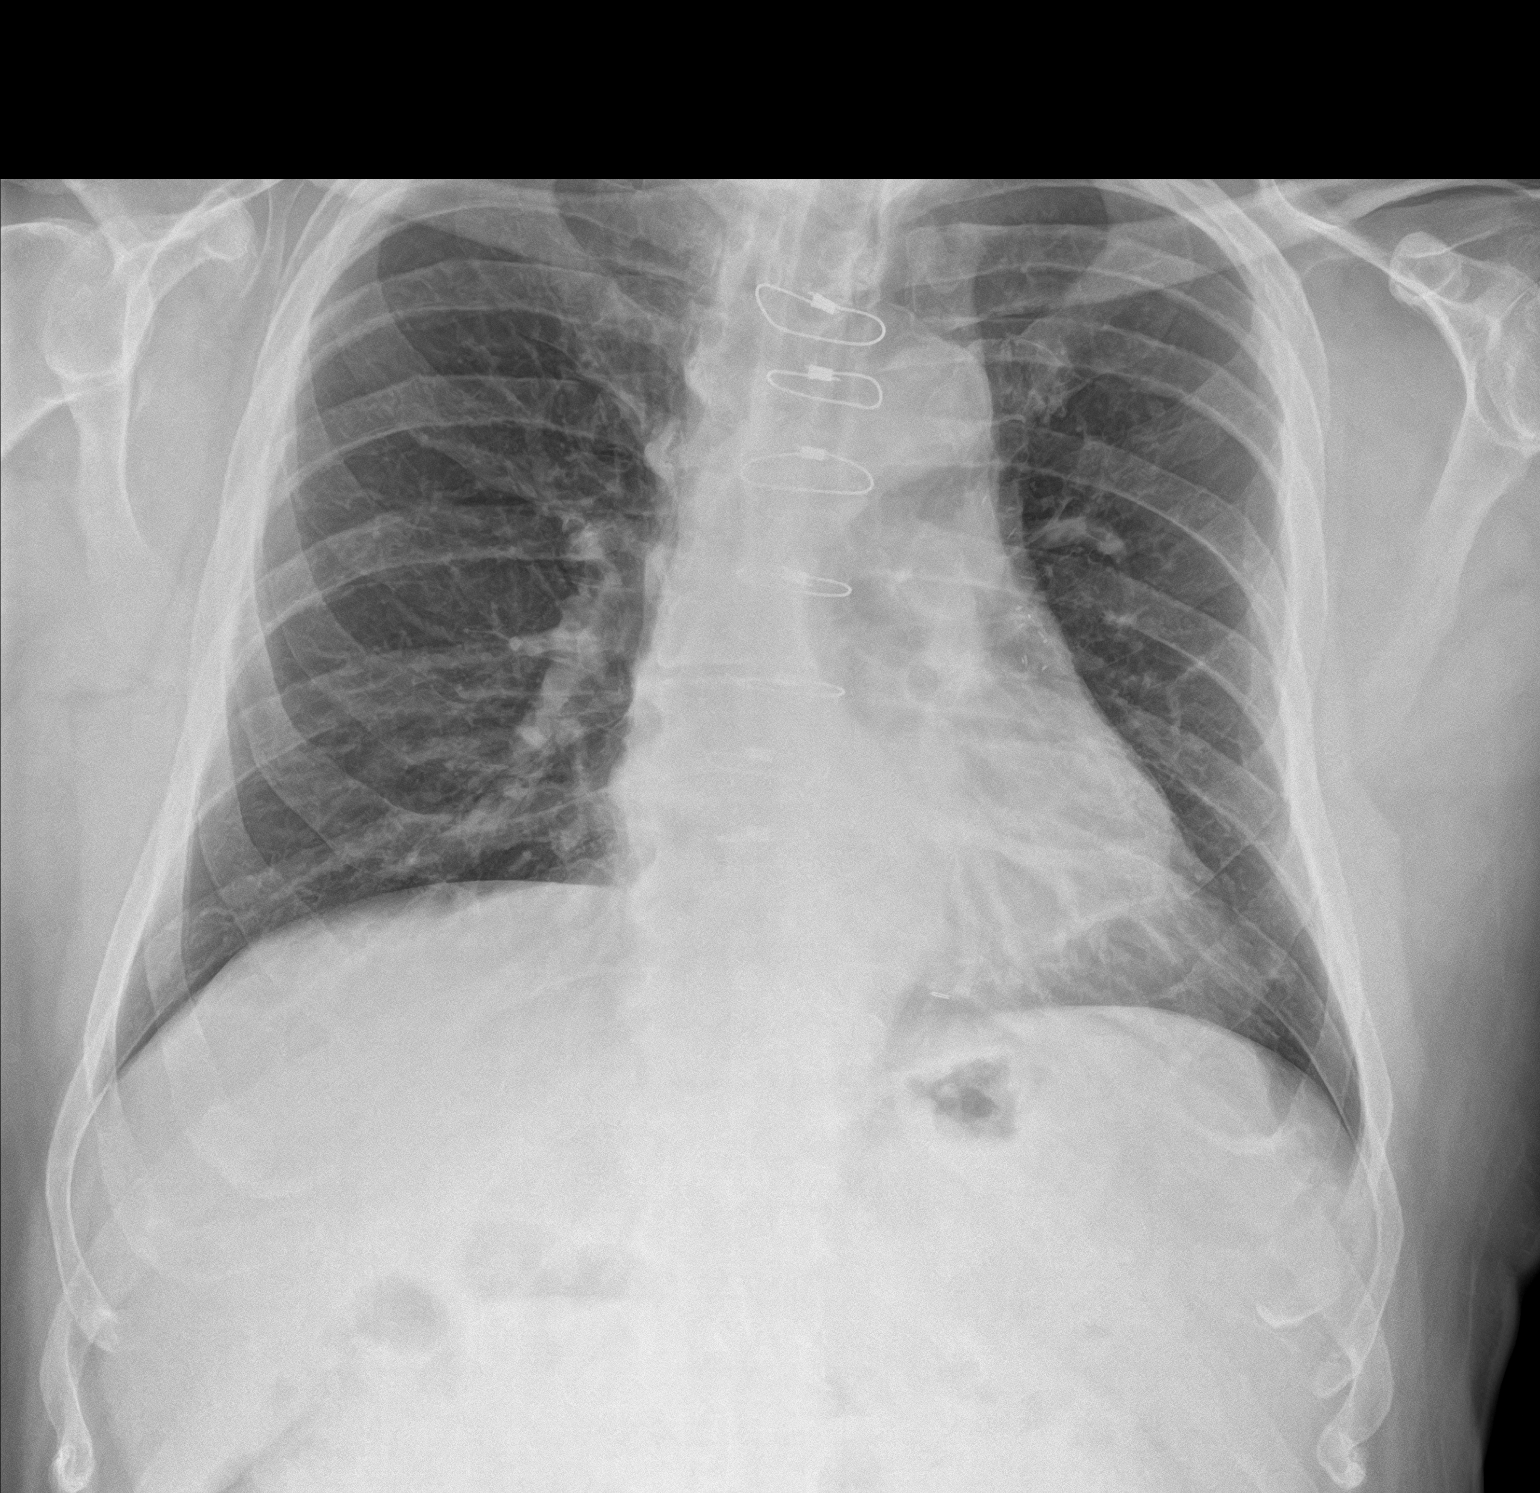
[im 2/2]
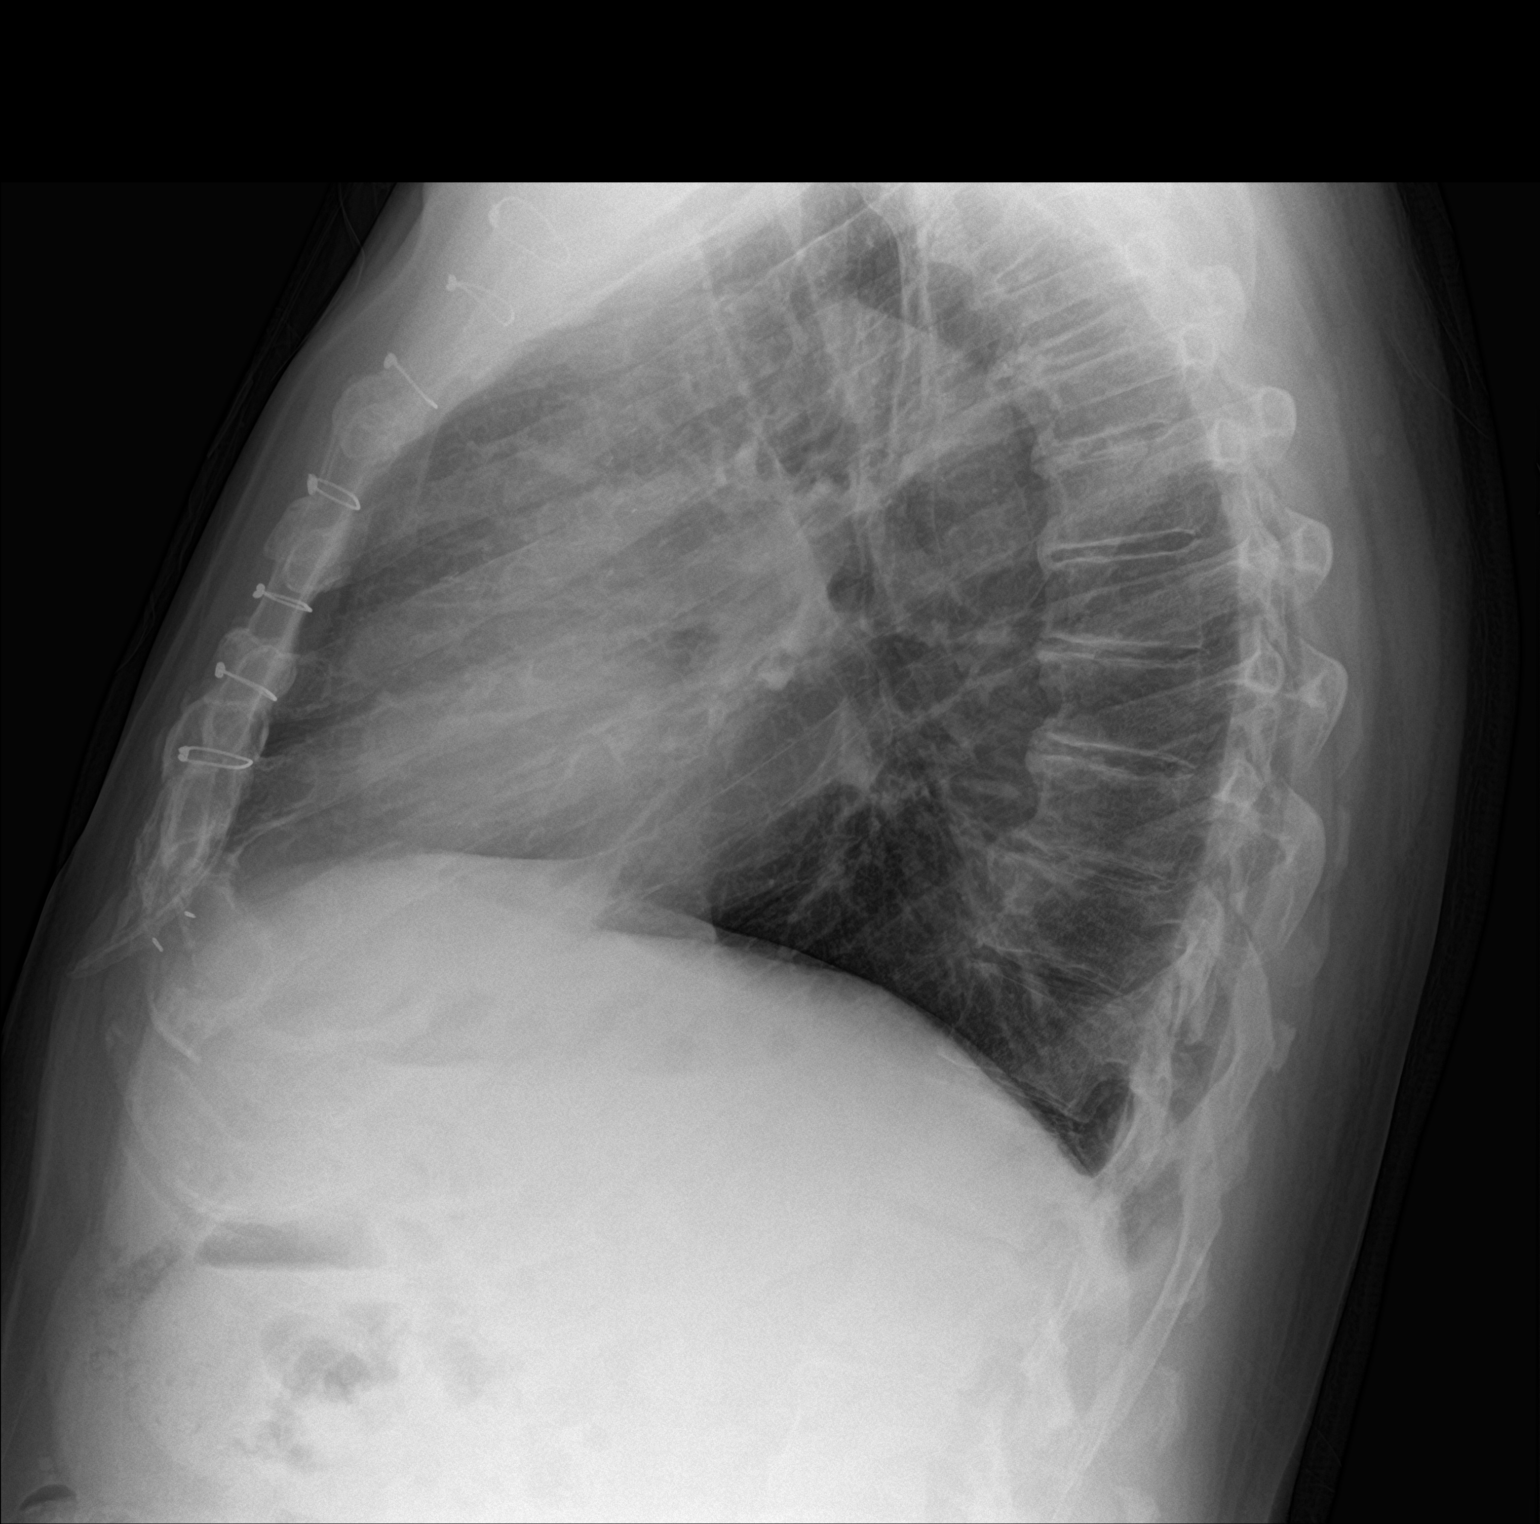

[2 of 2 positions shown; findings below may reference images not displayed]

FINDINGS: Normal heart size. Lungs clear. No pneumothorax. No pleural
effusion.
IMPRESSION: No active cardiopulmonary disease.

## 2019-08-12 NOTE — Progress Notes (Signed)
Discharge Progress Report  Patient Details  Name: Kent [name] MRN: [id number] Date of Birth: 1956-02-08 Referring Provider:     Cardiac Rehab from 05/07/2019 in [facility]  Referring Provider  Paraschos, Alexander MD       Number of Visits: 20  Reason for Discharge:  Early Exit:  Back to work  Smoking History:  Social History   Tobacco Use  Smoking Status Former Smoker  Smokeless Tobacco Never Used    Diagnosis:  NSTEMI (non-ST elevation myocardial infarction) ([place])  Status post coronary artery stent placement  ADL [facility]:   Initial Exercise Prescription: Initial Exercise Prescription - 05/07/19 1600      Date of Initial Exercise RX and Referring Provider   Date  05/07/19    Referring Provider  Paraschos, Alexander MD      Recumbant Bike   Level  3    RPM  50    Watts  41    Minutes  15    METs  3      NuStep   Level  2    SPM  80    Minutes  15    METs  2.5      Arm Ergometer   Level  3    Watts  58    RPM  30    Minutes  15    METs  2.5      Recumbant Elliptical   Level  1    RPM  50    Minutes  15    METs  2.5      T5 Nustep   Level  2    SPM  80    Minutes  15    METs  2.5      Prescription Details   Frequency (times per week)  2    Duration  Progress to 30 minutes of continuous aerobic without signs/symptoms of physical distress      Intensity   THRR 40-80% of Max Heartrate  99-136    Ratings of Perceived Exertion  11-13    Perceived Dyspnea  0-4      Progression   Progression  Continue to progress workloads to maintain intensity without signs/symptoms of physical distress.      Resistance Training   Training Prescription  Yes    Weight  3 lbs    Reps  10-15       Discharge Exercise Prescription (Final Exercise Prescription Changes): Exercise Prescription Changes - 07/25/19 1400      Response to Exercise   Blood Pressure (Admit)  128/68    Blood Pressure (Exercise)  144/60    Blood Pressure  (Exit)  120/66    Heart Rate (Admit)  62 bpm    Heart Rate (Exercise)  123 bpm    Heart Rate (Exit)  72 bpm    Rating of Perceived Exertion (Exercise)  14    Symptoms  none    Duration  Continue with 30 min of aerobic exercise without signs/symptoms of physical distress.    Intensity  THRR unchanged      Progression   Progression  Continue to progress workloads to maintain intensity without signs/symptoms of physical distress.    Average METs  3.6      Resistance Training   Training Prescription  Yes    Weight  5 lb    Reps  10-15      Interval Training   Interval Training  No  Arm Ergometer   Level  4    Minutes  15    METs  3.1      REL-XR   Level  6    Minutes  15    METs  5.5      T5 Nustep   Level  4    Minutes  15    METs  2.4      Biostep-RELP   Level  5    Minutes  15    METs  4       Functional Capacity: 6 Minute Walk    Row Name 05/07/19 1645         6 Minute Walk   Phase  Initial     Distance  1320 feet     Walk Time  6 minutes     # of Rest Breaks  0     MPH  2.5     METS  3.24     RPE  12     Perceived Dyspnea   1     VO2 Peak  11.34     Symptoms  Yes (comment)     Comments  fatigue and foot drag     Resting HR  61 bpm     Resting BP  146/74     Resting Oxygen Saturation   98 %     Exercise Oxygen Saturation  during 6 min walk  96 %     Max Ex. HR  107 bpm     Max Ex. BP  154/76     2 Minute Post BP  134/62        Psychological, QOL, Others - Outcomes: PHQ 2/9: No flowsheet data found.  Quality of Life:   Personal Goals: Goals established at orientation with interventions provided to work toward goal. Personal Goals and Risk Factors at Admission - 05/07/19 1656      Core Components/Risk Factors/Patient Goals on Admission    Weight Management  Yes;Obesity;Weight Loss    Intervention  Weight Management: Develop a combined nutrition and exercise program designed to reach desired caloric intake, while maintaining  appropriate intake of nutrient and fiber, sodium and fats, and appropriate energy expenditure required for the weight goal.;Weight Management: Provide education and appropriate resources to help participant work on and attain dietary goals.;Obesity: Provide education and appropriate resources to help participant work on and attain dietary goals.;Weight Management/Obesity: Establish reasonable short term and long term weight goals.    Admit Weight  230 lb (104.3 kg)    Goal Weight: Short Term  225 lb (102.1 kg)    Goal Weight: Long Term  220 lb (99.8 kg)    Expected Outcomes  Short Term: Continue to assess and modify interventions until short term weight is achieved;Long Term: Adherence to nutrition and physical activity/exercise program aimed toward attainment of established weight goal;Weight Loss: Understanding of general recommendations for a balanced deficit meal plan, which promotes 1-2 lb weight loss per week and includes a negative energy balance of [phone] kcal/d;Understanding recommendations for meals to include 15-35% energy as protein, 25-35% energy from fat, 35-60% energy from carbohydrates, less than 253m of dietary cholesterol, 20-35 gm of total fiber daily;Understanding of distribution of calorie intake throughout the day with the consumption of 4-5 meals/snacks    Diabetes  Yes    Intervention  Provide education about signs/symptoms and action to take for hypo/hyperglycemia.;Provide education about proper nutrition, including hydration, and aerobic/resistive exercise prescription along with prescribed medications to  achieve blood glucose in normal ranges: Fasting glucose 65-99 mg/dL    Expected Outcomes  Short Term: Participant verbalizes understanding of the signs/symptoms and immediate care of hyper/hypoglycemia, proper foot care and importance of medication, aerobic/resistive exercise and nutrition plan for blood glucose control.;Long Term: Attainment of HbA1C < 7%.    Hypertension  Yes     Intervention  Provide education on lifestyle modifcations including regular physical activity/exercise, weight management, moderate sodium restriction and increased consumption of fresh fruit, vegetables, and low fat dairy, alcohol moderation, and smoking cessation.;Monitor prescription use compliance.    Expected Outcomes  Short Term: Continued assessment and intervention until BP is < 140/55m HG in hypertensive participants. < 130/847mHG in hypertensive participants with diabetes, heart failure or chronic kidney disease.;Long Term: Maintenance of blood pressure at goal levels.    Lipids  Yes    Intervention  Provide education and support for participant on nutrition & aerobic/resistive exercise along with prescribed medications to achieve LDL <7072mHDL >72m11m  Expected Outcomes  Short Term: Participant states understanding of desired cholesterol values and is compliant with medications prescribed. Participant is following exercise prescription and nutrition guidelines.;Long Term: Cholesterol controlled with medications as prescribed, with individualized exercise RX and with personalized nutrition plan. Value goals: LDL < 70mg35mL > 40 mg.        Personal Goals Discharge: Goals and Risk Factor Review    Row Name 05/27/19 1417 06/17/19 1411 07/15/19 1419         Core Components/Risk Factors/Patient Goals Review   Personal Goals Review  Weight Management/Obesity;Hypertension;Diabetes  Weight Management/Obesity;Hypertension;Diabetes  Weight Management/Obesity;Hypertension;Diabetes;Lipids     Review  Mike Ronalee Beltsoing well at home.  His weight stays between 215-218lbs.  Overall, his blood sugars have been good and he has not noticed any lows in his system.  Blood pressures have been good and he checks them daily at home in the morning along with his sugars. He is doing well on his medications.  Mike Ronalee Beltsinues to do well.  His weight is 227-230 lbs recently and seems to be related to his fluid  levels which flucuate greatly.  He is doing his best to stay on top of it, he also drinks a lot of water.  Sugars have been good and stay around 110 mg/Dl on average.  Today he was 93 fasting.   His blood pressures have been in the 120s-130s/70s.  He has been watching his sodium to help with control.  He has also uses our YouTube channel for exercise and education.  He picks up food alot for meals for him and his wife. He cooks sometimes but is eating everyting. Rawn checks his sugar at home and it has been stable. He states his lipids have been better. Blood pressure has been normal at home when he checks.     Expected Outcomes  Short: Continue to work on weight loss.  Long: Continue to manage diabetes.  Short: Continue to work on weight loss.  Long: Continue to manage diabetes.  Short: lose 5 pounds. Long: lose more weight with  diet and exercise.        Exercise Goals and Review: Exercise Goals    Row Name 05/07/19 1655             Exercise Goals   Increase Physical Activity  Yes       Intervention  Provide advice, education, support and counseling about physical activity/exercise needs.;Develop an individualized exercise prescription for aerobic and resistive  training based on initial evaluation findings, risk stratification, comorbidities and participant's personal goals.       Expected Outcomes  Short Term: Attend rehab on a regular basis to increase amount of physical activity.;Long Term: Add in home exercise to make exercise part of routine and to increase amount of physical activity.;Long Term: Exercising regularly at least 3-5 days a week.       Increase Strength and Stamina  Yes       Intervention  Provide advice, education, support and counseling about physical activity/exercise needs.;Develop an individualized exercise prescription for aerobic and resistive training based on initial evaluation findings, risk stratification, comorbidities and participant's personal goals.        Expected Outcomes  Short Term: Increase workloads from initial exercise prescription for resistance, speed, and METs.;Short Term: Perform resistance training exercises routinely during rehab and add in resistance training at home;Long Term: Improve cardiorespiratory fitness, muscular endurance and strength as measured by increased METs and functional capacity (6MWT)       Able to understand and use rate of perceived exertion (RPE) scale  Yes       Intervention  Provide education and explanation on how to use RPE scale       Expected Outcomes  Short Term: Able to use RPE daily in rehab to express subjective intensity level;Long Term:  Able to use RPE to guide intensity level when exercising independently       Able to understand and use Dyspnea scale  Yes       Intervention  Provide education and explanation on how to use Dyspnea scale       Expected Outcomes  Short Term: Able to use Dyspnea scale daily in rehab to express subjective sense of shortness of breath during exertion;Long Term: Able to use Dyspnea scale to guide intensity level when exercising independently       Knowledge and understanding of Target Heart Rate Range (THRR)  Yes       Intervention  Provide education and explanation of THRR including how the numbers were predicted and where they are located for reference       Expected Outcomes  Short Term: Able to use daily as guideline for intensity in rehab;Short Term: Able to state/look up THRR;Long Term: Able to use THRR to govern intensity when exercising independently       Able to check pulse independently  Yes       Intervention  Review the importance of being able to check your own pulse for safety during independent exercise;Provide education and demonstration on how to check pulse in carotid and radial arteries.       Expected Outcomes  Short Term: Able to explain why pulse checking is important during independent exercise;Long Term: Able to check pulse independently and accurately        Understanding of Exercise Prescription  Yes       Intervention  Provide education, explanation, and written materials on patient's individual exercise prescription       Expected Outcomes  Short Term: Able to explain program exercise prescription;Long Term: Able to explain home exercise prescription to exercise independently          Exercise Goals Re-Evaluation: Exercise Goals Re-Evaluation    Row Name 05/13/19 1456 05/21/19 0918 05/27/19 1410 06/03/19 1152 06/17/19 1406     Exercise Goal Re-Evaluation   Exercise Goals Review  Able to understand and use rate of perceived exertion (RPE) scale;Knowledge and understanding of Target Heart Rate Range (THRR);Understanding  of Exercise Prescription  Increase Strength and Stamina;Increase Physical Activity;Understanding of Exercise Prescription  Increase Strength and Stamina;Increase Physical Activity;Understanding of Exercise Prescription  Increase Physical Activity;Increase Strength and Stamina;Understanding of Exercise Prescription  Increase Physical Activity;Increase Strength and Stamina;Understanding of Exercise Prescription   Comments  Reviewed RPE scale, THR and program prescription with pt today.  Pt voiced understanding and was given a copy of goals to take home.  [name] is off to a good start in rehab. He has completed three full days of exercise.  He is already up to 2.8 METs on the XR and getting into the routine.  We will continue to montior his progression.  [name] is doing well in rehab.  He is starting to feel better and is staying active at home. Reviewed home exercise with pt today.  Pt plans to walking at home for exercise.  Reviewed THR, pulse, RPE, sign and symptoms, NTG use, and when to call 911 or MD.  Also discussed weather considerations and indoor options.  Pt voiced understanding.  [name] is off to a good start in rehab.  He is now able to complete all 30 min each day without stopping.  He is up to 4.9 METs on the XR.  We will continue  to monitor his progress.  [name] is doing well at home. He is walking at home every day either 2 or 3 times a day.  He usually averages about 30 min or 15 min is the rain is better.  He is doing well with his stamina. but his strength is not where he wants it to be and would to increase.  He is coming off his steriords to prepare for his next dose in the spine.   Expected Outcomes  Short: Use RPE daily to regulate intensity. Long: Follow program prescription in THR.  Short: Continue to attend regularly.  Long: Continue follow program prescription.  Short: Continue to walk daily.  Long: Continue to increase strength.  Short: Continue to attend regularly.  Long: Continue to increase stamina.  Short: Continue to walk daily.  Long: Continue to increase stamina.   [place] Name 06/26/19 1541 07/09/19 1439 07/15/19 1407 07/25/19 1423       Exercise Goal Re-Evaluation   Exercise Goals Review  Increase Physical Activity;Increase Strength and Stamina;Understanding of Exercise Prescription  Increase Physical Activity;Increase Strength and Stamina;Understanding of Exercise Prescription  Increase Physical Activity;Increase Strength and Stamina  Increase Physical Activity;Increase Strength and Stamina;Able to understand and use rate of perceived exertion (RPE) scale;Knowledge and understanding of Target Heart Rate Range (THRR);Able to check pulse independently;Understanding of Exercise Prescription    Comments  [name] had a spinal injection this week and hopes it will help with his back pain.  [name] was able to return to exercise last week.  He was able to increase both the XR and T5 NuStep last week!!  We will continue to monitor his progress.  He wants to exercise at home but his back hurts him too much. He is on Plavix and he cannot get his back worked on. He is able to walk about a quarter mile a day.His rehab is going well but his back and neck are always hurting.  [name] has been able to increase levels on most machines.  His  overall MET level has improved    Expected Outcomes  Short: Return to rehab. Long: Continue to walk on off days.  Short: Continue to increase Long: Walk more on off days!  Short: attend [facility] to  improve pain. Long: finish [facility] and continue to exercise independently.  Short - conintue to attend consistently Long - maintain exercise on his own       Nutrition & Weight - Outcomes: Pre Biometrics - 05/07/19 1656      Pre Biometrics   Height  5' 11.25" (1.81 m)    Weight  230 lb (104.3 kg)    BMI (Calculated)  31.84        Nutrition: Nutrition Therapy & Goals - 05/07/19 1639      Nutrition Therapy   Diet  Low Na, HH, diabetic diet    Protein (specify units)  85g    Fiber  25 grams    Whole Grain Foods  3 servings    Saturated Fats  12 max. grams    Fruits and Vegetables  5 servings/day    Sodium  1.5 grams      Personal Nutrition Goals   Nutrition Goal  ST: stop eating take out burgers (talked about switching to chicken at [employer]) LT: increase stamina    Comments  Pt reports eating fast food for almost every meal due to wife not wanting healthy meals. Pt reports loving green leafy vegetables and fruit. Pt will make homemade sorbet at night. Discussed HH eating, MyPlate, low Na, healthy snacking, BG control, and suggested meal prepping or cooking foods and eating something different from his wife. Pt reports eating fruit at work (catelope and watermelon). Pt does not have teeth but can eat somethings well. possible goal to eat 2 snacks during the day to help maintain BG. Pt reports that his doctor said he isn't needed enough- BG may be low. On metformin. pt does his best to choose healthy options when eating out such as vegetables, taking skin of chicken, etc.      Intervention Plan   Intervention  Prescribe, educate and counsel regarding individualized specific dietary modifications aiming towards targeted core components such as weight, hypertension, lipid management,  diabetes, heart failure and other comorbidities.;Nutrition handout(s) given to patient.    Expected Outcomes  Short Term Goal: Understand basic principles of dietary content, such as calories, fat, sodium, cholesterol and nutrients.;Short Term Goal: A plan has been developed with personal nutrition goals set during dietitian appointment.;Long Term Goal: Adherence to prescribed nutrition plan.       Nutrition Discharge:   Education Questionnaire Score:   Goals reviewed with patient; copy given to patient.

## 2019-08-12 NOTE — Progress Notes (Signed)
Cardiac Individual Treatment Plan  Patient Details  Name: Kent [name] MRN: [id number] Date of Birth: 01/20/1956 Referring Provider:     Cardiac Rehab from 05/07/2019 in [facility] Cardiac and Pulmonary Rehab  Referring Provider  Kent Cowman MD      Initial Encounter Date:    Cardiac Rehab from 05/07/2019 in [facility] Cardiac and Pulmonary Rehab  Date  05/07/19      Visit Diagnosis: NSTEMI (non-ST elevation myocardial infarction) [facility])  Status post coronary artery stent placement  Patient's Home Medications on Admission:  Current Outpatient Medications:  .  ALPRAZolam (XANAX) 0.25 MG tablet, Take 0.125 mg by mouth 3 (three) times daily as needed for anxiety., Disp: , Rfl:  .  aspirin EC 81 MG tablet, Take 81 mg by mouth at bedtime., Disp: , Rfl:  .  atorvastatin (LIPITOR) 80 MG tablet, Take 1 tablet (80 mg total) by mouth daily., Disp: 30 tablet, Rfl: 11 .  carvedilol (COREG) 25 MG tablet, Take 25 mg by mouth 4 (four) times daily., Disp: , Rfl:  .  clopidogrel (PLAVIX) 75 MG tablet, Take 75 mg by mouth daily., Disp: , Rfl:  .  ferrous sulfate 325 (65 FE) MG tablet, Take 325 mg by mouth 2 (two) times daily with a meal., Disp: , Rfl:  .  gabapentin (NEURONTIN) 300 MG capsule, Take 300 mg by mouth See admin instructions. Take 300 mg by mouth at supper and take 300 mg by mouth at bedtime, Disp: , Rfl:  .  HYDROcodone-acetaminophen (NORCO) 7.5-325 MG tablet, Take 0.5 tablets by mouth 3 (three) times daily as needed for moderate pain., Disp: , Rfl:  .  ibuprofen (ADVIL,MOTRIN) 200 MG tablet, Take 200-400 mg by mouth See admin instructions. Take 400 mg by mouth in the morning, take 200 mg by mouth at lunch and take 200 mg by mouth at bedtime, Disp: , Rfl:  .  isosorbide mononitrate (IMDUR) 30 MG 24 hr tablet, Take 30 mg by mouth daily., Disp: , Rfl:  .  lisinopril (PRINIVIL,ZESTRIL) 20 MG tablet, Take 20 mg by mouth daily., Disp: , Rfl:  .  Magnesium 500 MG TABS, Take 500 mg by mouth  daily., Disp: , Rfl:  .  Melatonin 10 MG CAPS, Take 10 mg by mouth at bedtime., Disp: , Rfl:  .  metFORMIN (GLUCOPHAGE) 500 MG tablet, Take 500 mg by mouth 3 (three) times daily., Disp: , Rfl:  .  methocarbamol (ROBAXIN) 500 MG tablet, Take 500 mg by mouth at bedtime., Disp: , Rfl:  .  Multiple Vitamins-Minerals (CENTRUM ADULTS PO), Take 1 tablet by mouth daily., Disp: , Rfl:  .  nitroGLYCERIN (NITROSTAT) 0.4 MG SL tablet, Place 0.4 mg under the tongue every 5 (five) minutes as needed for chest pain., Disp: , Rfl:  .  nitroGLYCERIN (NITROSTAT) 0.4 MG SL tablet, Place 1 tablet (0.4 mg total) under the tongue every 5 (five) minutes as needed for chest pain., Disp: 100 tablet, Rfl: 3 .  omeprazole (PRILOSEC) 20 MG capsule, Take 20 mg by mouth daily., Disp: , Rfl:  .  PARoxetine (PAXIL) 20 MG tablet, Take 20 mg by mouth daily., Disp: , Rfl:  .  vitamin C (ASCORBIC ACID) 250 MG tablet, Take 250 mg by mouth daily. Not sure of dose   2 gummies a day, Disp: , Rfl:   Past Medical History: Past Medical History:  Diagnosis Date  . Anemia   . Cardiovascular disease   . Diabetes mellitus ([place])    Type II  .  Hypertension   . Neuropathy due to secondary diabetes mellitus ([place])   . Osteoarthritis     Tobacco Use: Social History   Tobacco Use  Smoking Status Former Smoker  Smokeless Tobacco Never Used    Labs: Recent Review [facility] Cardiac and Pulmonary Rehab Latest Ref Rng & Units 03/30/2013   Cholestrol 0 - 200 mg/dL 124   LDLCALC 0 - 100 mg/dL 60   HDL 40 - 60 mg/dL 41   Trlycerides 0 - 200 mg/dL 115       Exercise Target Goals: Exercise Program Goal: Individual exercise prescription set using results from initial 6 min walk test and THRR while considering  patient's activity barriers and safety.   Exercise Prescription Goal: Initial exercise prescription builds to 30-45 minutes a day of aerobic activity, 2-3 days per week.  Home exercise guidelines will be  given to patient during program as part of exercise prescription that the participant will acknowledge.  Activity Barriers & Risk Stratification: Activity Barriers & Cardiac Risk Stratification - 05/07/19 1650      Activity Barriers & Cardiac Risk Stratification   Activity Barriers  Deconditioning;Muscular Weakness;Balance Concerns;History of Falls;Shortness of Breath;Assistive Device    Comments  Both legs rebuilt from knees down  cannot use the treadmill    Cardiac Risk Stratification  High       6 Minute Walk: 6 Minute Walk    Row Name 05/07/19 1645         6 Minute Walk   Phase  Initial     Distance  1320 feet     Walk Time  6 minutes     # of Rest Breaks  0     MPH  2.5     METS  3.24     RPE  12     Perceived Dyspnea   1     VO2 Peak  11.34     Symptoms  Yes (comment)     Comments  fatigue and foot drag     Resting HR  61 bpm     Resting BP  146/74     Resting Oxygen Saturation   98 %     Exercise Oxygen Saturation  during 6 min walk  96 %     Max Ex. HR  107 bpm     Max Ex. BP  154/76     2 Minute Post BP  134/62        Oxygen Initial Assessment:   Oxygen Re-Evaluation:   Oxygen Discharge (Final Oxygen Re-Evaluation):   Initial Exercise Prescription: Initial Exercise Prescription - 05/07/19 1600      Date of Initial Exercise RX and Referring Provider   Date  05/07/19    Referring Provider  Kent Cowman MD      Recumbant Bike   Level  3    RPM  50    Watts  41    Minutes  15    METs  3      NuStep   Level  2    SPM  80    Minutes  15    METs  2.5      Arm Ergometer   Level  3    Watts  58    RPM  30    Minutes  15    METs  2.5      Recumbant Elliptical   Level  1    RPM  50    Minutes  15    METs  2.5      T5 Nustep   Level  2    SPM  80    Minutes  15    METs  2.5      Prescription Details   Frequency (times per week)  2    Duration  Progress to 30 minutes of continuous aerobic without signs/symptoms of physical  distress      Intensity   THRR 40-80% of Max Heartrate  99-136    Ratings of Perceived Exertion  11-13    Perceived Dyspnea  0-4      Progression   Progression  Continue to progress workloads to maintain intensity without signs/symptoms of physical distress.      Resistance Training   Training Prescription  Yes    Weight  3 lbs    Reps  10-15       Perform Capillary Blood Glucose checks as needed.  Exercise Prescription Changes: Exercise Prescription Changes    Row Name 05/21/19 1000 05/27/19 1400 06/03/19 1100 06/20/19 1500 06/26/19 1500     Response to Exercise   Blood Pressure (Admit)  138/82  -  126/70  122/74  124/70   Blood Pressure (Exercise)  142/80  -  140/76  140/76  150/80   Blood Pressure (Exit)  122/56  -  120/68  132/78  130/76   Heart Rate (Admit)  63 bpm  -  84 bpm  65 bpm  70 bpm   Heart Rate (Exercise)  93 bpm  -  94 bpm  103 bpm  92 bpm   Heart Rate (Exit)  80 bpm  -  76 bpm  73 bpm  83 bpm   Rating of Perceived Exertion (Exercise)  12  -  _0 Symptoms  none  -  none  none  none   Duration  Continue with 30 min of aerobic exercise without signs/symptoms of physical distress.  -  Continue with 30 min of aerobic exercise without signs/symptoms of physical distress.  Continue with 30 min of aerobic exercise without signs/symptoms of physical distress.  Continue with 30 min of aerobic exercise without signs/symptoms of physical distress.   Intensity  THRR unchanged  -  THRR unchanged  THRR unchanged  THRR unchanged     Progression   Progression  Continue to progress workloads to maintain intensity without signs/symptoms of physical distress.  -  Continue to progress workloads to maintain intensity without signs/symptoms of physical distress.  Continue to progress workloads to maintain intensity without signs/symptoms of physical distress.  Continue to progress workloads to maintain intensity without signs/symptoms of physical distress.   Average METs  3.37   -  3.4  3.37  3.25     Resistance Training   Training Prescription  Yes  -  Yes  Yes  Yes   Weight  4 lbs  -  4 lbs  4 lbs  5 lbs   Reps  10-15  -  10-15  10-15  10-15     Interval Training   Interval Training  No  -  No  No  No     NuStep   Level  -  -  -  2  -   Minutes  -  -  -  15  -   METs  -  -  -  4  -     Arm Ergometer  Level  3  -  3  -  3   Minutes  15  -  15  -  15   METs  2.3  -  2.5  -  2.7     Recumbant Elliptical   Level  -  -  -  2  -   Minutes  -  -  -  15  -   METs  -  -  -  1.8  -     REL-XR   Level  2  -  _0 Minutes  15  -  _1 METs  2.8  -  4.9  5  4.8     T5 Nustep   Level  2  -  _2 Minutes  15  -  _3 METs  2  -  2.2  2.3  2.5     Biostep-RELP   Level  3  -  4  -  4   Minutes  15  -  15  -  15   METs  3  -  4  -  3     Home Exercise Plan   Plans to continue exercise at  -  Home (comment) walking  Home (comment) walking  Home (comment) walking  Home (comment) walking   Frequency  -  Add 3 additional days to program exercise sessions.  Add 3 additional days to program exercise sessions.  Add 3 additional days to program exercise sessions.  Add 3 additional days to program exercise sessions.   Initial Home Exercises Provided  -  05/27/19  05/27/19  05/27/19  05/27/19   Row Name 07/09/19 1400 07/25/19 1400           Response to Exercise   Blood Pressure (Admit)  112/70  128/68      Blood Pressure (Exercise)  128/80  144/60      Blood Pressure (Exit)  122/68  120/66      Heart Rate (Admit)  82 bpm  62 bpm      Heart Rate (Exercise)  102 bpm  123 bpm      Heart Rate (Exit)  67 bpm  72 bpm      Rating of Perceived Exertion (Exercise)  13  14      Symptoms  none  none      Duration  Continue with 30 min of aerobic exercise without signs/symptoms of physical distress.  Continue with 30 min of aerobic exercise without signs/symptoms of physical distress.      Intensity  THRR unchanged  THRR unchanged         Progression   Progression  Continue to progress workloads to maintain intensity without signs/symptoms of physical distress.  Continue to progress workloads to maintain intensity without signs/symptoms of physical distress.      Average METs  3.6  3.6        Resistance Training   Training Prescription  Yes  Yes      Weight  5 lbs  5 lb      Reps  10-15  10-15        Interval Training   Interval Training  No  No        Arm Ergometer   Level  3  4      Minutes  15  15  METs  2.6  3.1        REL-XR   Level  6  6      Minutes  15  15      METs  5.5  5.5        T5 Nustep   Level  4  4      Minutes  15  15      METs  2.3  2.4        Biostep-RELP   Level  4  5      Minutes  15  15      METs  4  4        Home Exercise Plan   Plans to continue exercise at  Home (comment) walking  -      Frequency  Add 3 additional days to program exercise sessions.  -      Initial Home Exercises Provided  05/27/19  -         Exercise Comments: Exercise Comments    Row Name 05/13/19 1455 07/01/19 1358         Exercise Comments  First full day of exercise!  Patient was oriented to gym and equipment including functions, settings, policies, and procedures.  Patient's individual exercise prescription and treatment plan were reviewed.  All starting workloads were established based on the results of the 6 minute walk test done at initial orientation visit.  The plan for exercise progression was also introduced and progression will be customized based on patient's performance and goals  Mr. Mccaslin brought a MD clearance letter post procedure stating he can exercise.         Exercise Goals and Review: Exercise Goals    Row Name 05/07/19 1655             Exercise Goals   Increase Physical Activity  Yes       Intervention  Provide advice, education, support and counseling about physical activity/exercise needs.;Develop an individualized exercise prescription for aerobic and resistive training  based on initial evaluation findings, risk stratification, comorbidities and participant's personal goals.       Expected Outcomes  Short Term: Attend rehab on a regular basis to increase amount of physical activity.;Long Term: Add in home exercise to make exercise part of routine and to increase amount of physical activity.;Long Term: Exercising regularly at least 3-5 days a week.       Increase Strength and Stamina  Yes       Intervention  Provide advice, education, support and counseling about physical activity/exercise needs.;Develop an individualized exercise prescription for aerobic and resistive training based on initial evaluation findings, risk stratification, comorbidities and participant's personal goals.       Expected Outcomes  Short Term: Increase workloads from initial exercise prescription for resistance, speed, and METs.;Short Term: Perform resistance training exercises routinely during rehab and add in resistance training at home;Long Term: Improve cardiorespiratory fitness, muscular endurance and strength as measured by increased METs and functional capacity (6MWT)       Able to understand and use rate of perceived exertion (RPE) scale  Yes       Intervention  Provide education and explanation on how to use RPE scale       Expected Outcomes  Short Term: Able to use RPE daily in rehab to express subjective intensity level;Long Term:  Able to use RPE to guide intensity level when exercising independently       Able to understand and  use Dyspnea scale  Yes       Intervention  Provide education and explanation on how to use Dyspnea scale       Expected Outcomes  Short Term: Able to use Dyspnea scale daily in rehab to express subjective sense of shortness of breath during exertion;Long Term: Able to use Dyspnea scale to guide intensity level when exercising independently       Knowledge and understanding of Target Heart Rate Range (THRR)  Yes       Intervention  Provide education and  explanation of THRR including how the numbers were predicted and where they are located for reference       Expected Outcomes  Short Term: Able to use daily as guideline for intensity in rehab;Short Term: Able to state/look up THRR;Long Term: Able to use THRR to govern intensity when exercising independently       Able to check pulse independently  Yes       Intervention  Review the importance of being able to check your own pulse for safety during independent exercise;Provide education and demonstration on how to check pulse in carotid and radial arteries.       Expected Outcomes  Short Term: Able to explain why pulse checking is important during independent exercise;Long Term: Able to check pulse independently and accurately       Understanding of Exercise Prescription  Yes       Intervention  Provide education, explanation, and written materials on patient's individual exercise prescription       Expected Outcomes  Short Term: Able to explain program exercise prescription;Long Term: Able to explain home exercise prescription to exercise independently          Exercise Goals Re-Evaluation : Exercise Goals Re-Evaluation    Row Name 05/13/19 1456 05/21/19 0918 05/27/19 1410 06/03/19 1152 06/17/19 1406     Exercise Goal Re-Evaluation   Exercise Goals Review  Able to understand and use rate of perceived exertion (RPE) scale;Knowledge and understanding of Target Heart Rate Range (THRR);Understanding of Exercise Prescription  Increase Strength and Stamina;Increase Physical Activity;Understanding of Exercise Prescription  Increase Strength and Stamina;Increase Physical Activity;Understanding of Exercise Prescription  Increase Physical Activity;Increase Strength and Stamina;Understanding of Exercise Prescription  Increase Physical Activity;Increase Strength and Stamina;Understanding of Exercise Prescription   Comments  Reviewed RPE scale, THR and program prescription with pt today.  Pt voiced  understanding and was given a copy of goals to take home.  Kent [name] is off to a good start in rehab. He has completed three full days of exercise.  He is already up to 2.8 METs on the XR and getting into the routine.  We will continue to montior his progression.  Kent [name] is doing well in rehab.  He is starting to feel better and is staying active at home. Reviewed home exercise with pt today.  Pt plans to walking at home for exercise.  Reviewed THR, pulse, RPE, sign and symptoms, NTG use, and when to call 911 or MD.  Also discussed weather considerations and indoor options.  Pt voiced understanding.  Kent [name] is off to a good start in rehab.  He is now able to complete all 30 min each day without stopping.  He is up to 4.9 METs on the XR.  We will continue to monitor his progress.  Kent [name] is doing well at home. He is walking at home every day either 2 or 3 times a day.  He usually averages about 30 min or 15 min  is the rain is better.  He is doing well with his stamina. but his strength is not where he wants it to be and would to increase.  He is coming off his steriords to prepare for his next dose in the spine.   Expected Outcomes  Short: Use RPE daily to regulate intensity. Long: Follow program prescription in THR.  Short: Continue to attend regularly.  Long: Continue follow program prescription.  Short: Continue to walk daily.  Long: Continue to increase strength.  Short: Continue to attend regularly.  Long: Continue to increase stamina.  Short: Continue to walk daily.  Long: Continue to increase stamina.   [place] Name 06/26/19 1541 07/09/19 1439 07/15/19 1407 07/25/19 1423       Exercise Goal Re-Evaluation   Exercise Goals Review  Increase Physical Activity;Increase Strength and Stamina;Understanding of Exercise Prescription  Increase Physical Activity;Increase Strength and Stamina;Understanding of Exercise Prescription  Increase Physical Activity;Increase Strength and Stamina  Increase Physical Activity;Increase  Strength and Stamina;Able to understand and use rate of perceived exertion (RPE) scale;Knowledge and understanding of Target Heart Rate Range (THRR);Able to check pulse independently;Understanding of Exercise Prescription    Comments  Kent [name] had a spinal injection this week and hopes it will help with his back pain.  Kent [name] was able to return to exercise last week.  He was able to increase both the XR and T5 NuStep last week!!  We will continue to monitor his progress.  He wants to exercise at home but his back hurts him too much. He is on Plavix and he cannot get his back worked on. He is able to walk about a quarter mile a day.His rehab is going well but his back and neck are always hurting.  Kent [name] has been able to increase levels on most machines.  His overall MET level has improved    Expected Outcomes  Short: Return to rehab. Long: Continue to walk on off days.  Short: Continue to increase Long: Walk more on off days!  Short: attend [facility] to improve pain. Long: finish [facility] and continue to exercise independently.  Short - conintue to attend consistently Long - maintain exercise on his own       Discharge Exercise Prescription (Final Exercise Prescription Changes): Exercise Prescription Changes - 07/25/19 1400      Response to Exercise   Blood Pressure (Admit)  128/68    Blood Pressure (Exercise)  144/60    Blood Pressure (Exit)  120/66    Heart Rate (Admit)  62 bpm    Heart Rate (Exercise)  123 bpm    Heart Rate (Exit)  72 bpm    Rating of Perceived Exertion (Exercise)  14    Symptoms  none    Duration  Continue with 30 min of aerobic exercise without signs/symptoms of physical distress.    Intensity  THRR unchanged      Progression   Progression  Continue to progress workloads to maintain intensity without signs/symptoms of physical distress.    Average METs  3.6      Resistance Training   Training Prescription  Yes    Weight  5 lb    Reps  10-15      Interval Training    Interval Training  No      Arm Ergometer   Level  4    Minutes  15    METs  3.1      REL-XR   Level  6    Minutes  15  METs  5.5      T5 Nustep   Level  4    Minutes  15    METs  2.4      Biostep-RELP   Level  5    Minutes  15    METs  4       Nutrition:  Target Goals: Understanding of nutrition guidelines, daily intake of sodium <155m, cholesterol <2057m calories 30% from fat and 7% or less from saturated fats, daily to have 5 or more servings of fruits and vegetables.  Biometrics: Pre Biometrics - 05/07/19 1656      Pre Biometrics   Height  5' 11.25" (1.81 m)    Weight  230 lb (104.3 kg)    BMI (Calculated)  31.84        Nutrition Therapy Plan and Nutrition Goals: Nutrition Therapy & Goals - 05/07/19 1639      Nutrition Therapy   Diet  Low Na, HH, diabetic diet    Protein (specify units)  85g    Fiber  25 grams    Whole Grain Foods  3 servings    Saturated Fats  12 max. grams    Fruits and Vegetables  5 servings/day    Sodium  1.5 grams      Personal Nutrition Goals   Nutrition Goal  ST: stop eating take out burgers (talked about switching to chicken at [employer]) LT: increase stamina    Comments  Pt reports eating fast food for almost every meal due to wife not wanting healthy meals. Pt reports loving green leafy vegetables and fruit. Pt will make homemade sorbet at night. Discussed HH eating, MyPlate, low Na, healthy snacking, BG control, and suggested meal prepping or cooking foods and eating something different from his wife. Pt reports eating fruit at work (catelope and watermelon). Pt does not have teeth but can eat somethings well. possible goal to eat 2 snacks during the day to help maintain BG. Pt reports that his doctor said he isn't needed enough- BG may be low. On metformin. pt does his best to choose healthy options when eating out such as vegetables, taking skin of chicken, etc.      Intervention Plan   Intervention  Prescribe, educate and  counsel regarding individualized specific dietary modifications aiming towards targeted core components such as weight, hypertension, lipid management, diabetes, heart failure and other comorbidities.;Nutrition handout(s) given to patient.    Expected Outcomes  Short Term Goal: Understand basic principles of dietary content, such as calories, fat, sodium, cholesterol and nutrients.;Short Term Goal: A plan has been developed with personal nutrition goals set during dietitian appointment.;Long Term Goal: Adherence to prescribed nutrition plan.       Nutrition Assessments:   Nutrition Goals Re-Evaluation: Nutrition Goals Re-Evaluation    [place] 05/15/19 1516 06/10/19 1416 07/03/19 1413 07/17/19 1408       Goals   Current Weight  -  -  -  236 lb 6.4 oz (107.2 kg)    Nutrition Goal  ST: stop eating take out burgers (talked about switching to chicken at [employer]) LT: increase stamina  ST: try best when eating out to choose healthy options LT: increase stamina  ST: try best when eating out to choose healthy options, monitor energy levels LT: increase stamina  ST: try best when eating out to choose healthy options, monitor energy levels LT: increase stamina    Comment  Talked to pt again about whole grains and fiber and BG, reiterated that  pt DR may say that he isn't eating enough due to Bg being low at times, talked about eating consistenly and good food choices to maintain BG. Pt reports still eating the best he can; eating lots of vegetables choosing chicken without skin, but will have white rice as he doesn't like brown rice.  Talked to pt again about whole grains and fiber and BG, as well as general healthy choices; discussed on the go healthy options, pt was not interested and insists he will continue with take out for lunch especially since his wife is very picky. Pt reports still eating the best he can when he cooks; eating lots of vegetables choosing chicken without skin, but will have white rice  as he doesn't like brown rice.  Talked to pt again about whole grains and fiber and BG, as well as general healthy choices;  Pt reports still eating the best he can when he cooks; eating lots of vegetables choosing chicken without skin, but will have white rice as he doesn't like brown rice. Pt reports energy levels down discussed dietary changes he could make to help with that such as adding protein or fat to B (cheerios). Pt reports he thinks his mediction, but is also not sleeping due to being a light sleeper. Discussed sleep hygiene techniques.  Talked to pt again, pt reports choosing general healthy choices; pt rpeort he has 3lbs of fluid on today but has not had any changes in his meals. Discussed the role of salt. Pt reports doing well with his nutrition and wants to continue what he is doing and not make any more changes. pt is still not sleeping well.    Expected Outcome  switch to chicken instead of burgers when getting takeout LT: increase stamina, maintain BG  ST: try best when eating out to choose healthy options LT: increase stamina  ST: try best when eating out to choose healthy options, monitor energy levels LT: increase stamina  ST: try best when eating out to choose healthy options, monitor energy levels LT: increase stamina       Nutrition Goals Discharge (Final Nutrition Goals Re-Evaluation): Nutrition Goals Re-Evaluation - 07/17/19 1408      Goals   Current Weight  236 lb 6.4 oz (107.2 kg)    Nutrition Goal  ST: try best when eating out to choose healthy options, monitor energy levels LT: increase stamina    Comment  Talked to pt again, pt reports choosing general healthy choices; pt rpeort he has 3lbs of fluid on today but has not had any changes in his meals. Discussed the role of salt. Pt reports doing well with his nutrition and wants to continue what he is doing and not make any more changes. pt is still not sleeping well.    Expected Outcome  ST: try best when eating out to  choose healthy options, monitor energy levels LT: increase stamina       Psychosocial: Target Goals: Acknowledge presence or absence of significant depression and/or stress, maximize coping skills, provide positive support system. Participant is able to verbalize types and ability to use techniques and skills needed for reducing stress and depression.   Initial Review & Psychosocial Screening:   Quality of Life Scores:   Scores of 19 and below usually indicate a poorer quality of life in these areas.  A difference of  2-3 points is a clinically meaningful difference.  A difference of 2-3 points in the total score of the Quality of Life  Index has been associated with significant improvement in overall quality of life, self-image, physical symptoms, and general health in studies assessing change in quality of life.  PHQ-9: Recent Review Flowsheet Data    There is no flowsheet data to display.     Interpretation of Total Score  Total Score Depression Severity:  1-4 = Minimal depression, 5-9 = Mild depression, 10-14 = Moderate depression, 15-19 = Moderately severe depression, 20-27 = Severe depression   Psychosocial Evaluation and Intervention:   Psychosocial Re-Evaluation: Psychosocial Re-Evaluation    [place] Name 05/27/19 1415 06/17/19 1408 07/15/19 1413         Psychosocial Re-Evaluation   Current issues with  Current Stress Concerns  Current Stress Concerns  Current Sleep Concerns;Current Depression;Current Stress Concerns;Current Anxiety/Panic;Current Psychotropic Meds     Comments  Kent [name] is doing well in rehab.  He walks with his dog daily.  He does not let stress get to him and stays positive overall.  He sleeps good and is compliant with his CPAP.  Kent [name] has been doing well in rehab.  He continues to walk daily.  Overall, he is staying positive and stress is bearable. 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  He wants to retire but afraid to do it due to finances.   Still sleeps good and compliant with CPAP.  He uses his mask regulalry and they make him frustrated.  He has been treated for depression for 40 years and anxiety since his heart surgery. He has married three times, had open heart surgery when he was 45. 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 He prays and tries to cope with his depression.     Expected Outcomes  Short: Continue to practice self care.  Long: Continue to stay positive.  Short: Continue to walk and self care to stay postive.  Long: Continue to consider retirement.  Short: continue taking medication to help with his depression and anxiety. Long: maintain exercise to keep stress at a minimum.     Interventions  Encouraged to attend Cardiac Rehabilitation for the exercise  Encouraged to attend Cardiac Rehabilitation for the exercise  Encouraged to attend Cardiac Rehabilitation for the exercise     Continue Psychosocial Services   Follow up required by staff  Follow up required by staff  Follow up required by staff        Psychosocial Discharge (Final Psychosocial Re-Evaluation): Psychosocial Re-Evaluation - 07/15/19 1413      Psychosocial Re-Evaluation   Current issues with  Current Sleep Concerns;Current Depression;Current Stress Concerns;Current Anxiety/Panic;Current Psychotropic Meds    Comments  He has been treated for depression for 40 years and anxiety since his heart surgery. He has married three times, had open heart surgery when he was 77. 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 He prays and tries to cope with his depression.    Expected Outcomes  Short: continue taking medication to help with his depression and anxiety. Long: maintain exercise to keep stress at a minimum.    Interventions  Encouraged to attend Cardiac Rehabilitation for the exercise    Continue  Psychosocial Services   Follow up required by staff       Vocational Rehabilitation: Provide vocational rehab assistance to qualifying candidates.   Vocational  Rehab Evaluation & Intervention: Vocational Rehab - 04/29/19 1434      Initial Vocational Rehab Evaluation & Intervention   Assessment shows need for Vocational Rehabilitation  No       Education: Education Goals: Education classes will be provided on a variety of topics geared toward better understanding of heart health and risk factor modification. Participant will state understanding/return demonstration of topics presented as noted by education test scores.  Learning Barriers/Preferences:   Education Topics:  AED/CPR: - Group verbal and written instruction with the use of models to demonstrate the basic use of the AED with the basic ABC's of resuscitation.   General Nutrition Guidelines/Fats and Fiber: -Group instruction provided by verbal, written material, models and posters to present the general guidelines for heart healthy nutrition. Gives an explanation and review of dietary fats and fiber.   Controlling Sodium/Reading Food Labels: -Group verbal and written material supporting the discussion of sodium use in heart healthy nutrition. Review and explanation with models, verbal and written materials for utilization of the food label.   Exercise Physiology & General Exercise Guidelines: - Group verbal and written instruction with models to review the exercise physiology of the cardiovascular system and associated critical values. Provides general exercise guidelines with specific guidelines to those with heart or lung disease.    Aerobic Exercise & Resistance Training: - Gives group verbal and written instruction on the various components of exercise. Focuses on aerobic and resistive training programs and the benefits of this training and how to safely progress through these programs..   Flexibility, Balance,  Mind/Body Relaxation: Provides group verbal/written instruction on the benefits of flexibility and balance training, including mind/body exercise modes such as yoga, pilates and tai chi.  Demonstration and skill practice provided.   Stress and Anxiety: - Provides group verbal and written instruction about the health risks of elevated stress and causes of high stress.  Discuss the correlation between heart/lung disease and anxiety and treatment options. Review healthy ways to manage with stress and anxiety.   Depression: - Provides group verbal and written instruction on the correlation between heart/lung disease and depressed mood, treatment options, and the stigmas associated with seeking treatment.   Anatomy & Physiology of the Heart: - Group verbal and written instruction and models provide basic cardiac anatomy and physiology, with the coronary electrical and arterial systems. Review of Valvular disease and Heart Failure   Cardiac Procedures: - Group verbal and written instruction to review commonly prescribed medications for heart disease. Reviews the medication, class of the drug, and side effects. Includes the steps to properly store meds and maintain the prescription regimen. (beta blockers and nitrates)   Cardiac Medications I: - Group verbal and written instruction to review commonly prescribed medications for heart disease. Reviews the medication, class of the drug, and side effects. Includes the steps to properly store meds and maintain the prescription regimen.   Cardiac Medications II: -Group verbal and written instruction to review commonly prescribed medications for heart disease. Reviews the medication, class of the drug, and side effects. (all other drug classes)    Go Sex-Intimacy & Heart Disease, Get SMART - Goal Setting: - Group verbal and written instruction through game format to discuss heart disease and the return to sexual intimacy. Provides group verbal and  written material to discuss and apply goal setting through the application of the S.M.A.R.T. Method.   Other Matters of the Heart: - Provides group verbal, written materials and models to describe Stable Angina and Peripheral  Artery. Includes description of the disease process and treatment options available to the cardiac patient.   Exercise & Equipment Safety: - Individual verbal instruction and demonstration of equipment use and safety with use of the equipment.   Cardiac Rehab from 05/07/2019 in [facility] Cardiac and Pulmonary Rehab  Date  05/07/19  Educator  [facility]  Instruction Review Code  1- Verbalizes Understanding      Infection Prevention: - Provides verbal and written material to individual with discussion of infection control including proper hand washing and proper equipment cleaning during exercise session.   Cardiac Rehab from 05/07/2019 in [facility] Cardiac and Pulmonary Rehab  Date  05/07/19  Educator  [facility]  Instruction Review Code  1- Verbalizes Understanding      Falls Prevention: - Provides verbal and written material to individual with discussion of falls prevention and safety.   Cardiac Rehab from 05/07/2019 in [facility] Cardiac and Pulmonary Rehab  Date  05/07/19  Educator  [facility]  Instruction Review Code  1- Verbalizes Understanding      Diabetes: - Individual verbal and written instruction to review signs/symptoms of diabetes, desired ranges of glucose level fasting, after meals and with exercise. Acknowledge that pre and post exercise glucose checks will be done for 3 sessions at entry of program.   Cardiac Rehab from 05/07/2019 in [facility] Cardiac and Pulmonary Rehab  Date  05/07/19  Educator  [facility]  Instruction Review Code  1- Verbalizes Understanding      Know Your Numbers and Risk Factors: -Group verbal and written instruction about important numbers in your health.  Discussion of what are risk factors and how they play a role in the disease process.  Review of Cholesterol, Blood  Pressure, Diabetes, and BMI and the role they play in your overall health.   Sleep Hygiene: -Provides group verbal and written instruction about how sleep can affect your health.  Define sleep hygiene, discuss sleep cycles and impact of sleep habits. Review good sleep hygiene tips.    Other: -Provides group and verbal instruction on various topics (see comments)   Knowledge Questionnaire Score:   Core Components/Risk Factors/Patient Goals at Admission: Personal Goals and Risk Factors at Admission - 05/07/19 1656      Core Components/Risk Factors/Patient Goals on Admission    Weight Management  Yes;Obesity;Weight Loss    Intervention  Weight Management: Develop a combined nutrition and exercise program designed to reach desired caloric intake, while maintaining appropriate intake of nutrient and fiber, sodium and fats, and appropriate energy expenditure required for the weight goal.;Weight Management: Provide education and appropriate resources to help participant work on and attain dietary goals.;Obesity: Provide education and appropriate resources to help participant work on and attain dietary goals.;Weight Management/Obesity: Establish reasonable short term and long term weight goals.    Admit Weight  230 lb (104.3 kg)    Goal Weight: Short Term  225 lb (102.1 kg)    Goal Weight: Long Term  220 lb (99.8 kg)    Expected Outcomes  Short Term: Continue to assess and modify interventions until short term weight is achieved;Long Term: Adherence to nutrition and physical activity/exercise program aimed toward attainment of established weight goal;Weight Loss: Understanding of general recommendations for a balanced deficit meal plan, which promotes 1-2 lb weight loss per week and includes a negative energy balance of [phone] kcal/d;Understanding recommendations for meals to include 15-35% energy as protein, 25-35% energy from fat, 35-60% energy from carbohydrates, less than 241m of dietary  cholesterol, 20-35 gm of total  fiber daily;Understanding of distribution of calorie intake throughout the day with the consumption of 4-5 meals/snacks    Diabetes  Yes    Intervention  Provide education about signs/symptoms and action to take for hypo/hyperglycemia.;Provide education about proper nutrition, including hydration, and aerobic/resistive exercise prescription along with prescribed medications to achieve blood glucose in normal ranges: Fasting glucose 65-99 mg/dL    Expected Outcomes  Short Term: Participant verbalizes understanding of the signs/symptoms and immediate care of hyper/hypoglycemia, proper foot care and importance of medication, aerobic/resistive exercise and nutrition plan for blood glucose control.;Long Term: Attainment of HbA1C < 7%.    Hypertension  Yes    Intervention  Provide education on lifestyle modifcations including regular physical activity/exercise, weight management, moderate sodium restriction and increased consumption of fresh fruit, vegetables, and low fat dairy, alcohol moderation, and smoking cessation.;Monitor prescription use compliance.    Expected Outcomes  Short Term: Continued assessment and intervention until BP is < 140/55m HG in hypertensive participants. < 130/869mHG in hypertensive participants with diabetes, heart failure or chronic kidney disease.;Long Term: Maintenance of blood pressure at goal levels.    Lipids  Yes    Intervention  Provide education and support for participant on nutrition & aerobic/resistive exercise along with prescribed medications to achieve LDL <7022mHDL >43m14m  Expected Outcomes  Short Term: Participant states understanding of desired cholesterol values and is compliant with medications prescribed. Participant is following exercise prescription and nutrition guidelines.;Long Term: Cholesterol controlled with medications as prescribed, with individualized exercise RX and with personalized nutrition plan. Value goals: LDL <  70mg22mL > 40 mg.       Core Components/Risk Factors/Patient Goals Review:  Goals and Risk Factor Review    Row Name 05/27/19 1417 06/17/19 1411 07/15/19 1419         Core Components/Risk Factors/Patient Goals Review   Personal Goals Review  Weight Management/Obesity;Hypertension;Diabetes  Weight Management/Obesity;Hypertension;Diabetes  Weight Management/Obesity;Hypertension;Diabetes;Lipids     Review  Kent [name] [place] Beltsoing well at home.  His weight stays between 215-218lbs.  Overall, his blood sugars have been good and he has not noticed any lows in his system.  Blood pressures have been good and he checks them daily at home in the morning along with his sugars. He is doing well on his medications.  Kent [name] Kent Beltsinues to do well.  His weight is 227-230 lbs recently and seems to be related to his fluid levels which flucuate greatly.  He is doing his best to stay on top of it, he also drinks a lot of water.  Sugars have been good and stay around 110 mg/Dl on average.  Today he was 93 fasting.   His blood pressures have been in the 120s-130s/70s.  He has been watching his sodium to help with control.  He has also uses our YouTube channel for exercise and education.  He picks up food alot for meals for him and his wife. He cooks sometimes but is eating everyting. Kristy checks his sugar at home and it has been stable. He states his lipids have been better. Blood pressure has been normal at home when he checks.     Expected Outcomes  Short: Continue to work on weight loss.  Long: Continue to manage diabetes.  Short: Continue to work on weight loss.  Long: Continue to manage diabetes.  Short: lose 5 pounds. Long: lose more weight with  diet and exercise.        Core Components/Risk Factors/Patient Goals at Discharge (Final  Review):  Goals and Risk Factor Review - 07/15/19 1419      Core Components/Risk Factors/Patient Goals Review   Personal Goals Review  Weight Management/Obesity;Hypertension;Diabetes;Lipids     Review  He picks up food alot for meals for him and his wife. He cooks sometimes but is eating everyting. Buel checks his sugar at home and it has been stable. He states his lipids have been better. Blood pressure has been normal at home when he checks.    Expected Outcomes  Short: lose 5 pounds. Long: lose more weight with  diet and exercise.       ITP Comments: ITP Comments    Row Name 04/29/19 1432 05/07/19 1634 05/15/19 1412 06/12/19 0627 06/19/19 1410   ITP Comments  Initial Virtual Vist completed today   Obtained consent reviewd Risk factors and set appt for EP and RD Followup.  Completed 6MWT and nutrition evaluations today.  Documentation for diagnosis can be found in [facility] encounter 04/01/2019.  Initial ITP created and sent for review to Dr. Emily Filbert, Medical Director.  30 day review cycle restarting  after being closed since March 16 because of  Covid 19 pandemic. Program opened to patients on July 6. Not all have returned. ITP updated and sent to Medical Director for review,changes as needed and signature  30 Day Review Completed today. Continue with ITP unless changed by Medical Director review.  Kent [name] says he will not be here Monday as he has spinal injections that day.  WIll need to see when he can exercise again.   [place] Name 06/26/19 1541 07/10/19 0638 08/07/19 1231 08/12/19 1129     ITP Comments  Spinal injection on 8/24 out for week.  30 Day review. Continue with ITP unless directed changes per Medical Director review.  30 day review completed. ITP sent to Dr. Emily Filbert, Medical Director of Cardiac and Pulmonary Rehab. Continue with ITP unless changes are made by physician.  Department closed starting 10/2 until further notice by infection prevention and Health at Work teams for [place].  Kent [name] called to let us know that he would like to discharge from the program.  He is getting busier at work.  Discharge ITP sent.       Comments: Discharge ITP

## 2019-08-14 ENCOUNTER — Ambulatory Visit: Payer: PRIVATE HEALTH INSURANCE

## 2019-08-19 ENCOUNTER — Ambulatory Visit: Payer: PRIVATE HEALTH INSURANCE

## 2019-08-21 ENCOUNTER — Ambulatory Visit: Payer: PRIVATE HEALTH INSURANCE

## 2019-08-26 ENCOUNTER — Ambulatory Visit: Payer: PRIVATE HEALTH INSURANCE

## 2019-08-28 ENCOUNTER — Ambulatory Visit: Payer: PRIVATE HEALTH INSURANCE

## 2020-02-12 IMAGING — DX PORTABLE CHEST - 1 VIEW
1 series · 1 of 1 positions shown · non-contrast
Comparison: 09/29/2018

CLINICAL DATA: Chest pain with shortness of breath for 2 weeks,
worse in the past day.

EXAM:
PORTABLE CHEST 1 VIEW

[chest ap]
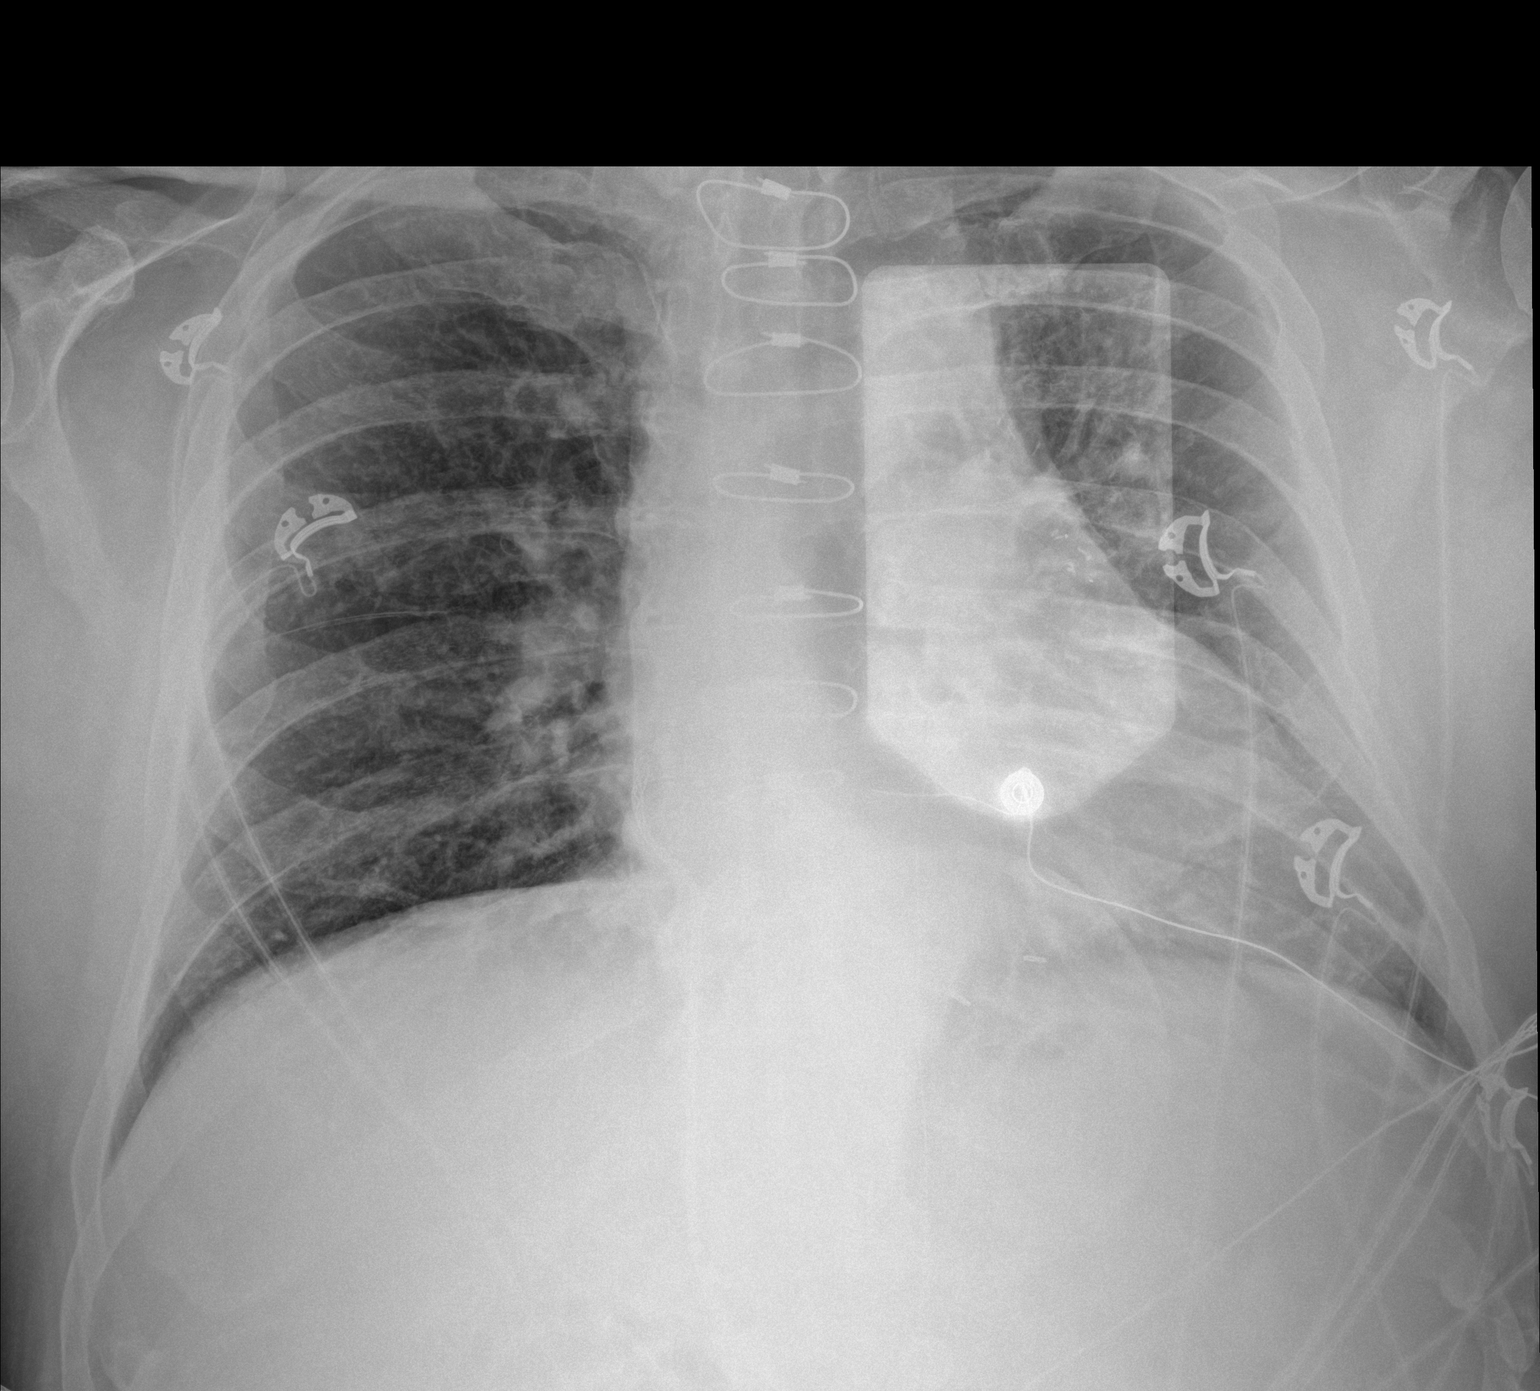

[1 of 1 positions shown; findings below may reference images not displayed]

FINDINGS: Pacing/defibrillator pads partially obscure the left lung. Sequelae
of CABG are again identified. The cardiomediastinal silhouette is
unchanged with normal heart size. No airspace consolidation, edema,
pleural effusion, pneumothorax is identified. No acute osseous
abnormality is seen.
IMPRESSION: No active disease.

## 2020-02-17 ENCOUNTER — Other Ambulatory Visit: Payer: Self-pay | Admitting: Physical Medicine and Rehabilitation

## 2020-02-17 DIAGNOSIS — M5416 Radiculopathy, lumbar region: Secondary | ICD-10-CM

## 2020-02-27 ENCOUNTER — Ambulatory Visit
Admission: RE | Admit: 2020-02-27 | Discharge: 2020-02-27 | Disposition: A | Discharge status: Home / Self Care | Payer: Medicare Other | Source: Ambulatory Visit | Attending: Physical Medicine and Rehabilitation | Admitting: Physical Medicine and Rehabilitation

## 2020-02-27 DIAGNOSIS — M5416 Radiculopathy, lumbar region: Secondary | ICD-10-CM

## 2020-03-06 ENCOUNTER — Other Ambulatory Visit: Payer: Self-pay | Admitting: Neurosurgery

## 2020-03-16 ENCOUNTER — Encounter: Admission: RE | Admit: 2020-03-16 | Payer: PRIVATE HEALTH INSURANCE | Source: Ambulatory Visit

## 2020-03-23 ENCOUNTER — Other Ambulatory Visit: Payer: PRIVATE HEALTH INSURANCE

## 2020-03-31 ENCOUNTER — Other Ambulatory Visit: Payer: Self-pay

## 2020-03-31 ENCOUNTER — Encounter
Admission: RE | Admit: 2020-03-31 | Discharge: 2020-03-31 | Disposition: A | Discharge status: Home / Self Care | Payer: Medicare Other | Source: Ambulatory Visit | Attending: Neurosurgery | Admitting: Neurosurgery

## 2020-03-31 DIAGNOSIS — E119 Type 2 diabetes mellitus without complications: Secondary | ICD-10-CM | POA: Diagnosis not present

## 2020-03-31 DIAGNOSIS — Z01818 Encounter for other preprocedural examination: Secondary | ICD-10-CM | POA: Insufficient documentation

## 2020-03-31 DIAGNOSIS — I10 Essential (primary) hypertension: Secondary | ICD-10-CM | POA: Diagnosis not present

## 2020-03-31 DIAGNOSIS — I252 Old myocardial infarction: Secondary | ICD-10-CM | POA: Diagnosis not present

## 2020-03-31 DIAGNOSIS — I4510 Unspecified right bundle-branch block: Secondary | ICD-10-CM | POA: Insufficient documentation

## 2020-03-31 HISTORY — DX: Other intervertebral disc degeneration, lumbar region without mention of lumbar back pain or lower extremity pain: M51.369

## 2020-03-31 HISTORY — DX: Atherosclerotic heart disease of native coronary artery without angina pectoris: I25.10

## 2020-03-31 HISTORY — DX: Gastro-esophageal reflux disease without esophagitis: K21.9

## 2020-03-31 HISTORY — DX: Anxiety disorder, unspecified: F41.9

## 2020-03-31 HISTORY — DX: Malignant (primary) neoplasm, unspecified: C80.1

## 2020-03-31 HISTORY — DX: Gastrointestinal hemorrhage, unspecified: K92.2

## 2020-03-31 HISTORY — DX: Personal history of other medical treatment: Z92.89

## 2020-03-31 HISTORY — DX: Depression, unspecified: F32.A

## 2020-03-31 HISTORY — DX: Other intervertebral disc degeneration, lumbar region: M51.36

## 2020-03-31 HISTORY — DX: Sleep apnea, unspecified: G47.30

## 2020-03-31 NOTE — Patient Instructions (Signed)
INSTRUCTIONS FOR SURGERY     Your surgery is scheduled for:   Wednesday, June 9TH     To find out your arrival time for the day of surgery,          please call [phone] between 1 pm and 3 pm on :  Tuesday, June 8TH     When you arrive for surgery, report to the [facility].       Do NOT stop on the first floor to register.    REMEMBER: Instructions that are not followed completely may result in serious medical risk,  up to and including death, or upon the discretion of your surgeon and anesthesiologist,            your surgery may need to be rescheduled.  __X__ 1. Do not eat food after midnight the night before your procedure.                    No gum, candy, lozenger, tic tacs, tums or hard candies.                  ABSOLUTELY NOTHING SOLID IN YOUR MOUTH AFTER MIDNIGHT                    You may drink unlimited clear liquids up to 2 hours before you are scheduled to arrive for surgery.                   Do not drink anything within those 2 hours unless you need to take medicine, then take the                   smallest amount you need.  Clear liquids include:  water, apple juice without pulp,                   any flavor Gatorade, Black coffee, black tea.  Sugar may be added but no dairy/ honey /lemon.                        Broth and jello is not considered a clear liquid.  __x__  2. On the morning of surgery, please brush your teeth with toothpaste and water. You may rinse with                  mouthwash if you wish but DO NOT SWALLOW TOOTHPASTE OR MOUTHWASH  __X___3. NO alcohol for 24 hours before or after surgery.  __x___ 4.  Do NOT smoke or use e-cigarettes for 24 HOURS PRIOR TO SURGERY.                      DO NOT Use any chewable tobacco products for at least 6 hours prior to surgery.  __x___ 5. If you start any new medication after this appointment and prior to surgery, please   Bring it with you on the day of surgery.  ___x__ 6. Notify your doctor if there is any change in your medical condition, such as fever,  infection, vomitting, diarrhea or any open sores.  __x___ 7.  USE the CHG SOAP as instructed, the night before surgery and the day of surgery.                   Once you have washed with this soap, do NOT use any of the following: Powders, perfumes                    or lotions. Please do not wear make up, hairpins, clips or nail polish. You MAY wear deodorant.                   Men may shave their face and neck.  Women need to shave 48 hours prior to surgery.                   DO NOT wear ANY jewelry on the day of surgery. If there are rings that are too tight to                    remove easily, please address this prior to the surgery day. Piercings need to be removed.                                                                     NO METAL ON YOUR BODY.                    Do NOT bring any valuables.  If you came to Pre-Admit testing then you will not need license,                     insurance card or credit card.  If you will be staying overnight, please either leave your things in                     the car or have your family be responsible for these items.                      IS NOT RESPONSIBLE FOR BELONGINGS OR VALUABLES.  ___X__ 8. DO NOT wear contact lenses on surgery day.  You may not have dentures,                     Hearing aides, contacts or glasses in the operating room. These items can be                    Placed in the Recovery Room to receive immediately after surgery.  __x___ 9. IF YOU ARE SCHEDULED TO GO HOME ON THE SAME DAY, YOU MUST                   Have someone to drive you home and to stay with you  for the first 24 hours.                    Have an arrangement prior to arriving on surgery day.  ___x__ 10. Take the following medications on the morning of surgery with a sip of water:  1. XANAX                     2. CARVEDILOL                     3. PRILOSEC                     4. PAXIL                     5. NORCO                     6.  _____ 11.  Follow any instructions provided to you by your surgeon.                        Such as enema, clear liquid bowel prep  __X__  12. STOP PLAVIX  AND ASPIRIN AS OF: Tuesday, June 2ND                       THIS INCLUDES BC POWDERS / GOODIES POWDER  __x___ 13. STOP Anti-inflammatories as of:  Tuesday, June 2ND                       This includes IBUPROFEN / MOTRIN / ADVIL / ALEVE/ NAPROXYN                    YOU MAY TAKE TYLENOL ANY TIME PRIOR TO SURGERY.  __X___ 53.  Stop supplements until after surgery.                     This includes: FERROUS SULFATE // MAGNESIUM                 __X___ 15. Bring your CPAP machine into preop with you on the morning of surgery.  ___X___16.  Stop Metformin 2 full days prior to surgery.                       Stop on: June 2ND AFTER DINNER DOSE.                                     Do NOT take any diabetes medications on surgery day.  __X____17.  Continue to take the following medications but do not take on the morning of surgery:                           LISINOPRIL  ___X___18. If staying overnight, please have appropriate shoes to wear to be able to walk around the unit.                   Wear clean and comfortable clothing to the hospital.  CONTINUE TAKING YOUR EVENING MEDICATIONS AS USUAL. IF YOU ARE BRINGING A CELL PHONE, PLEASE REMEMBER A CHARGER. BRING PHONE NUMBERS FOR CONTACT PEOPLE.  REMEMBER YOUR CPAP

## 2020-04-01 ENCOUNTER — Encounter
Admission: RE | Admit: 2020-04-01 | Discharge: 2020-04-01 | Disposition: A | Discharge status: Home / Self Care | Payer: Medicare Other | Source: Ambulatory Visit | Attending: Neurosurgery | Admitting: Neurosurgery

## 2020-04-01 DIAGNOSIS — Z01818 Encounter for other preprocedural examination: Secondary | ICD-10-CM | POA: Diagnosis not present

## 2020-04-01 LAB — CBC
HCT: 41.3 % (ref 39.0–52.0)
Hemoglobin: 14.5 g/dL (ref 13.0–17.0)
MCH: 30.9 pg (ref 26.0–34.0)
MCHC: 35.1 g/dL (ref 30.0–36.0)
MCV: 87.9 fL (ref 80.0–100.0)
Platelets: 129 K/uL — ABNORMAL LOW (ref 150–400)
RBC: 4.7 MIL/uL (ref 4.22–5.81)
RDW: 11.9 % (ref 11.5–15.5)
WBC: 5.5 K/uL (ref 4.0–10.5)
nRBC: 0 % (ref 0.0–0.2)

## 2020-04-01 LAB — PROTIME-INR
INR: 1.1 (ref 0.8–1.2)
Prothrombin Time: 13.3 seconds (ref 11.4–15.2)

## 2020-04-01 LAB — URINALYSIS, ROUTINE W REFLEX MICROSCOPIC
Bilirubin Urine: NEGATIVE
Glucose, UA: 50 mg/dL — AB
Hgb urine dipstick: NEGATIVE
Ketones, ur: NEGATIVE mg/dL
Leukocytes,Ua: NEGATIVE
Nitrite: NEGATIVE
Protein, ur: NEGATIVE mg/dL
Specific Gravity, Urine: 1.005 (ref 1.005–1.030)
pH: 6 (ref 5.0–8.0)

## 2020-04-01 LAB — BASIC METABOLIC PANEL
Anion gap: 10 (ref 5–15)
BUN: 12 mg/dL (ref 8–23)
CO2: 30 mmol/L (ref 22–32)
Calcium: 9.6 mg/dL (ref 8.9–10.3)
Chloride: 100 mmol/L (ref 98–111)
Creatinine, Ser: 0.96 mg/dL (ref 0.61–1.24)
GFR calc Af Amer: >60 mL/min (ref >60)
GFR calc non Af Amer: >60 mL/min (ref >60)
Glucose, Bld: 204 mg/dL — ABNORMAL HIGH (ref 70–99)
Potassium: 4 mmol/L (ref 3.5–5.1)
Sodium: 140 mmol/L (ref 135–145)

## 2020-04-01 LAB — SURGICAL PCR SCREEN
MRSA, PCR: NEGATIVE
Staphylococcus aureus: POSITIVE — AB

## 2020-04-01 LAB — APTT: aPTT: 34 s (ref 24–36)

## 2020-04-02 ENCOUNTER — Other Ambulatory Visit: Admission: RE | Admit: 2020-04-02 | Payer: PRIVATE HEALTH INSURANCE | Source: Ambulatory Visit

## 2020-04-06 ENCOUNTER — Other Ambulatory Visit
Admission: RE | Admit: 2020-04-06 | Discharge: 2020-04-06 | Disposition: A | Discharge status: Home / Self Care | Payer: Medicare Other | Source: Ambulatory Visit | Attending: Neurosurgery | Admitting: Neurosurgery

## 2020-04-06 ENCOUNTER — Other Ambulatory Visit: Payer: Self-pay

## 2020-04-06 DIAGNOSIS — Z20822 Contact with and (suspected) exposure to covid-19: Secondary | ICD-10-CM | POA: Diagnosis not present

## 2020-04-06 DIAGNOSIS — Z01812 Encounter for preprocedural laboratory examination: Secondary | ICD-10-CM | POA: Diagnosis present

## 2020-04-06 LAB — SARS CORONAVIRUS 2 (TAT 6-24 HRS): SARS Coronavirus 2: NEGATIVE

## 2020-04-08 ENCOUNTER — Observation Stay
Admission: RE | Admit: 2020-04-08 | Discharge: 2020-04-09 | Disposition: A | Discharge status: Home / Self Care | Payer: Medicare Other | Source: Ambulatory Visit | Attending: Neurosurgery | Admitting: Neurosurgery

## 2020-04-08 ENCOUNTER — Encounter: Payer: Self-pay | Admitting: Neurosurgery

## 2020-04-08 ENCOUNTER — Other Ambulatory Visit: Payer: Self-pay

## 2020-04-08 ENCOUNTER — Ambulatory Visit: Payer: Medicare Other | Admitting: Registered Nurse

## 2020-04-08 ENCOUNTER — Ambulatory Visit: Payer: Medicare Other

## 2020-04-08 ENCOUNTER — Encounter
Admission: RE | Disposition: A | Discharge status: Home / Self Care | Payer: Self-pay | Source: Ambulatory Visit | Attending: Neurosurgery

## 2020-04-08 DIAGNOSIS — I10 Essential (primary) hypertension: Secondary | ICD-10-CM | POA: Insufficient documentation

## 2020-04-08 DIAGNOSIS — Z79899 Other long term (current) drug therapy: Secondary | ICD-10-CM | POA: Diagnosis not present

## 2020-04-08 DIAGNOSIS — I2510 Atherosclerotic heart disease of native coronary artery without angina pectoris: Secondary | ICD-10-CM | POA: Diagnosis not present

## 2020-04-08 DIAGNOSIS — Z7902 Long term (current) use of antithrombotics/antiplatelets: Secondary | ICD-10-CM | POA: Diagnosis not present

## 2020-04-08 DIAGNOSIS — Z951 Presence of aortocoronary bypass graft: Secondary | ICD-10-CM | POA: Diagnosis not present

## 2020-04-08 DIAGNOSIS — Z7982 Long term (current) use of aspirin: Secondary | ICD-10-CM | POA: Insufficient documentation

## 2020-04-08 DIAGNOSIS — Z7984 Long term (current) use of oral hypoglycemic drugs: Secondary | ICD-10-CM | POA: Diagnosis not present

## 2020-04-08 DIAGNOSIS — I252 Old myocardial infarction: Secondary | ICD-10-CM | POA: Diagnosis not present

## 2020-04-08 DIAGNOSIS — Z955 Presence of coronary angioplasty implant and graft: Secondary | ICD-10-CM | POA: Diagnosis not present

## 2020-04-08 DIAGNOSIS — F329 Major depressive disorder, single episode, unspecified: Secondary | ICD-10-CM | POA: Diagnosis not present

## 2020-04-08 DIAGNOSIS — D649 Anemia, unspecified: Secondary | ICD-10-CM | POA: Insufficient documentation

## 2020-04-08 DIAGNOSIS — E1140 Type 2 diabetes mellitus with diabetic neuropathy, unspecified: Secondary | ICD-10-CM | POA: Insufficient documentation

## 2020-04-08 DIAGNOSIS — M5416 Radiculopathy, lumbar region: Secondary | ICD-10-CM | POA: Diagnosis present

## 2020-04-08 DIAGNOSIS — E785 Hyperlipidemia, unspecified: Secondary | ICD-10-CM | POA: Diagnosis not present

## 2020-04-08 DIAGNOSIS — F410 Panic disorder [episodic paroxysmal anxiety] without agoraphobia: Secondary | ICD-10-CM | POA: Diagnosis not present

## 2020-04-08 DIAGNOSIS — K2100 Gastro-esophageal reflux disease with esophagitis, without bleeding: Secondary | ICD-10-CM | POA: Insufficient documentation

## 2020-04-08 DIAGNOSIS — G4730 Sleep apnea, unspecified: Secondary | ICD-10-CM | POA: Insufficient documentation

## 2020-04-08 DIAGNOSIS — M48062 Spinal stenosis, lumbar region with neurogenic claudication: Secondary | ICD-10-CM | POA: Diagnosis present

## 2020-04-08 DIAGNOSIS — Z87891 Personal history of nicotine dependence: Secondary | ICD-10-CM | POA: Diagnosis not present

## 2020-04-08 DIAGNOSIS — Z419 Encounter for procedure for purposes other than remedying health state, unspecified: Secondary | ICD-10-CM

## 2020-04-08 HISTORY — PX: LUMBAR LAMINECTOMY/DECOMPRESSION MICRODISCECTOMY: SHX5026

## 2020-04-08 LAB — GLUCOSE, CAPILLARY
Glucose-Capillary: 127 mg/dL — ABNORMAL HIGH (ref 70–99)
Glucose-Capillary: 84 mg/dL (ref 70–99)
Glucose-Capillary: 182 mg/dL — ABNORMAL HIGH (ref 70–99)
Glucose-Capillary: 273 mg/dL — ABNORMAL HIGH (ref 70–99)

## 2020-04-08 LAB — ABO/RH: ABO/RH(D): O POS

## 2020-04-08 LAB — HEMOGLOBIN A1C
Hgb A1c MFr Bld: 7.3 % — ABNORMAL HIGH (ref 4.8–5.6)
Mean Plasma Glucose: 162.81 mg/dL

## 2020-04-08 SURGERY — LUMBAR LAMINECTOMY/DECOMPRESSION MICRODISCECTOMY 3 LEVELS
Anesthesia: General

## 2020-04-08 MED ORDER — ROCURONIUM BROMIDE 100 MG/10ML IV SOLN
INTRAVENOUS | Status: DC | PRN
  Administered 2020-04-08: 10 mg via INTRAVENOUS

## 2020-04-08 MED ORDER — GLYCOPYRROLATE 0.2 MG/ML IJ SOLN
INTRAMUSCULAR | Status: DC | PRN
  Administered 2020-04-08: .2 mg via INTRAVENOUS

## 2020-04-08 MED ORDER — EPHEDRINE 5 MG/ML INJ
INTRAVENOUS | Status: AC
  Filled 2020-04-08: qty 10

## 2020-04-08 MED ORDER — METHOCARBAMOL 1000 MG/10ML IJ SOLN
500.0000 mg | Freq: Four times a day (QID) | INTRAVENOUS | Status: DC | PRN
  Administered 2020-04-08: 500 mg via INTRAVENOUS
  Filled 2020-04-08 (×2): qty 5

## 2020-04-08 MED ORDER — BUPIVACAINE-EPINEPHRINE (PF) 0.5% -1:200000 IJ SOLN
INTRAMUSCULAR | Status: DC | PRN
  Administered 2020-04-08: 5 mL

## 2020-04-08 MED ORDER — LATANOPROST 0.005 % OP SOLN
1.0000 [drp] | Freq: Every day | OPHTHALMIC | Status: DC
  Administered 2020-04-08: 1 [drp] via OPHTHALMIC
  Filled 2020-04-08: qty 2.5

## 2020-04-08 MED ORDER — MENTHOL 3 MG MT LOZG
1.0000 | LOZENGE | OROMUCOSAL | Status: DC | PRN
  Filled 2020-04-08: qty 9

## 2020-04-08 MED ORDER — DEXAMETHASONE SODIUM PHOSPHATE 10 MG/ML IJ SOLN
INTRAMUSCULAR | Status: AC
  Filled 2020-04-08: qty 1

## 2020-04-08 MED ORDER — BUPIVACAINE-EPINEPHRINE (PF) 0.5% -1:200000 IJ SOLN
INTRAMUSCULAR | Status: AC
  Filled 2020-04-08: qty 90

## 2020-04-08 MED ORDER — CEFAZOLIN SODIUM-DEXTROSE 2-4 GM/100ML-% IV SOLN
2.0000 g | INTRAVENOUS | Status: AC
  Administered 2020-04-08: 2 g via INTRAVENOUS

## 2020-04-08 MED ORDER — SENNA 8.6 MG PO TABS
1.0000 | ORAL_TABLET | Freq: Two times a day (BID) | ORAL | Status: DC
  Administered 2020-04-08 – 2020-04-09 (×2): 8.6 mg via ORAL
  Filled 2020-04-08 (×2): qty 1

## 2020-04-08 MED ORDER — ONDANSETRON HCL 4 MG/2ML IJ SOLN
INTRAMUSCULAR | Status: DC | PRN
  Administered 2020-04-08: 4 mg via INTRAVENOUS

## 2020-04-08 MED ORDER — CEFAZOLIN SODIUM-DEXTROSE 2-4 GM/100ML-% IV SOLN
INTRAVENOUS | Status: AC
  Filled 2020-04-08: qty 100

## 2020-04-08 MED ORDER — FENTANYL CITRATE (PF) 100 MCG/2ML IJ SOLN
25.0000 ug | INTRAMUSCULAR | Status: DC | PRN
  Administered 2020-04-08 (×3): 25 ug via INTRAVENOUS

## 2020-04-08 MED ORDER — PANTOPRAZOLE SODIUM 40 MG PO TBEC
40.0000 mg | DELAYED_RELEASE_TABLET | Freq: Every day | ORAL | Status: DC
  Administered 2020-04-09 (×2): 40 mg via ORAL
  Filled 2020-04-08 (×2): qty 1

## 2020-04-08 MED ORDER — GABAPENTIN 300 MG PO CAPS
300.0000 mg | ORAL_CAPSULE | Freq: Every day | ORAL | Status: DC
  Administered 2020-04-08: 300 mg via ORAL
  Filled 2020-04-08: qty 1

## 2020-04-08 MED ORDER — ACETAMINOPHEN 650 MG RE SUPP: 650.0000 mg | RECTAL | Status: DC | PRN

## 2020-04-08 MED ORDER — ALPRAZOLAM 0.25 MG PO TABS
0.1250 mg | ORAL_TABLET | Freq: Every day | ORAL | Status: DC | PRN
  Administered 2020-04-09 (×2): 0.125 mg via ORAL
  Filled 2020-04-08 (×2): qty 1

## 2020-04-08 MED ORDER — FENTANYL CITRATE (PF) 100 MCG/2ML IJ SOLN
INTRAMUSCULAR | Status: AC
  Administered 2020-04-08: 25 ug via INTRAVENOUS
  Filled 2020-04-08: qty 2

## 2020-04-08 MED ORDER — HYDROCODONE-ACETAMINOPHEN 5-325 MG PO TABS
1.0000 | ORAL_TABLET | Freq: Four times a day (QID) | ORAL | Status: DC | PRN
  Administered 2020-04-08: 1 via ORAL
  Administered 2020-04-09 (×2): 2 via ORAL
  Filled 2020-04-08: qty 1
  Filled 2020-04-08 (×2): qty 2

## 2020-04-08 MED ORDER — POLYETHYLENE GLYCOL 3350 17 G PO PACK: 17.0000 g | PACK | Freq: Every day | ORAL | Status: DC | PRN

## 2020-04-08 MED ORDER — PHENOL 1.4 % MT LIQD
1.0000 | OROMUCOSAL | Status: DC | PRN
  Filled 2020-04-08: qty 177

## 2020-04-08 MED ORDER — SODIUM CHLORIDE 0.9 % IV SOLN: 250.0000 mL | INTRAVENOUS | Status: DC

## 2020-04-08 MED ORDER — CHLORHEXIDINE GLUCONATE 0.12 % MT SOLN: 15.0000 mL | Freq: Once | OROMUCOSAL | Status: AC

## 2020-04-08 MED ORDER — FENTANYL CITRATE (PF) 100 MCG/2ML IJ SOLN
INTRAMUSCULAR | Status: DC | PRN
  Administered 2020-04-08: 50 ug via INTRAVENOUS
  Administered 2020-04-08: 25 ug via INTRAVENOUS
  Administered 2020-04-08: 50 ug via INTRAVENOUS

## 2020-04-08 MED ORDER — ROCURONIUM BROMIDE 10 MG/ML (PF) SYRINGE
PREFILLED_SYRINGE | INTRAVENOUS | Status: AC
  Filled 2020-04-08: qty 10

## 2020-04-08 MED ORDER — DEXMEDETOMIDINE HCL 200 MCG/2ML IV SOLN
INTRAVENOUS | Status: DC | PRN
  Administered 2020-04-08: 4 ug via INTRAVENOUS
  Administered 2020-04-08: 8 ug via INTRAVENOUS

## 2020-04-08 MED ORDER — FENTANYL CITRATE (PF) 100 MCG/2ML IJ SOLN
25.0000 ug | INTRAMUSCULAR | Status: DC | PRN
  Administered 2020-04-08: 25 ug via INTRAVENOUS

## 2020-04-08 MED ORDER — ATORVASTATIN CALCIUM 20 MG PO TABS
40.0000 mg | ORAL_TABLET | Freq: Every day | ORAL | Status: DC
  Administered 2020-04-09: 40 mg via ORAL
  Filled 2020-04-08: qty 2

## 2020-04-08 MED ORDER — SODIUM CHLORIDE 0.9 % IR SOLN
Status: DC | PRN
  Administered 2020-04-08: 1000 mL

## 2020-04-08 MED ORDER — PAROXETINE HCL 20 MG PO TABS
20.0000 mg | ORAL_TABLET | Freq: Every day | ORAL | Status: DC
  Administered 2020-04-09: 20 mg via ORAL
  Filled 2020-04-08: qty 1

## 2020-04-08 MED ORDER — SODIUM CHLORIDE 0.9 % IV SOLN: INTRAVENOUS | Status: DC

## 2020-04-08 MED ORDER — EPHEDRINE SULFATE 50 MG/ML IJ SOLN
INTRAMUSCULAR | Status: DC | PRN
  Administered 2020-04-08 (×3): 10 mg via INTRAVENOUS

## 2020-04-08 MED ORDER — HYDROMORPHONE HCL 1 MG/ML IJ SOLN
0.5000 mg | INTRAMUSCULAR | Status: DC | PRN
  Administered 2020-04-08: 0.5 mg via INTRAVENOUS
  Filled 2020-04-08: qty 1

## 2020-04-08 MED ORDER — LISINOPRIL 20 MG PO TABS
20.0000 mg | ORAL_TABLET | Freq: Every day | ORAL | Status: DC
  Administered 2020-04-09: 20 mg via ORAL
  Filled 2020-04-08: qty 1

## 2020-04-08 MED ORDER — SODIUM CHLORIDE 0.9% FLUSH: 3.0000 mL | INTRAVENOUS | Status: DC | PRN

## 2020-04-08 MED ORDER — SODIUM CHLORIDE 0.9 % IV SOLN
INTRAVENOUS | Status: DC | PRN
  Administered 2020-04-08: 40 mL

## 2020-04-08 MED ORDER — FENTANYL CITRATE (PF) 250 MCG/5ML IJ SOLN
INTRAMUSCULAR | Status: AC
  Filled 2020-04-08: qty 5

## 2020-04-08 MED ORDER — CARVEDILOL 12.5 MG PO TABS
12.5000 mg | ORAL_TABLET | Freq: Three times a day (TID) | ORAL | Status: DC
  Administered 2020-04-08 – 2020-04-09 (×2): 12.5 mg via ORAL
  Filled 2020-04-08 (×2): qty 1

## 2020-04-08 MED ORDER — ONDANSETRON HCL 4 MG/2ML IJ SOLN
INTRAMUSCULAR | Status: AC
  Filled 2020-04-08: qty 2

## 2020-04-08 MED ORDER — ACETAMINOPHEN 325 MG PO TABS: 650.0000 mg | ORAL_TABLET | ORAL | Status: DC | PRN

## 2020-04-08 MED ORDER — MIDAZOLAM HCL 2 MG/2ML IJ SOLN
INTRAMUSCULAR | Status: AC
  Filled 2020-04-08: qty 2

## 2020-04-08 MED ORDER — GLYCOPYRROLATE 0.2 MG/ML IJ SOLN
INTRAMUSCULAR | Status: AC
  Filled 2020-04-08: qty 1

## 2020-04-08 MED ORDER — ISOSORBIDE MONONITRATE ER 30 MG PO TB24
30.0000 mg | ORAL_TABLET | Freq: Every day | ORAL | Status: DC
  Administered 2020-04-08: 30 mg via ORAL
  Filled 2020-04-08: qty 1

## 2020-04-08 MED ORDER — THROMBIN 5000 UNITS EX SOLR
CUTANEOUS | Status: DC | PRN
  Administered 2020-04-08: 5000 [IU] via TOPICAL

## 2020-04-08 MED ORDER — METHOCARBAMOL 500 MG PO TABS
500.0000 mg | ORAL_TABLET | Freq: Four times a day (QID) | ORAL | Status: DC | PRN
  Administered 2020-04-09: 500 mg via ORAL
  Filled 2020-04-08: qty 1

## 2020-04-08 MED ORDER — FAMOTIDINE 20 MG PO TABS
ORAL_TABLET | ORAL | Status: AC
  Filled 2020-04-08: qty 1

## 2020-04-08 MED ORDER — LIDOCAINE HCL (PF) 2 % IJ SOLN
INTRAMUSCULAR | Status: AC
  Filled 2020-04-08: qty 5

## 2020-04-08 MED ORDER — THROMBIN 5000 UNITS EX SOLR
CUTANEOUS | Status: AC
  Filled 2020-04-08: qty 5000

## 2020-04-08 MED ORDER — ONDANSETRON HCL 4 MG/2ML IJ SOLN: 4.0000 mg | Freq: Once | INTRAMUSCULAR | Status: DC | PRN

## 2020-04-08 MED ORDER — SODIUM CHLORIDE 0.9 % IV SOLN
INTRAVENOUS | Status: DC | PRN
  Administered 2020-04-08: 20 ug/min via INTRAVENOUS

## 2020-04-08 MED ORDER — ACETAMINOPHEN 10 MG/ML IV SOLN
INTRAVENOUS | Status: DC | PRN
  Administered 2020-04-08: 1000 mg via INTRAVENOUS

## 2020-04-08 MED ORDER — FLEET ENEMA 7-19 GM/118ML RE ENEM: 1.0000 | ENEMA | Freq: Once | RECTAL | Status: DC | PRN

## 2020-04-08 MED ORDER — SUCCINYLCHOLINE CHLORIDE 20 MG/ML IJ SOLN
INTRAMUSCULAR | Status: DC | PRN
  Administered 2020-04-08: 110 mg via INTRAVENOUS

## 2020-04-08 MED ORDER — TRAMADOL HCL 50 MG PO TABS: 25.0000 mg | ORAL_TABLET | Freq: Four times a day (QID) | ORAL | Status: DC | PRN

## 2020-04-08 MED ORDER — SUGAMMADEX SODIUM 500 MG/5ML IV SOLN
INTRAVENOUS | Status: DC | PRN
  Administered 2020-04-08: 220 mg via INTRAVENOUS

## 2020-04-08 MED ORDER — ONDANSETRON HCL 4 MG/2ML IJ SOLN: 4.0000 mg | Freq: Four times a day (QID) | INTRAMUSCULAR | Status: DC | PRN

## 2020-04-08 MED ORDER — BUPIVACAINE LIPOSOME 1.3 % IJ SUSP
INTRAMUSCULAR | Status: AC
  Filled 2020-04-08: qty 20

## 2020-04-08 MED ORDER — METFORMIN HCL 500 MG PO TABS
500.0000 mg | ORAL_TABLET | Freq: Three times a day (TID) | ORAL | Status: DC
  Administered 2020-04-09: 500 mg via ORAL
  Filled 2020-04-08: qty 1

## 2020-04-08 MED ORDER — MIDAZOLAM HCL 2 MG/2ML IJ SOLN
INTRAMUSCULAR | Status: DC | PRN
  Administered 2020-04-08: 1 mg via INTRAVENOUS

## 2020-04-08 MED ORDER — SODIUM CHLORIDE 0.9% FLUSH
3.0000 mL | Freq: Two times a day (BID) | INTRAVENOUS | Status: DC
  Administered 2020-04-09: 3 mL via INTRAVENOUS

## 2020-04-08 MED ORDER — PROPOFOL 10 MG/ML IV BOLUS
INTRAVENOUS | Status: DC | PRN
  Administered 2020-04-08: 200 mg via INTRAVENOUS

## 2020-04-08 MED ORDER — BACITRACIN 50000 UNITS IM SOLR
INTRAMUSCULAR | Status: AC
  Filled 2020-04-08: qty 1

## 2020-04-08 MED ORDER — INSULIN ASPART 100 UNIT/ML ~~LOC~~ SOLN
0.0000 [IU] | Freq: Three times a day (TID) | SUBCUTANEOUS | Status: DC
  Administered 2020-04-08: 3 [IU] via SUBCUTANEOUS
  Administered 2020-04-09: 2 [IU] via SUBCUTANEOUS
  Filled 2020-04-08 (×2): qty 1

## 2020-04-08 MED ORDER — ACETAMINOPHEN 10 MG/ML IV SOLN
INTRAVENOUS | Status: AC
  Filled 2020-04-08: qty 100

## 2020-04-08 MED ORDER — SODIUM CHLORIDE FLUSH 0.9 % IV SOLN
INTRAVENOUS | Status: AC
  Filled 2020-04-08: qty 40

## 2020-04-08 MED ORDER — BUPIVACAINE HCL (PF) 0.5 % IJ SOLN
INTRAMUSCULAR | Status: AC
  Filled 2020-04-08: qty 30

## 2020-04-08 MED ORDER — METHYLPREDNISOLONE ACETATE 40 MG/ML IJ SUSP
INTRAMUSCULAR | Status: DC | PRN
  Administered 2020-04-08: 40 mg

## 2020-04-08 MED ORDER — KETAMINE HCL 10 MG/ML IJ SOLN
INTRAMUSCULAR | Status: DC | PRN
  Administered 2020-04-08: 20 mg via INTRAVENOUS
  Administered 2020-04-08: 10 mg via INTRAVENOUS
  Administered 2020-04-08: 20 mg via INTRAVENOUS
  Administered 2020-04-08: 10 mg via INTRAVENOUS

## 2020-04-08 MED ORDER — SUGAMMADEX SODIUM 500 MG/5ML IV SOLN
INTRAVENOUS | Status: AC
  Filled 2020-04-08: qty 5

## 2020-04-08 MED ORDER — CHLORHEXIDINE GLUCONATE 0.12 % MT SOLN
OROMUCOSAL | Status: AC
  Administered 2020-04-08: 15 mL via OROMUCOSAL
  Filled 2020-04-08: qty 15

## 2020-04-08 MED ORDER — BUPIVACAINE HCL (PF) 0.5 % IJ SOLN
INTRAMUSCULAR | Status: DC | PRN
  Administered 2020-04-08: 20 mL

## 2020-04-08 MED ORDER — DEXAMETHASONE SODIUM PHOSPHATE 10 MG/ML IJ SOLN
INTRAMUSCULAR | Status: DC | PRN
  Administered 2020-04-08: 10 mg via INTRAVENOUS

## 2020-04-08 MED ORDER — METHYLPREDNISOLONE ACETATE 40 MG/ML IJ SUSP
INTRAMUSCULAR | Status: AC
  Filled 2020-04-08: qty 1

## 2020-04-08 MED ORDER — LIDOCAINE HCL (CARDIAC) PF 100 MG/5ML IV SOSY
PREFILLED_SYRINGE | INTRAVENOUS | Status: DC | PRN
  Administered 2020-04-08: 50 mg via INTRAVENOUS
  Administered 2020-04-08: 100 mg via INTRAVENOUS
  Administered 2020-04-08: 50 mg via INTRAVENOUS

## 2020-04-08 MED ORDER — ORAL CARE MOUTH RINSE: 15.0000 mL | Freq: Once | OROMUCOSAL | Status: AC

## 2020-04-08 MED ORDER — SUCCINYLCHOLINE CHLORIDE 200 MG/10ML IV SOSY
PREFILLED_SYRINGE | INTRAVENOUS | Status: AC
  Filled 2020-04-08: qty 10

## 2020-04-08 MED ORDER — ONDANSETRON HCL 4 MG PO TABS: 4.0000 mg | ORAL_TABLET | Freq: Four times a day (QID) | ORAL | Status: DC | PRN

## 2020-04-08 MED ORDER — BISACODYL 10 MG RE SUPP: 10.0000 mg | Freq: Every day | RECTAL | Status: DC | PRN

## 2020-04-08 SURGICAL SUPPLY — 56 items
BUR NEURO DRILL SOFT 3.0X3.8M (BURR) ×3 IMPLANT
CANISTER SUCT 1200ML W/VALVE (MISCELLANEOUS) ×6 IMPLANT
CHLORAPREP W/TINT 26 (MISCELLANEOUS) ×6 IMPLANT
CNTNR SPEC 2.5X3XGRAD LEK (MISCELLANEOUS) ×1
CONT SPEC 4OZ STER OR WHT (MISCELLANEOUS) ×2
CONTAINER SPEC 2.5X3XGRAD LEK (MISCELLANEOUS) ×1 IMPLANT
COUNTER NEEDLE 20/40 LG (NEEDLE) ×3 IMPLANT
COVER LIGHT HANDLE STERIS (MISCELLANEOUS) ×6 IMPLANT
COVER WAND RF STERILE (DRAPES) ×3 IMPLANT
CUP MEDICINE 2OZ PLAST GRAD ST (MISCELLANEOUS) ×6 IMPLANT
DERMABOND ADVANCED (GAUZE/BANDAGES/DRESSINGS) ×2
DERMABOND ADVANCED .7 DNX12 (GAUZE/BANDAGES/DRESSINGS) ×1 IMPLANT
DRAPE C-ARM 42X72 X-RAY (DRAPES) ×6 IMPLANT
DRAPE LAPAROTOMY 100X77 ABD (DRAPES) ×3 IMPLANT
DRAPE MICROSCOPE SPINE 48X150 (DRAPES) ×3 IMPLANT
DRAPE SURG 17X11 SM STRL (DRAPES) ×12 IMPLANT
ELECT CAUTERY BLADE TIP 2.5 (TIP) ×3
ELECT EZSTD 165MM 6.5IN (MISCELLANEOUS) ×3
ELECTRODE CAUTERY BLDE TIP 2.5 (TIP) ×1 IMPLANT
ELECTRODE EZSTD 165MM 6.5IN (MISCELLANEOUS) ×1 IMPLANT
FRAME EYE SHIELD (PROTECTIVE WEAR) ×3 IMPLANT
GLOVE BIOGEL PI IND STRL 7.0 (GLOVE) ×1 IMPLANT
GLOVE BIOGEL PI INDICATOR 7.0 (GLOVE) ×2
GLOVE SURG SYN 7.0 (GLOVE) ×6 IMPLANT
GLOVE SURG SYN 8.5  E (GLOVE) ×6
GLOVE SURG SYN 8.5 E (GLOVE) ×3 IMPLANT
GOWN SRG XL LVL 3 NONREINFORCE (GOWNS) ×1 IMPLANT
GOWN STRL NON-REIN TWL XL LVL3 (GOWNS) ×3
GOWN STRL REUS W/ TWL XL LVL3 (GOWN DISPOSABLE) ×1 IMPLANT
GOWN STRL REUS W/TWL MED LVL3 (GOWN DISPOSABLE) ×3 IMPLANT
GOWN STRL REUS W/TWL XL LVL3 (GOWN DISPOSABLE) ×3
GRADUATE 1200CC STRL 31836 (MISCELLANEOUS) ×3 IMPLANT
KIT SPINAL PRONEVIEW (KITS) ×3 IMPLANT
KNIFE BAYONET SHORT DISCETOMY (MISCELLANEOUS) IMPLANT
MARKER SKIN DUAL TIP RULER LAB (MISCELLANEOUS) ×3 IMPLANT
NDL SAFETY ECLIPSE 18X1.5 (NEEDLE) IMPLANT
NEEDLE HYPO 18GX1.5 SHARP (NEEDLE)
NEEDLE HYPO 22GX1.5 SAFETY (NEEDLE) ×3 IMPLANT
NS IRRIG 1000ML POUR BTL (IV SOLUTION) ×3 IMPLANT
PACK LAMINECTOMY NEURO (CUSTOM PROCEDURE TRAY) ×3 IMPLANT
PAD ARMBOARD 7.5X6 YLW CONV (MISCELLANEOUS) ×3 IMPLANT
SET WALTER ACTIVATION W/DRAPE (SET/KITS/TRAYS/PACK) ×3 IMPLANT
SPOGE SURGIFLO 8M (HEMOSTASIS) ×3
SPONGE SURGIFLO 8M (HEMOSTASIS) ×1 IMPLANT
SUT DVC VLOC 3-0 CL 6 P-12 (SUTURE) ×3 IMPLANT
SUT VIC AB 0 CT1 27 (SUTURE) ×4
SUT VIC AB 0 CT1 27XCR 8 STRN (SUTURE) ×2 IMPLANT
SUT VIC AB 2-0 CT1 18 (SUTURE) ×3 IMPLANT
SYR 20ML LL LF (SYRINGE) ×3 IMPLANT
SYR 30ML LL (SYRINGE) ×6 IMPLANT
SYR 3ML LL SCALE MARK (SYRINGE) ×3 IMPLANT
TOWEL OR 17X26 4PK STRL BLUE (TOWEL DISPOSABLE) ×9 IMPLANT
TUBE MATRX SPINL 18MM 6CM DISP (INSTRUMENTS) ×3
TUBE METRX SPINAL 18X6 DISP (INSTRUMENTS) ×1 IMPLANT
TUBING CONNECTING 10 (TUBING) ×2 IMPLANT
TUBING CONNECTING 10' (TUBING) ×1

## 2020-04-08 NOTE — H&P (Signed)
History of Present Illness: 04/08/2020 [name] presents today for surgical intervention.  He is still symptomatic.  03/04/2020 [name] is here today with a chief complaint of low back pain with radiation to the bilateral buttock and down the back of his legs to the bottom of his feet and to his big toe (left worse than right). He reports a fall through the ceiling where he landed on his back in 2006.  Over the past 3 to 4 years, he has been having worsening pain that is sharp and stabbing that is mostly down the back of his legs bilaterally but also sometimes goes to the front of his thighs. This happens after he walks or stands for any length of time. He can now only walk approximately 1/4 mile before he has incapacitating pain. He previously could walk 2 miles or more a day. Sitting or laying down make his pain better. Standing walking make it worse. He denies weakness or bowel bladder dysfunction.  He has been doing physical therapy but his symptoms have been worsening. He is also tried injections without improvement.  Conservative measures:  Physical therapy: currently participating Multimodal medical therapy including regular antiinflammatories: tylenol, gabapentin, norco, methocarbamol, prednisone, percocet Injections: has received epidural steroid injections 01/20/2020: Left L5-S1 and left S1 transforaminal ESI(no relief dexamethasone 8 mg) 12/17/2019: Left L5-S1 and left S1 transforaminal ESI(good relief x3 weeks, blood sugars elevated to almost 400 dexamethasone 10 mg) 10/21/2019: Bilateral L5 trigger point injections (mild relief) 07/22/2019: Bilateral L5 trigger point injections (moderate relief) 06/24/2019: Bilateral L5-S1 transforaminal ESI (1 day of relief) 04/01/2019: Bilateral trapezius ridge ridge trigger point injections (moderate relief) 12/31/2018: Bilateral L5 trigger point injections (good relief) 10/08/2018: Bilateral L5 and right trapezius ridge trigger point  injection (good relief times approximately 12 weeks) 07/09/2018: Bilateral L5 trigger point injection 03/19/2018: Bilateral L5 trigger point injection (good relief x3 months) 11/07/2017: Bilateral L5 trigger point injection (relief x3 months) 06/20/2017: Bilateral L5 trigger point injection (good relief times 3 months) 01/17/2017: Bilateral L5 trigger point injection (good relief 3 months) 04/28/2015: Bilateral L5-S1 transforaminal ESI (good relief 5-6 months) 04/07/2015: Bilateral L5-S1 transforaminal ESI 11/10/2014: Right shoulder injection (good relief) 05/05/2014: Right C5 trigger-point injection  Past Surgery: denies  Kent [name] has no symptoms of cervical myelopathy.  The symptoms are causing a significant impact on the patient's life.   Review of Systems:  A 10 point review of systems is negative, except for the pertinent positives and negatives detailed in the HPI.  Past Medical History: Past Medical History:  Diagnosis Date  . Anemia, unspecified  . CAD (coronary artery disease)  . Cervicalgia  . Colon polyps  . Dysphagia 05/31/2018  . Essential hypertension, benign  . GERD (gastroesophageal reflux disease)  . Myocardial infarct (CMS-HCC) 03/29/13  NonST ELEVATION-WITH 3.5 BY 18MM XIENCE STENT LEFT CIRCUMFLEX(AP)  . Neuropathy  B/L LEG TRAUMA POST FALL  . Osteoarthritis  HANDS/CHRONIC PREDNISONE  . Other and unspecified hyperlipidemia  . Panic attacks  . Reflux esophagitis  . Type II or unspecified type diabetes mellitus without mention of complication, not stated as uncontrolled (CMS-HCC)   Past Surgical History: Past Surgical History:  Procedure Laterality Date  . COLONOSCOPY 11/30/2004  Dr. Keturah Barre. Wohl @ [facility]  . COLONOSCOPY 08/20/2012  Dr. Jerilynn Mages. [facility] @ [facility] - Hyperplastic Polyps, PHPolyps, rpt 5 yrs per provider: See msg 06-05-18: Recall ltr mailed  . CORONARY ARTERY BYPASS GRAFT  . ENDOSCOPIC CARPAL TUNNEL RELEASE Bilateral  . HARVEST  RADIAL  ARTERY FOR CABG 1996  STATUS POST WITH LIMA TO LAD (AP)  . LEG FRACTURES Bilateral 02/2005  TITANIUM RODS  . XIENCE Right  STATUS POST STENT MID RIGHT CORONARY ARTERY(AP)    Current Meds  Medication Sig  . ALPRAZolam (XANAX) 0.25 MG tablet Take 0.125 mg by mouth 3 (three) times daily as needed for anxiety.  Marland Kitchen aspirin EC 81 MG tablet Take 81 mg by mouth at bedtime.  Marland Kitchen atorvastatin (LIPITOR) 80 MG tablet Take 1 tablet (80 mg total) by mouth daily. (Patient taking differently: Take 40 mg by mouth daily. )  . carvedilol (COREG) 25 MG tablet Take 25 mg by mouth in the morning, at noon, and at bedtime.   . clopidogrel (PLAVIX) 75 MG tablet Take 75 mg by mouth daily.  . ferrous sulfate 325 (65 FE) MG tablet Take 325 mg by mouth 2 (two) times daily with a meal.  . gabapentin (NEURONTIN) 300 MG capsule Take 300 mg by mouth at bedtime.   Marland Kitchen HYDROcodone-acetaminophen (NORCO) 7.5-325 MG tablet Take 0.5-1 tablets by mouth See admin instructions. Take 1/2 tablet by mouth twice daily and 1 tablet at bedtime  . ibuprofen (ADVIL,MOTRIN) 200 MG tablet Take 200-400 mg by mouth See admin instructions. Take 400 mg by mouth in the morning, take 200 mg at lunch and take 200 mg in the evening  . isosorbide mononitrate (IMDUR) 30 MG 24 hr tablet Take 30 mg by mouth at bedtime.   Marland Kitchen latanoprost (XALATAN) 0.005 % ophthalmic solution Place 1 drop into both eyes at bedtime.  Marland Kitchen lisinopril (PRINIVIL,ZESTRIL) 20 MG tablet Take 20 mg by mouth daily.  . Magnesium 250 MG TABS Take 500 mg by mouth daily with lunch.   . metFORMIN (GLUCOPHAGE) 500 MG tablet Take 500 mg by mouth 3 (three) times daily with meals.   . methocarbamol (ROBAXIN) 500 MG tablet Take 500 mg by mouth at bedtime.  Marland Kitchen omeprazole (PRILOSEC) 20 MG capsule Take 20 mg by mouth daily.  Marland Kitchen PARoxetine (PAXIL) 20 MG tablet Take 20 mg by mouth daily.    Allergies  Allergen Reactions  . Celebrex [Celecoxib] Other (See Comments)    GI bleed  . Nsaids     GI Bleed.   Takes ibuprofen daily with food without problems  . Oxycodone Other (See Comments)    Hallucinations and agitations Migraines occur   . Prednisone Other (See Comments)    Can't take high doses.(30 mg max) Sets off cardiac irregularities  . Zoloft [Sertraline Hcl] Other (See Comments)    Crazy  . Skin Adhesives [Cyanoacrylate]     Causes skin to itch even if on for a short time. Paper Tape is OK    No facility-administered encounter medications on file as of 03/05/2020.   Social History: Social History   Tobacco Use  . Smoking status: Former Smoker  Quit date: 03/26/1994  Years since quitting: 25.9  . Smokeless tobacco: Never Used  Substance Use Topics  . Alcohol use: No  Alcohol/week: 0.0 standard drinks  . Drug use: Not on file   Family Medical History: Family History  Problem Relation Age of Onset  . Osteoporosis (Thinning of bones) Father  . High blood pressure (Hypertension) Father  . Parkinsonism Father  . Rheum arthritis Other  Aunt  . Diabetes Other  Great aunt   Physical Examination:  Vitals:   04/08/20 0837  BP: 128/86  Pulse: 66  Resp: 18  Temp: 98 F (36.7 C)  SpO2: 98%  Heart sounds normal no MRG. Chest Clear to Auscultation Bilaterally.  General: Patient is well developed, well nourished, calm, collected, and in no apparent distress. Attention to examination is appropriate.  Psychiatric: Patient is non-anxious.  Head: Pupils equal, round, and reactive to light.  ENT: Oral mucosa appears well hydrated.  Neck: Supple. Full range of motion.  Respiratory: Patient is breathing without any difficulty.  Extremities: No edema.  Vascular: Palpable dorsal pedal pulses.  Skin: On exposed skin, there are no abnormal skin lesions.  NEUROLOGICAL:   Awake, alert, oriented to person, place, and time. Speech is clear and fluent. Fund of knowledge is appropriate.   Cranial Nerves: Pupils equal round and reactive to light. Facial tone is symmetric.  Facial sensation is symmetric. Shoulder shrug is symmetric. Tongue protrusion is midline. There is no pronator drift.  ROM of spine: full. Palpation of spine: non tender.   Strength: Side Biceps Triceps Deltoid Interossei Grip Wrist Ext. Wrist Flex.  R 5 5 5 5 5 5 5   L 5 5 5 5 5 5 5    Side Iliopsoas Quads Hamstring PF DF EHL  R 5 5 5 5 5 5   L 5 5 5 5 5 5    Reflexes are 1+ and symmetric at the biceps, triceps, brachioradialis, patella and achilles. Hoffman's is absent. Clonus is not present. Toes are down-going.  Bilateral upper and lower extremity sensation is intact to light touch.  Gait is abnormal - requires cane due to leg injuries. No evidence of dysmetria noted.  Medical Decision Making  Imaging: MRI L spine 02/28/2020 L2-3: Disc bulge, facet degenerative change ligamentum flavum  redundancy which in association with prominence of the posterior  epidural fat results in moderate narrowing of the thecal sac and  mild bilateral neural foraminalnarrowing. This has progressed since  prior MRI.   L3-4: Disc bulge, facet degenerative changes and ligamentum flavum  redundancy resulting in moderate spinal canal stenosis and mild  bilateral neural foraminal narrowing. This has progressed since  prior MRI.   L4-5: Disc bulge, prominent facet degenerative changes and  ligamentum flavum redundancy resulting in severe spinal canal  stenosis, moderate right and mild left neural foraminal narrowing a  a. This has also progressed since prior MRI.   IMPRESSION:  1. Multilevel degenerative changes of the lumbar spine with severe  spinal canal stenosis at L4-5 and moderate spinal canal stenosis at  L2-3 and L3-4. Findings have progressed since prior MRI.  2. Moderate right and mild left neural foraminal narrowing at L4-5  and mild bilateral neural foraminal narrowing at L2-3 and L3-4.   Electronically Signed  By: Pedro Earls M.D.  On: 02/28/2020 09:52  I have  personally reviewed the images and agree with the above interpretation.  Assessment and Plan: Mr. Hearn is a pleasant 64 y.o. 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 This is worst at L4-5, but also progressed at L3-4 and L2-3. He has tried injections as well as physical therapy and has had worsening symptoms over time. I recommended surgical intervention with L2-5 decompression.    Meade Maw MD, [facility] Department of Neurosurgery

## 2020-04-08 NOTE — Anesthesia Procedure Notes (Signed)
Procedure Name: Intubation Date/Time: 04/08/2020 10:03 AM Performed by: Lia Foyer, CRNA Pre-anesthesia Checklist: Patient identified, Emergency Drugs available, Suction available and Patient being monitored Patient Re-evaluated:Patient Re-evaluated prior to induction Oxygen Delivery Method: Circle system utilized Preoxygenation: Pre-oxygenation with 100% oxygen Induction Type: IV induction Ventilation: Mask ventilation without difficulty Laryngoscope Size: McGraph and 4 Grade View: Grade I Tube type: Oral Tube size: 7.5 mm Number of attempts: 1 Airway Equipment and Method: Stylet,  Oral airway and Video-laryngoscopy Placement Confirmation: ETT inserted through vocal cords under direct vision,  positive ETCO2 and breath sounds checked- equal and bilateral Secured at: 22 cm Tube secured with: Tape Dental Injury: Teeth and Oropharynx as per pre-operative assessment

## 2020-04-08 NOTE — Progress Notes (Signed)
Procedure: L2-5 decompression Procedure date: 04/08/2020 Diagnosis: lumbar radiculopathy   History: Kent [name] is s/p L2-5 decompression  POD0: Tolerated procedure well. Evaluated in post op recovery still disoriented from anesthesia but able to answer questions and obey commands.   Physical Exam: Vitals:   04/08/20 1556 04/08/20 1705  BP: 126/87 (!) 143/89  Pulse: 66 62  Resp: 17 16  Temp: 97.9 F (36.6 C) 97.6 F (36.4 C)  SpO2: 95% 96%    General: Alert and oriented, lying in bed Strength:5/5 throughout Sensation: intact and symmetric throughout  Skin: incision clean, dry, intact  Data:  No results for input(s): NA, K, CL, CO2, BUN, CREATININE, LABGLOM, GLUCOSE, CALCIUM in the last 168 hours. No results for input(s): AST, ALT, ALKPHOS in the last 168 hours.  Invalid input(s): TBILI   No results for input(s): WBC, HGB, HCT, PLT in the last 168 hours. No results for input(s): APTT, INR in the last 168 hours.         Assessment/Plan:  Kent [name] is POD0 s/p L2-5 decompression. Will continue to monitor, pt to be admitted to postoperative floor for overnight monitoring.  - Ambulate as tolerated - Pain management - Monitor blood sugars, BP - Foley removal tomorrow morning   Lonell Face, NP Department of Neurosurgery

## 2020-04-08 NOTE — Op Note (Signed)
Indications: 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  He failed conservative management and elected for surgical intervention.  Findings: severe stenosis L2-5  Preoperative Diagnosis: Lumbar Stenosis with neurogenic claudication Postoperative Diagnosis: same   EBL: 100 ml IVF: 900 ml Drains: none Disposition: Extubated and Stable to PACU Complications: none  A foley catheter was placed.   Preoperative Note:   Risks of surgery discussed include: infection, bleeding, stroke, coma, death, paralysis, CSF leak, nerve/spinal cord injury, numbness, tingling, weakness, complex regional pain syndrome, recurrent stenosis and/or disc herniation, vascular injury, development of instability, neck/back pain, need for further surgery, persistent symptoms, development of deformity, and the risks of anesthesia. The patient understood these risks and agreed to proceed.  Operative Note:   1. L2-5 lumbar decompression including central laminectomy and bilateral medial facetectomies including foraminotomies  The patient was then brought from the preoperative center with intravenous access established.  The patient underwent general anesthesia and endotracheal tube intubation, and was then rotated on the [place] rail top where all pressure points were appropriately padded.  The skin was then thoroughly cleansed.  Perioperative antibiotic prophylaxis was administered.  Sterile prep and drapes were then applied and a timeout was then observed.  C-arm was brought into the field under sterile conditions and under lateral visualization the L4-5 interspace was identified and marked.  The incision was marked on the left and injected with local anesthetic. Once this was complete a 6 cm incision was opened with the use of a #10 blade knife.    The metrx tubes were sequentially advanced and confirmed in position at L4-5. An 80mm by 39mm tube was locked in place to the  bed side attachment.  The microscope was then sterilely brought into the field and muscle creep was hemostased with a bipolar and resected with a pituitary rongeur.  A Bovie extender was then used to expose the spinous process and lamina.  Careful attention was placed to not violate the facet capsule. A 3 mm matchstick drill bit was then used to make a hemi-laminotomy trough until the ligamentum flavum was exposed.  This was extended to the base of the spinous process and to the contralateral side to remove all the central bone from each side.  Once this was complete and the underlying ligamentum flavum was visualized, it was dissected with a curette and resected with Kerrison rongeurs.  Extensive ligamentum hypertrophy was noted, requiring a substantial amount of time and care for removal.  The dura was identified and palpated. The kerrison rongeur was then used to remove the medial facet bilaterally until no compression was noted.  A balltip probe was used to confirm decompression of the ipsilateral L5 nerve root.  Additional attention was paid to completion of the contralateral L4-5 foraminotomy until the contralateral traversing nerve root was completely free.  Once this was complete, L4-5 central decompression including medial facetectomy and foraminotomy was confirmed and decompression on both sides was confirmed. No CSF leak was noted.  A Depo-Medrol soaked Gelfoam pledget was placed in the defect.  The wound was copiously irrigated. The tube system was then removed under microscopic visualization and hemostasis was obtained with a bipolar.    After performing the decompression at L4-5, the metrx tubes were sequentially advanced and confirmed in position at L3-4. An 59mm by 71mm tube was locked in place to the bed side attachment.  Fluoroscopy was then removed from the field.  The microscope was then sterilely  brought into the field and muscle creep was hemostased with a bipolar and resected with a  pituitary rongeur.  A Bovie extender was then used to expose the spinous process and lamina.  Careful attention was placed to not violate the facet capsule. A 3 mm matchstick drill bit was then used to make a hemi-laminotomy trough until the ligamentum flavum was exposed.  This was extended to the base of the spinous process and to the contralateral side to remove all the central bone from each side.  Once this was complete and the underlying ligamentum flavum was visualized, it was dissected with a curette and resected with Kerrison rongeurs.  Extensive ligamentum hypertrophy was noted, requiring a substantial amount of time and care for removal.  The dura was identified and palpated. The kerrison rongeur was then used to remove the medial facet bilaterally until no compression was noted.  A balltip probe was used to confirm decompression of the ipsilateral L4 nerve root.  Additional attention was paid to completion of the contralateral foraminotomy until the contralateral L4 nerve root was completely free.  Once this was complete, L3-4 central decompression including medial facetectomy and foraminotomy was confirmed and decompression on both sides was confirmed. No CSF leak was noted.  A Depo-Medrol soaked Gelfoam pledget was placed in the defect.  The wound was copiously irrigated. The tube system was then removed under microscopic visualization and hemostasis was obtained with a bipolar.    After performing the decompression at L3-4, the metrx tubes were sequentially advanced and confirmed in position at L2-3. An 74mm by 66mm tube was locked in place to the bed side attachment.  Fluoroscopy was then removed from the field.  The microscope was then sterilely brought into the field and muscle creep was hemostased with a bipolar and resected with a pituitary rongeur.  A Bovie extender was then used to expose the spinous process and lamina.  Careful attention was placed to not violate the facet capsule. A 3 mm  matchstick drill bit was then used to make a hemi-laminotomy trough until the ligamentum flavum was exposed.  This was extended to the base of the spinous process and to the contralateral side to remove all the central bone from each side.  Once this was complete and the underlying ligamentum flavum was visualized, it was dissected with a curette and resected with Kerrison rongeurs.  Extensive ligamentum hypertrophy was noted, requiring a substantial amount of time and care for removal.  The dura was identified and palpated. The kerrison rongeur was then used to remove the medial facet bilaterally until no compression was noted.  A balltip probe was used to confirm decompression of the ipsilateral L3 nerve root.  Additional attention was paid to completion of the contralateral foraminotomy until the contralateral L3 nerve root was completely free.  Once this was complete, L2-3 central decompression including medial facetectomy and foraminotomy was confirmed and decompression on both sides was confirmed. No CSF leak was noted.  A Depo-Medrol soaked Gelfoam pledget was placed in the defect.  The wound was copiously irrigated. The tube system was then removed under microscopic visualization and hemostasis was obtained with a bipolar.    The fascial layer was reapproximated with the use of a 0 Vicryl suture.  Subcutaneous tissue layer was reapproximated using 2-0 Vicryl suture.  3-0 monocryl was placed in subcuticular fashion. The skin was then cleansed and Dermabond was used to close the skin opening.  Patient was then rotated back to the preoperative  bed awakened from anesthesia and taken to recovery all counts are correct in this case.  I performed the entire procedure with the assistance of Lonell Face NP as an Pensions consultant.  Tc Kapusta K. Izora Ribas MD

## 2020-04-08 NOTE — Anesthesia Postprocedure Evaluation (Signed)
Anesthesia Post Note  Patient: Kent [name]  Procedure(s) Performed: L2-5 DECOMPRESSION (N/A )  Patient location during evaluation: PACU Anesthesia Type: General Level of consciousness: awake and alert Pain management: pain level controlled Vital Signs Assessment: post-procedure vital signs reviewed and stable Respiratory status: spontaneous breathing and respiratory function stable Cardiovascular status: stable Anesthetic complications: no     Last Vitals:  Vitals:   04/08/20 1457 04/08/20 1525  BP: 138/85 134/86  Pulse: 71 69  Resp: 16 17  Temp: (!) 36.1 C 36.6 C  SpO2: 99% 94%    Last Pain:  Vitals:   04/08/20 1525  TempSrc: Oral  PainSc: 4                  Seddrick Flax K

## 2020-04-08 NOTE — Anesthesia Preprocedure Evaluation (Addendum)
Anesthesia Evaluation  Patient identified by MRN, date of birth, ID band Patient awake    Reviewed: Allergy & Precautions, NPO status , Patient's Chart, lab work & pertinent test results, reviewed documented beta blocker date and time   History of Anesthesia Complications Negative for: history of anesthetic complications  Airway Mallampati: III       Dental  (+) Edentulous Upper, Missing, Poor Dentition, Chipped   Pulmonary sleep apnea and Continuous Positive Airway Pressure Ventilation , neg COPD, Not current smoker, former smoker,           Cardiovascular hypertension, Pt. on medications and Pt. on home beta blockers + Past MI and + CABG  (-) CHF (-) dysrhythmias (-) Valvular Problems/Murmurs     Neuro/Psych neg Seizures Anxiety Depression    GI/Hepatic Neg liver ROS, GERD  Medicated and Controlled,  Endo/Other  diabetes, Type 2, Oral Hypoglycemic Agents  Renal/GU negative Renal ROS     Musculoskeletal   Abdominal   Peds  Hematology   Anesthesia Other Findings   Reproductive/Obstetrics                            Anesthesia Physical Anesthesia Plan  ASA: III  Anesthesia Plan: General   Post-op Pain Management:    Induction: Intravenous  PONV Risk Score and Plan: 2 and Ondansetron and Dexamethasone  Airway Management Planned: Oral ETT  Additional Equipment:   Intra-op Plan:   Post-operative Plan:   Informed Consent: I have reviewed the patients History and Physical, chart, labs and discussed the procedure including the risks, benefits and alternatives for the proposed anesthesia with the patient or authorized representative who has indicated his/her understanding and acceptance.       Plan Discussed with:   Anesthesia Plan Comments:         Anesthesia Quick Evaluation

## 2020-04-08 NOTE — Transfer of Care (Signed)
Immediate Anesthesia Transfer of Care Note  Patient: Kent [name]  Procedure(s) Performed: L2-5 DECOMPRESSION (N/A )  Patient Location: PACU  Anesthesia Type:General  Level of Consciousness: drowsy  Airway & Oxygen Therapy: Patient Spontanous Breathing and Patient connected to face mask oxygen  Post-op Assessment: Report given to RN and Post -op Vital signs reviewed and stable  Post vital signs: Reviewed and stable  Last Vitals:  Vitals Value Taken Time  BP 129/86 04/08/20 1342  Temp 36.2 C 04/08/20 1342  Pulse 72 04/08/20 1345  Resp 11 04/08/20 1345  SpO2 100 % 04/08/20 1345  Vitals shown include unvalidated device data.  Last Pain:  Vitals:   04/08/20 0837  TempSrc: Temporal  PainSc: 8          Complications: No apparent anesthesia complications

## 2020-04-08 NOTE — Consult Note (Addendum)
Pharmacy Antibiotic Note   Kent [name] is a 64 y.o. male admitted on 04/08/2020 for planned surgical procedure.  Pharmacy has been consulted for cefazolin dosing.  Plan: Administer cefazolin 2g IV 70mins prior to procedure.      No data recorded.  Recent Labs  Lab 04/01/20 1028  WBC 5.5  CREATININE 0.96    Estimated Creatinine Clearance: 94.4 mL/min (by C-G formula based on SCr of 0.96 mg/dL).    Allergies  Allergen Reactions  . Celebrex [Celecoxib] Other (See Comments)    GI bleed  . Nsaids     GI Bleed.  Takes ibuprofen daily with food without problems  . Oxycodone Other (See Comments)    Hallucinations and agitations Migraines occur   . Prednisone Other (See Comments)    Can't take high doses.(30 mg max) Sets off cardiac irregularities  . Zoloft [Sertraline Hcl] Other (See Comments)    Crazy  . Skin Adhesives [Cyanoacrylate]     Causes skin to itch even if on for a short time. Paper Tape is OK    Antimicrobials this admission: 6/9 cefazolin x1 dose   Thank you for allowing pharmacy to be a part of this patient's care.  Durward Fortes  PharmD-Student  04/08/2020 8:33 AM

## 2020-04-09 ENCOUNTER — Encounter: Payer: Self-pay | Admitting: Neurosurgery

## 2020-04-09 DIAGNOSIS — M48062 Spinal stenosis, lumbar region with neurogenic claudication: Secondary | ICD-10-CM | POA: Diagnosis not present

## 2020-04-09 LAB — TYPE AND SCREEN
ABO/RH(D): O POS
Antibody Screen: POSITIVE
PT AG Type: POSITIVE
Unit division: 0
Unit division: 0

## 2020-04-09 LAB — BPAM RBC
Blood Product Expiration Date: 202107042359
Blood Product Expiration Date: 202107052359
Unit Type and Rh: 5100
Unit Type and Rh: 5100

## 2020-04-09 LAB — GLUCOSE, CAPILLARY
Glucose-Capillary: 143 mg/dL — ABNORMAL HIGH (ref 70–99)
Glucose-Capillary: 130 mg/dL — ABNORMAL HIGH (ref 70–99)
Glucose-Capillary: 119 mg/dL — ABNORMAL HIGH (ref 70–99)

## 2020-04-09 MED ORDER — METHOCARBAMOL 500 MG PO TABS: 500.0000 mg | ORAL_TABLET | Freq: Four times a day (QID) | ORAL | 0 refills | Status: AC | PRN

## 2020-04-09 MED ORDER — HYDROCODONE-ACETAMINOPHEN 5-325 MG PO TABS: 1.0000 | ORAL_TABLET | Freq: Four times a day (QID) | ORAL | 0 refills | Status: AC | PRN

## 2020-04-09 MED ORDER — POLYETHYLENE GLYCOL 3350 17 G PO PACK: 17.0000 g | PACK | Freq: Every day | ORAL | 0 refills | Status: AC | PRN

## 2020-04-09 MED ORDER — CHLORHEXIDINE GLUCONATE CLOTH 2 % EX PADS: 6.0000 | MEDICATED_PAD | Freq: Every day | CUTANEOUS | Status: DC

## 2020-04-09 MED ORDER — SENNA 8.6 MG PO TABS: 1.0000 | ORAL_TABLET | Freq: Two times a day (BID) | ORAL | 0 refills | Status: AC

## 2020-04-09 NOTE — Evaluation (Signed)
Physical Therapy Evaluation Patient Details Name: Orval Dortch MRN: [id number] DOB: Nov 30, 1955 Today's Date: 04/09/2020   History of Present Illness  admitted for acute hospitalization s/p L2-5 lumbar decompression due to neurogenic claudication secondary to lumbar stenosis.  Clinical Impression  Patient awake, alert and oriented upon arrival to room; eager for OOB activities.  Rates pain in low back 6-7/10; meds requested and administered per RN during session.  Bilat UE/LE ROM grossly symmetrical and WFL; mild baseline weakness in L LE (4+/5) compared to R LE (5/5), denies sensory deficit/paresthesia (improved from pre-surgery).  Able to complete bed mobility with mod indep; sit/stand, basic transfers and gait (200') with RW, cga/close sup; up/down 4 steps (x2) with bilat/single UE, cga/close sup.  Gait efforts demonstrate reciprocal stepping pattern with fair step height/length; mild forward head/trunk posturing (corrects temporarily with verbal cuing). Fair cadence, gait speed (10' walk time, 10-11 seconds) without buckling, LOB or safety concern.  Do recommend continued use of RW (patient in agreement) for optimal safety/stability with mobility. Would benefit from skilled PT to address above deficits and promote optimal return to PLOF.; Recommend transition to HHPT upon discharge from acute hospitalization.     Follow Up Recommendations Home health PT    Equipment Recommendations   (has necessary equip)    Recommendations for Other Services       Precautions / Restrictions Precautions Precautions: Fall;Back Precaution Booklet Issued: Yes (comment) Precaution Comments: handout issued Restrictions Weight Bearing Restrictions: No      Mobility  Bed Mobility Overal bed mobility: Needs Assistance Bed Mobility: Supine to Sit     Supine to sit: Supervision     General bed mobility comments: educated in log rolling technique; good return demonstration  Transfers Overall  transfer level: Needs assistance Equipment used: Rolling walker (2 wheeled) Transfers: Sit to/from Stand Sit to Stand: Min guard;Supervision         General transfer comment: good hand placement, good LE strength; slightly slow and guarded, but no buckling or LOB  Ambulation/Gait Ambulation/Gait assistance: Min guard;Supervision Gait Distance (Feet): 200 Feet Assistive device: Rolling walker (2 wheeled)   Gait velocity: 10' walk time, 10-11 seconds Gait velocity interpretation: <1.31 ft/sec, indicative of household ambulator General Gait Details: reciprocal stepping pattern with fair step height/length; mild forward head/trunk posturing (corrects temporarily with verbal cuing). Fair cadence, gait speed without buckling, LOB or safety concern.  Do recommend continued use of RW (patient in agreement) for optimal safety/stability with mobility.  Stairs Stairs: Yes Stairs assistance: Min guard;Supervision     General stair comments: 4 steps with bilat rails, cga/close sup; 4 steps with bilat UEs on R ascendign rail, cga/min assist.  Step to technique throughout both trials; no buckling, LOB.  good LE strength/control throughout  Engineer, building services [place] (Stroke Patients Only)       Balance Overall balance assessment: Needs assistance Sitting-balance support: No upper extremity supported;Feet supported Sitting balance-Leahy Scale: Good     Standing balance support: Bilateral upper extremity supported Standing balance-Leahy Scale: Fair                               Pertinent Vitals/Pain Pain Assessment: 0-10 Pain Score: 6  Pain Location: back Pain Descriptors / Indicators: Aching;Guarding;Grimacing Pain Intervention(s): Limited activity within patient's tolerance;Monitored during session;Repositioned;Patient requesting pain meds-RN notified;RN gave pain meds during session    Home Living Family/patient expects to be discharged to:: Private  residence Living  Arrangements: Spouse/significant other Available Help at Discharge: Family;Available 24 hours/day Type of Home: House Home Access: Stairs to enter Entrance Stairs-Rails: Psychiatric nurse of Steps: 3 Home Layout: Two level;Able to live on main level with bedroom/bathroom Home Equipment: Gilford Rile - 2 wheels;Cane - single point      Prior Function Level of Independence: Independent         Comments: Mod indep with ADLs, household and community mobilization; intermittent use of SPC for mobility efforts as appropriate.  Denies recent fall history.     Hand Dominance        Extremity/Trunk Assessment   Upper Extremity Assessment Upper Extremity Assessment: Overall WFL for tasks assessed    Lower Extremity Assessment Lower Extremity Assessment: Overall WFL for tasks assessed (grossly 5/5 R LE, 4+/5 L LE; denies numbness/paresthesia)       Communication   Communication: No difficulties  Cognition Arousal/Alertness: Awake/alert Behavior During Therapy: WFL for tasks assessed/performed Overall Cognitive Status: Within Functional Limits for tasks assessed                                        General Comments      Exercises Other Exercises Other Exercises: Educated in role of PT and progressive mobility in acute setting; reviewed back precautions (verbally and provided handout) and car transfer techniques. Patient voiced understanding of all information.   Assessment/Plan    PT Assessment Patient needs continued PT services  PT Problem List Decreased activity tolerance;Decreased balance;Decreased mobility;Pain;Decreased knowledge of use of DME;Decreased safety awareness;Decreased knowledge of precautions       PT Treatment Interventions DME instruction;Balance training;Gait training;Stair training;Functional mobility training;Therapeutic activities;Therapeutic exercise;Patient/family education    PT Goals (Current goals  can be found in the Care Plan section)  Acute Rehab PT Goals Patient Stated Goal: to return home and get a good night's rest PT Goal Formulation: With patient Time For Goal Achievement: 04/23/20 Potential to Achieve Goals: Good    Frequency 7X/week   Barriers to discharge        Co-evaluation               AM-PAC PT "6 Clicks" Mobility  Outcome Measure Help needed turning from your back to your side while in a flat bed without using bedrails?: None Help needed moving from lying on your back to sitting on the side of a flat bed without using bedrails?: None Help needed moving to and from a bed to a chair (including a wheelchair)?: None Help needed standing up from a chair using your arms (e.g., wheelchair or bedside chair)?: None Help needed to walk in hospital room?: None Help needed climbing 3-5 steps with a railing? : A Little 6 Click Score: 23    End of Session Equipment Utilized During Treatment: Gait belt Activity Tolerance: Patient tolerated treatment well Patient left: in chair;with call bell/phone within reach;with chair alarm set Nurse Communication: Mobility status PT Visit Diagnosis: Difficulty in walking, not elsewhere classified (R26.2);Pain    Time: 8144-8185 PT Time Calculation (min) (ACUTE ONLY): 27 min   Charges:   PT Evaluation $PT Eval Moderate Complexity: 1 Mod PT Treatments $Therapeutic Activity: 8-22 mins        Caldwell Kronenberger H. Owens Shark, PT, DPT, NCS 04/09/20, 11:10 AM [phone]

## 2020-04-09 NOTE — Care Management Obs Status (Signed)
[place] NOTIFICATION   Patient Details  Name: Kent [name] MRN: [id number] Date of Birth: 1956-06-14   Medicare Observation Status Notification Given:  Yes    Shelbie Ammons, RN 04/09/2020, 11:03 AM

## 2020-04-09 NOTE — TOC Transition Note (Signed)
Transition of Care [facility]) - CM/SW Discharge Note   Patient Details  Name: Kent [name] MRN: [id number] Date of Birth: 1956-08-20  [facility] CM/SW Contact:  Su Hilt, RN Phone Number: 04/09/2020, 12:58 PM   Clinical Narrative:    Met with the patient and his wife in the room, He has all the DME needed at home, He is set up with [facility] for Home health, no additional needs         Patient Goals and CMS Choice        Discharge Placement                       Discharge Plan and Services                                     Social Determinants of Health (SDOH) Interventions     Readmission Risk Interventions No flowsheet data found.

## 2020-04-09 NOTE — Discharge Summary (Signed)
Procedure: L2-5 decompression Procedure date: 04/18/2020 Diagnosis: Lumbar stenosis with neurogenic claudication   History: Kent [name] is s/p L2-5 decompression  POD1: Kent [name] is seen today. He reports that his pain has been moderate to severe overnight however with the addition of 10mg  of Vicodin he is feeling better this morning.   Physical Exam: Vitals:   04/09/20 0332 04/09/20 0738  BP: 127/76 110/70  Pulse: 63 61  Resp: 19   Temp: 98.4 F (36.9 C) 98 F (36.7 C)  SpO2: 99% 99%    General: Alert and oriented, lying in bed Strength:5/5 throughout  Sensation: intact and symmetric throughout  Skin: incision clean, dry, intact   Data:  No results for input(s): NA, K, CL, CO2, BUN, CREATININE, LABGLOM, GLUCOSE, CALCIUM in the last 168 hours. No results for input(s): AST, ALT, ALKPHOS in the last 168 hours.  Invalid input(s): TBILI   No results for input(s): WBC, HGB, HCT, PLT in the last 168 hours. No results for input(s): APTT, INR in the last 168 hours.       Assessment/Plan:  Kent [name] is POD1 s/p L2-5 decompression. He reports improved sensation to his lower extremities and is eager to ambulate. He is voiding.   Will order PT, once is evaluated and ambulates satisfactorily around the unit, he will be cleared to discharge home.   He should hold his Plavix for 2 weeks postoperatively. He can restart his ASA 81mg  today.   Lonell Face, NP  Department of Neurosurgery

## 2020-04-09 NOTE — Discharge Instructions (Addendum)
-PLEASE do not take Plavix until 04/23/2020 -You may take your baby Aspirin today -Please take off your dressing on Saturday, you can shower on Sunday  Your surgeon has performed an operation on your lumbar spine (low back) to relieve pressure on one or more nerves. Many times, patients feel better immediately after surgery and can "overdo it." Even if you feel well, it is important that you follow these activity guidelines. If you do not let your back heal properly from the surgery, you can increase the chance of a disc herniation and/or return of your symptoms. The following are instructions to help in your recovery once you have been discharged from the hospital.  * Do not take anti-inflammatory medications for 3 days after surgery (naproxen [Aleve], ibuprofen [Advil, Motrin], celecoxib [Celebrex], etc.)  Activity    No bending, lifting, or twisting ("BLT"). Avoid lifting objects heavier than 10 pounds (gallon milk jug).  Where possible, avoid household activities that involve lifting, bending, pushing, or pulling such as laundry, vacuuming, grocery shopping, and childcare. Try to arrange for help from friends and family for these activities while your back heals.  Increase physical activity slowly as tolerated.  Taking short walks is encouraged, but avoid strenuous exercise. Do not jog, run, bicycle, lift weights, or participate in any other exercises unless specifically allowed by your doctor. Avoid prolonged sitting, including car rides.  Talk to your doctor before resuming sexual activity.  You should not drive until cleared by your doctor.  Until released by your doctor, you should not return to work or school.  You should rest at home and let your body heal.   You may shower Sunday. After showering, lightly dab your incision dry. Do not take a tub bath or go swimming for 3 weeks, or until approved by your doctor at your follow-up appointment.  If you smoke, we strongly recommend that  you quit.  Smoking has been proven to interfere with normal healing in your back and will dramatically reduce the success rate of your surgery. Please contact [facility] (800-QUIT-NOW) and use the resources at www.[facility].com for assistance in stopping smoking.  Surgical Incision   If you have a dressing on your incision, you may remove it three days after your surgery. Keep your incision area clean and dry.  If you have staples or stitches on your incision, you should have a follow up scheduled for removal. If you do not have staples or stitches, you will have steri-strips (small pieces of surgical tape) or Dermabond glue. The steri-strips/glue should begin to peel away within about a week (it is fine if the steri-strips fall off before then). If the strips are still in place one week after your surgery, you may gently remove them.  Diet            You may return to your usual diet. Be sure to stay hydrated.  When to Contact us  Although your surgery and recovery will likely be uneventful, you may have some residual numbness, aches, and pains in your back and/or legs. This is normal and should improve in the next few weeks.  However, should you experience any of the following, contact us immediately: . New numbness or weakness . Pain that is progressively getting worse, and is not relieved by your pain medications or rest . Bleeding, redness, swelling, pain, or drainage from surgical incision . Chills or flu-like symptoms . Fever greater than 101.0 F (38.3 C) . Problems with bowel or bladder functions .  Difficulty breathing or shortness of breath . Warmth, tenderness, or swelling in your calf  Contact Information . During office hours (Monday-Friday 9 am to 5 pm), please call your physician at [phone] . After hours and weekends, please call [phone] and an answering service will put you in touch with either Dr. Lacinda Axon or Dr. Izora Ribas.  . For a life-threatening emergency, call  [phone]

## 2020-04-09 NOTE — Plan of Care (Signed)
Discharge instructions verbalized and given to patient a long with prescriptions.  Patient and wife deny any further questions at this time. PIVs removed and dressing w/ pressure applied.  Patient transported to main entrance via wheelchair by transporter and RN for d/c to home with wife, along with personal belongings.

## 2020-07-27 ENCOUNTER — Other Ambulatory Visit: Payer: Self-pay | Admitting: Physical Medicine and Rehabilitation

## 2020-07-27 DIAGNOSIS — M5416 Radiculopathy, lumbar region: Secondary | ICD-10-CM

## 2020-08-12 ENCOUNTER — Other Ambulatory Visit: Payer: Self-pay

## 2020-08-12 ENCOUNTER — Ambulatory Visit
Admission: RE | Admit: 2020-08-12 | Discharge: 2020-08-12 | Disposition: A | Discharge status: Home / Self Care | Payer: Medicare Other | Source: Ambulatory Visit | Attending: Physical Medicine and Rehabilitation | Admitting: Physical Medicine and Rehabilitation

## 2020-08-12 DIAGNOSIS — M5416 Radiculopathy, lumbar region: Secondary | ICD-10-CM | POA: Insufficient documentation

## 2023-10-03 ENCOUNTER — Encounter: Payer: Self-pay | Admitting: Gastroenterology

## 2023-10-30 ENCOUNTER — Encounter: Payer: Self-pay | Admitting: Gastroenterology

## 2023-11-06 ENCOUNTER — Ambulatory Visit: Payer: Medicare Other | Admitting: Anesthesiology

## 2023-11-06 ENCOUNTER — Encounter
Admission: RE | Disposition: A | Discharge status: Home / Self Care | Payer: Self-pay | Source: Home / Self Care | Attending: Gastroenterology

## 2023-11-06 ENCOUNTER — Other Ambulatory Visit: Payer: Self-pay

## 2023-11-06 ENCOUNTER — Ambulatory Visit
Admission: RE | Admit: 2023-11-06 | Discharge: 2023-11-06 | Disposition: A | Discharge status: Home / Self Care | Payer: Medicare Other | Attending: Gastroenterology | Admitting: Gastroenterology

## 2023-11-06 ENCOUNTER — Encounter: Payer: Self-pay | Admitting: Gastroenterology

## 2023-11-06 DIAGNOSIS — Z7902 Long term (current) use of antithrombotics/antiplatelets: Secondary | ICD-10-CM | POA: Diagnosis not present

## 2023-11-06 DIAGNOSIS — I2510 Atherosclerotic heart disease of native coronary artery without angina pectoris: Secondary | ICD-10-CM | POA: Diagnosis not present

## 2023-11-06 DIAGNOSIS — E1140 Type 2 diabetes mellitus with diabetic neuropathy, unspecified: Secondary | ICD-10-CM | POA: Insufficient documentation

## 2023-11-06 DIAGNOSIS — D122 Benign neoplasm of ascending colon: Secondary | ICD-10-CM | POA: Insufficient documentation

## 2023-11-06 DIAGNOSIS — D123 Benign neoplasm of transverse colon: Secondary | ICD-10-CM | POA: Insufficient documentation

## 2023-11-06 DIAGNOSIS — F32A Depression, unspecified: Secondary | ICD-10-CM | POA: Diagnosis not present

## 2023-11-06 DIAGNOSIS — I10 Essential (primary) hypertension: Secondary | ICD-10-CM | POA: Insufficient documentation

## 2023-11-06 DIAGNOSIS — Z955 Presence of coronary angioplasty implant and graft: Secondary | ICD-10-CM | POA: Insufficient documentation

## 2023-11-06 DIAGNOSIS — Z87891 Personal history of nicotine dependence: Secondary | ICD-10-CM | POA: Diagnosis not present

## 2023-11-06 DIAGNOSIS — G4730 Sleep apnea, unspecified: Secondary | ICD-10-CM | POA: Diagnosis not present

## 2023-11-06 DIAGNOSIS — D509 Iron deficiency anemia, unspecified: Secondary | ICD-10-CM | POA: Insufficient documentation

## 2023-11-06 DIAGNOSIS — K642 Third degree hemorrhoids: Secondary | ICD-10-CM | POA: Insufficient documentation

## 2023-11-06 DIAGNOSIS — Z7984 Long term (current) use of oral hypoglycemic drugs: Secondary | ICD-10-CM | POA: Insufficient documentation

## 2023-11-06 DIAGNOSIS — I252 Old myocardial infarction: Secondary | ICD-10-CM | POA: Diagnosis not present

## 2023-11-06 DIAGNOSIS — D125 Benign neoplasm of sigmoid colon: Secondary | ICD-10-CM | POA: Diagnosis not present

## 2023-11-06 DIAGNOSIS — K2960 Other gastritis without bleeding: Secondary | ICD-10-CM | POA: Insufficient documentation

## 2023-11-06 DIAGNOSIS — K219 Gastro-esophageal reflux disease without esophagitis: Secondary | ICD-10-CM | POA: Diagnosis not present

## 2023-11-06 DIAGNOSIS — Z6833 Body mass index (BMI) 33.0-33.9, adult: Secondary | ICD-10-CM | POA: Insufficient documentation

## 2023-11-06 DIAGNOSIS — E669 Obesity, unspecified: Secondary | ICD-10-CM | POA: Insufficient documentation

## 2023-11-06 DIAGNOSIS — Z85038 Personal history of other malignant neoplasm of large intestine: Secondary | ICD-10-CM | POA: Insufficient documentation

## 2023-11-06 DIAGNOSIS — F419 Anxiety disorder, unspecified: Secondary | ICD-10-CM | POA: Insufficient documentation

## 2023-11-06 DIAGNOSIS — Z951 Presence of aortocoronary bypass graft: Secondary | ICD-10-CM | POA: Diagnosis not present

## 2023-11-06 HISTORY — PX: COLONOSCOPY WITH PROPOFOL: SHX5780

## 2023-11-06 HISTORY — PX: POLYPECTOMY: SHX5525

## 2023-11-06 HISTORY — DX: Ventricular premature depolarization: I49.3

## 2023-11-06 HISTORY — PX: ESOPHAGOGASTRODUODENOSCOPY (EGD) WITH PROPOFOL: SHX5813

## 2023-11-06 HISTORY — DX: Mixed hyperlipidemia: E78.2

## 2023-11-06 HISTORY — DX: Spinal stenosis, lumbar region with neurogenic claudication: M48.062

## 2023-11-06 HISTORY — DX: Personal history of adenomatous and serrated colon polyps: Z86.0101

## 2023-11-06 HISTORY — PX: BIOPSY: SHX5522

## 2023-11-06 HISTORY — DX: Dyspnea, unspecified: R06.00

## 2023-11-06 LAB — GLUCOSE, CAPILLARY: Glucose-Capillary: 131 mg/dL — ABNORMAL HIGH (ref 70–99)

## 2023-11-06 SURGERY — COLONOSCOPY WITH PROPOFOL
Anesthesia: General

## 2023-11-06 MED ORDER — PROPOFOL 10 MG/ML IV BOLUS
INTRAVENOUS | Status: DC | PRN
  Administered 2023-11-06: 50 mg via INTRAVENOUS

## 2023-11-06 MED ORDER — PROPOFOL 1000 MG/100ML IV EMUL
INTRAVENOUS | Status: AC
  Filled 2023-11-06: qty 100

## 2023-11-06 MED ORDER — SODIUM CHLORIDE 0.9 % IV SOLN: INTRAVENOUS | Status: DC

## 2023-11-06 MED ORDER — LIDOCAINE HCL (CARDIAC) PF 100 MG/5ML IV SOSY
PREFILLED_SYRINGE | INTRAVENOUS | Status: DC | PRN
  Administered 2023-11-06: 100 mg via INTRAVENOUS

## 2023-11-06 MED ORDER — LIDOCAINE HCL (PF) 2 % IJ SOLN
INTRAMUSCULAR | Status: AC
  Filled 2023-11-06: qty 5

## 2023-11-06 MED ORDER — PROPOFOL 500 MG/50ML IV EMUL
INTRAVENOUS | Status: DC | PRN
  Administered 2023-11-06: 150 ug/kg/min via INTRAVENOUS

## 2023-11-06 NOTE — Transfer of Care (Signed)
 Immediate Anesthesia Transfer of Care Note  Patient: Kent [name]  Procedure(s) Performed: COLONOSCOPY WITH PROPOFOL  ESOPHAGOGASTRODUODENOSCOPY (EGD) WITH PROPOFOL  BIOPSY POLYPECTOMY2 2 Patient Location: PACU  Anesthesia Type:General  Level of Consciousness: awake and sedated  Airway & Oxygen Therapy: Patient Spontanous Breathing and Patient connected to face mask oxygen  Post-op Assessment: Report given to RN and Post -op Vital signs reviewed and stable  Post vital signs: Reviewed and stable  Last Vitals:  Vitals Value Taken Time  BP    Temp    Pulse    Resp    SpO2      Last Pain:  Vitals:   11/06/23 0752  TempSrc: Temporal  PainSc: 0-No pain         Complications: There were no known notable events for this encounter.

## 2023-11-06 NOTE — Interval H&P Note (Signed)
 History and Physical Interval Note: Preprocedure H&P from 11/06/23  was reviewed and there was no interval change after seeing and examining the patient.  Written consent was obtained from the patient after discussion of risks, benefits, and alternatives. Patient has consented to proceed with Esophagogastroduodenoscopy and Colonoscopy with possible intervention   11/06/2023 8:46 AM  Kent [name]  has presented today for surgery, with the diagnosis of V12.72 (ICD-9-CM) - Z86.010 (ICD-10-CM) - History of adenomatous polyp of colon 280.9 (ICD-9-CM) - D50.9 (ICD-10-CM) - Iron deficiency anemia, unspecified iron deficiency anemia type.  The various methods of treatment have been discussed with the patient and family. After consideration of risks, benefits and other options for treatment, the patient has consented to  Procedure(s): COLONOSCOPY WITH PROPOFOL  (N/A) ESOPHAGOGASTRODUODENOSCOPY (EGD) WITH PROPOFOL  (N/A) as a surgical intervention.  The patient's history has been reviewed, patient examined, no change in status, stable for surgery.  I have reviewed the patient's chart and labs.  Questions were answered to the patient's satisfaction.     Elspeth Ozell Jungling

## 2023-11-06 NOTE — Anesthesia Preprocedure Evaluation (Addendum)
 Anesthesia Evaluation  Patient identified by MRN, date of birth, ID band Patient awake    Reviewed: Allergy & Precautions, NPO status , Patient's Chart, lab work & pertinent test results  History of Anesthesia Complications Negative for: history of anesthetic complications  Airway Mallampati: II   Neck ROM: Full    Dental  (+) Missing   Pulmonary sleep apnea and Continuous Positive Airway Pressure Ventilation , former smoker (quit 1995)   Pulmonary exam normal breath sounds clear to auscultation       Cardiovascular hypertension, + CAD (s/p MI, CABG, stents on Plavix )  Normal cardiovascular exam Rhythm:Regular Rate:Normal  Myocardial perfusion 07/18/23:  1.  Normal left ventricular function  2.  No regional wall motion abnormalities  3.  Mild inferior ischemia   Echo 07/18/23:  NORMAL LEFT VENTRICULAR SYSTOLIC FUNCTION  NORMAL RIGHT VENTRICULAR SYSTOLIC FUNCTION  MILD VALVULAR REGURGITATION  NO VALVULAR STENOSIS  LA MILDLY ENLARGED  MILD TR, PR  EF >55%    Neuro/Psych  PSYCHIATRIC DISORDERS Anxiety Depression     Neuromuscular disease (neuropathy)    GI/Hepatic ,GERD  ,,  Endo/Other  diabetes, Type 2  Obesity   Renal/GU      Musculoskeletal   Abdominal   Peds  Hematology  (+) Blood dyscrasia, anemia   Anesthesia Other Findings Cardiology note 07/25/23:  68 year old gentleman with known coronary disease, status post CABG, status post multiple coronary stents, with non-STEMI with successful DES to mid OM1 04/2019, with occasional episodes of exertional chest pain, associate with shortness of breath.. Patient has essential hypertension, blood pressure well controlled on current BP medications. 2D echocardiogram 07/18/2023 revealed normal left ventricular function, with LVEF greater than 55%. Lexiscan Myoview 07/18/2023 revealed mild inferior wall ischemia, consistent with his known coronary anatomy as described above. We  discussed the risk, benefits alternatives of conservative management versus seeding with cardiac catheterization and have elected to continue current therapy.   Plan   1. Continue current medications 2. Continue DAPT uninterrupted for one year 3. Counseled patient about low-sodium diet 4. DASH diet printed instructions given to the patient 5. Counseled patient about low-cholesterol diet 6. Continue simvastatin  for hyperlipidemia management 7. Low-cholesterol, low-fat diet instructions given to the patient 8. Defer cardiac catheterization 9. Return to clinic for follow-up in 4 months  No orders of the defined types were placed in this encounter.  Return in about 4 months (around 11/24/2023).    Reproductive/Obstetrics                             Anesthesia Physical Anesthesia Plan  ASA: 3  Anesthesia Plan: General   Post-op Pain Management:    Induction: Intravenous  PONV Risk Score and Plan: 2 and Propofol  infusion, TIVA and Treatment may vary due to age or medical condition  Airway Management Planned: Natural Airway  Additional Equipment:   Intra-op Plan:   Post-operative Plan:   Informed Consent: I have reviewed the patients History and Physical, chart, labs and discussed the procedure including the risks, benefits and alternatives for the proposed anesthesia with the patient or authorized representative who has indicated his/her understanding and acceptance.       Plan Discussed with: CRNA  Anesthesia Plan Comments: (LMA/GETA backup discussed.  Patient consented for risks of anesthesia including but not limited to:  - adverse reactions to medications - damage to eyes, teeth, lips or other oral mucosa - nerve damage due to positioning  - sore throat  or hoarseness - damage to heart, brain, nerves, lungs, other parts of body or loss of life  Informed patient about role of CRNA in peri- and intra-operative care.  Patient voiced  understanding.)        Anesthesia Quick Evaluation

## 2023-11-06 NOTE — H&P (Signed)
 Pre-Procedure H&P   Patient ID: Kent [name] is a 68 y.o. male.  Gastroenterology Provider: Elspeth Ozell Jungling, DO  Referring Provider: Luke Barefoot, NP PCP: Cleotilde Oneil FALCON, MD  Date: 11/06/2023  HPI [name] is a 68 y.o. male who presents today for Esophagogastroduodenoscopy and Colonoscopy for Esophagogastroduodenoscopy and Colonoscopy with possible intervention .  Patient last underwent colonoscopy in October 2013 with hyperplastic polyps.  Sigmoid diverticulosis and internal hemorrhoids were also noted.  Normal colonoscopy 2006  FIT - September 2024  Patient reportedly had iron deficiency anemia, his labs currently ferritin 177 iron saturation 34% hemoglobin 13.4 MCV 88.7 platelets 109,000 B12 412 creatinine 0.8 A1c 7.6  No family history of colon cancer or colon polyps  Patient on Plavix  which has been held for this procedure   Past Medical History:  Diagnosis Date   Anemia    Anxiety    Cancer (HCC)    precancerous rectal polyps   Cardiovascular disease    Coronary artery disease    DDD (degenerative disc disease), lumbar    Depression    Diabetes mellitus (HCC)    Type II   Dyspnea    GERD (gastroesophageal reflux disease)    GI bleed    History of adenomatous polyp of colon    History of blood transfusion    Hyperlipidemia, mixed    Hypertension    Lumbar stenosis with neurogenic claudication    Myocardial infarction (HCC) 04/2019   stents x 4   Neuropathy due to secondary diabetes mellitus (HCC)    Osteoarthritis    Premature ventricular contractions    Sleep apnea    uses a cpap machine    Past Surgical History:  Procedure Laterality Date   BACK SURGERY     BILATERAL CARPAL TUNNEL RELEASE     both right and left done two different years   bilateral leg surgery  2006   Both legs crushed, total of 6 surgeries at [facility] in 2006   BYPASS GRAFT  1996   cardiac bypass done in 1996   COLON SURGERY     colorectal surgery   2000   For colorectal cancer, done by DR. ely and Dr. Rollene   CORONARY ANGIOPLASTY WITH STENT PLACEMENT  2014   CORONARY ARTERY BYPASS GRAFT  1996   CORONARY STENT INTERVENTION N/A 12/05/2017   Procedure: CORONARY STENT INTERVENTION;  Surgeon: Ammon Blunt, MD;  Location: [facility] INVASIVE CV [facility];  Service: Cardiovascular;  Laterality: N/A;   CORONARY STENT INTERVENTION N/A 04/02/2019   Procedure: CORONARY STENT INTERVENTION;  Surgeon: Ammon Blunt, MD;  Location: [facility] INVASIVE CV [facility];  Service: Cardiovascular;  Laterality: N/A;   EYE SURGERY     cataract extractions   FRACTURE SURGERY Bilateral 2006   titanium rods on both legs   LEFT HEART CATH AND CORS/GRAFTS ANGIOGRAPHY N/A 12/05/2017   Procedure: LEFT HEART CATH AND CORS/GRAFTS ANGIOGRAPHY;  Surgeon: Ammon Blunt, MD;  Location: [facility] INV[facility] CV [facility];  Service: Cardiovascular;  Laterality: N/A;   LEFT HEART CATH AND CORS/GRAFTS ANGIOGRAPHY N/A 04/02/2019   Procedure: LEFT HEART CATH AND CORS/GRAFTS ANGIOGRAPHY;  Surgeon: Ammon Blunt, MD;  Location: [facility] INV[facility] CV [facility];  Service: Cardiovascular;  Laterality: N/A;   LUMBAR LAMINECTOMY/DECOMPRESSION MICRODISCECTOMY N/A 04/08/2020   Procedure: L2-5 DECOMPRESSION;  Surgeon: Clois Fret, MD;  Location: [facility] ORS;  Service: Neurosurgery;  Laterality: N/A;    Family History No h/o GI disease or malignancy  Review of Systems  Constitutional:  Negative  for activity change, appetite change, chills, diaphoresis, fatigue, fever and unexpected weight change.  HENT:  Negative for trouble swallowing and voice change.   Respiratory:  Negative for shortness of breath and wheezing.   Cardiovascular:  Negative for chest pain, palpitations and leg swelling.  Gastrointestinal:  Negative for abdominal distention, abdominal pain, anal bleeding, blood in stool, constipation, diarrhea, nausea and vomiting.  Musculoskeletal:  Negative for arthralgias and myalgias.  Skin:   Negative for color change and pallor.  Neurological:  Negative for dizziness, syncope and weakness.  Psychiatric/Behavioral:  Negative for confusion. The patient is not nervous/anxious.   All other systems reviewed and are negative.    Medications No current facility-administered medications on file prior to encounter.   Current Outpatient Medications on File Prior to Encounter  Medication Sig Dispense Refill   acetaminophen  (TYLENOL ) 500 MG tablet Take 500 mg by mouth every 6 (six) hours as needed for mild pain (pain score 1-3).     ALPRAZolam  (XANAX ) 0.25 MG tablet Take 0.125 mg by mouth 3 (three) times daily as needed for anxiety.     atorvastatin  (LIPITOR) 80 MG tablet Take 1 tablet (80 mg total) by mouth daily. (Patient taking differently: Take 40 mg by mouth daily.) 30 tablet 11   carvedilol  (COREG ) 25 MG tablet Take 25 mg by mouth in the morning, at noon, and at bedtime.      clopidogrel  (PLAVIX ) 75 MG tablet Take 75 mg by mouth daily.     ferrous sulfate  325 (65 FE) MG EC tablet Take 325 mg by mouth 3 (three) times daily with meals.     gabapentin  (NEURONTIN ) 300 MG capsule Take 300 mg by mouth at bedtime.      HYDROcodone -acetaminophen  (NORCO) 7.5-325 MG tablet Take 1 tablet by mouth every 6 (six) hours as needed for severe pain (pain score 7-10).     isosorbide  mononitrate (IMDUR ) 30 MG 24 hr tablet Take 30 mg by mouth at bedtime.      latanoprost  (XALATAN ) 0.005 % ophthalmic solution Place 1 drop into both eyes at bedtime.     Magnesium  250 MG TABS Take 500 mg by mouth daily with lunch.      metFORMIN  (GLUCOPHAGE ) 500 MG tablet Take 500 mg by mouth 3 (three) times daily with meals.      methocarbamol  (ROBAXIN ) 750 MG tablet Take 750 mg by mouth 3 (three) times daily.     olmesartan-hydrochlorothiazide (BENICAR HCT) 20-12.5 MG tablet Take 1 tablet by mouth at bedtime.     omeprazole (PRILOSEC) 20 MG capsule Take 20 mg by mouth daily.     PARoxetine  (PAXIL ) 20 MG tablet Take 20 mg  by mouth daily.     aspirin  EC 81 MG tablet Take 81 mg by mouth at bedtime.     lisinopril  (PRINIVIL ,ZESTRIL ) 20 MG tablet Take 20 mg by mouth daily. (Patient not taking: Reported on 11/06/2023)     nitroGLYCERIN  (NITROSTAT ) 0.4 MG SL tablet Place 1 tablet (0.4 mg total) under the tongue every 5 (five) minutes as needed for chest pain. 100 tablet 3   polyethylene glycol (MIRALAX  / GLYCOLAX ) 17 g packet Take 17 g by mouth daily as needed for mild constipation. (Patient not taking: Reported on 11/06/2023) 14 each 0   senna (SENOKOT) 8.6 MG TABS tablet Take 1 tablet (8.6 mg total) by mouth 2 (two) times daily. (Patient not taking: Reported on 11/06/2023) 120 tablet 0    Pertinent medications related to GI and procedure were reviewed by  me with the patient prior to the procedure   Current Facility-Administered Medications:    0.9 %  sodium chloride  infusion, , Intravenous, Continuous, Onita Elspeth Sharper, DO, Last Rate: 20 mL/hr at 11/06/23 0802, New Bag at 11/06/23 0802  sodium chloride  20 mL/hr at 11/06/23 0802       Allergies  Allergen Reactions   Celebrex [Celecoxib] Other (See Comments)    GI bleed   Nsaids     GI Bleed.  Takes ibuprofen daily with food without problems   Oxycodone Other (See Comments)    Hallucinations and agitations Migraines occur    Prednisone Other (See Comments)    Can't take high doses.(30 mg max) Sets off cardiac irregularities   Zoloft [Sertraline Hcl] Other (See Comments)    Crazy   Skin Adhesives [Cyanoacrylate]     Causes skin to itch even if on for a short time. Paper Tape is OK   Allergies were reviewed by me prior to the procedure  Objective   Body mass index is 33.53 kg/m. Vitals:   11/06/23 0752  BP: 121/81  Pulse: (!) 58  Resp: 17  Temp: (!) 97.3 F (36.3 C)  TempSrc: Temporal  SpO2: 97%  Weight: 109 kg  Height: 5' 11 (1.803 m)     Physical Exam Vitals and nursing note reviewed.  Constitutional:      General: He is not in  acute distress.    Appearance: Normal appearance. He is obese. He is not ill-appearing, toxic-appearing or diaphoretic.  HENT:     Head: Normocephalic and atraumatic.     Nose: Nose normal.     Mouth/Throat:     Mouth: Mucous membranes are moist.     Pharynx: Oropharynx is clear.  Eyes:     General: No scleral icterus.    Extraocular Movements: Extraocular movements intact.  Cardiovascular:     Rate and Rhythm: Regular rhythm. Bradycardia present.     Heart sounds: Normal heart sounds. No murmur heard.    No friction rub. No gallop.  Pulmonary:     Effort: Pulmonary effort is normal. No respiratory distress.     Breath sounds: Normal breath sounds. No wheezing, rhonchi or rales.  Abdominal:     General: Bowel sounds are normal. There is no distension.     Palpations: Abdomen is soft.     Tenderness: There is no abdominal tenderness. There is no guarding or rebound.  Musculoskeletal:     Cervical back: Neck supple.     Right lower leg: No edema.     Left lower leg: No edema.  Skin:    General: Skin is warm and dry.     Coloration: Skin is not jaundiced or pale.  Neurological:     General: No focal deficit present.     Mental Status: He is alert and oriented to person, place, and time. Mental status is at baseline.  Psychiatric:        Mood and Affect: Mood normal.        Behavior: Behavior normal.        Thought Content: Thought content normal.        Judgment: Judgment normal.      Assessment:  [name] is a 68 y.o. male  who presents today for Esophagogastroduodenoscopy and Colonoscopy for IDA, phx colon polyps.  Plan:  Esophagogastroduodenoscopy and Colonoscopy with possible intervention today  Esophagogastroduodenoscopy and Colonoscopy with possible biopsy, control of bleeding, polypectomy, and interventions as necessary has been  discussed with the patient/patient representative. Informed consent was obtained from the patient/patient representative  after explaining the indication, nature, and risks of the procedure including but not limited to death, bleeding, perforation, missed neoplasm/lesions, cardiorespiratory compromise, and reaction to medications. Opportunity for questions was given and appropriate answers were provided. Patient/patient representative has verbalized understanding is amenable to undergoing the procedure.   Elspeth Ozell Jungling, DO  [facility]  Portions of the record may have been created with voice recognition software. Occasional wrong-word or 'sound-a-like' substitutions may have occurred due to the inherent limitations of voice recognition software.  Read the chart carefully and recognize, using context, where substitutions may have occurred.

## 2023-11-06 NOTE — Op Note (Addendum)
 [facility] Gastroenterology Patient Name: Kent [name] Procedure Date: 11/06/2023 8:38 AM MRN: [id number] Account #: [id number] Date of Birth: June 28, 1956 Admit Type: Outpatient Age: 68 Room: [facility] ENDO ROOM 2 Gender: Male Note Status: Supervisor Override Instrument Name: [id number] Procedure:             Colonoscopy Indications:           Iron deficiency anemia, History of Adenomatous polyps                         in the colon Providers:             Elspeth Ozell Onita ROSALEA, DO Referring MD:          Oneil PHEBE Pinal, MD (Referring MD) Medicines:             Monitored Anesthesia Care Complications:         No immediate complications. Estimated blood loss:                         Minimal. Procedure:             Pre-Anesthesia Assessment:                        - Prior to the procedure, a History and Physical was                         performed, and patient medications and allergies were                         reviewed. The patient is competent. The risks and                         benefits of the procedure and the sedation options and                         risks were discussed with the patient. All questions                         were answered and informed consent was obtained.                         Patient identification and proposed procedure were                         verified by the physician, the nurse, the anesthetist                         and the technician in the endoscopy suite. Mental                         Status Examination: alert and oriented. Airway                         Examination: normal oropharyngeal airway and neck                         mobility. Respiratory Examination: clear to  auscultation. CV Examination: bradycardia noted.                         Prophylactic Antibiotics: The patient does not require                         prophylactic antibiotics. Prior Anticoagulants: The                          patient has taken Plavix  (clopidogrel ), last dose was                         5 days prior to procedure. ASA Grade Assessment: III -                         A patient with severe systemic disease. After                         reviewing the risks and benefits, the patient was                         deemed in satisfactory condition to undergo the                         procedure. The anesthesia plan was to use monitored                         anesthesia care (MAC). Immediately prior to                         administration of medications, the patient was                         re-assessed for adequacy to receive sedatives. The                         heart rate, respiratory rate, oxygen saturations,                         blood pressure, adequacy of pulmonary ventilation, and                         response to care were monitored throughout the                         procedure. The physical status of the patient was                         re-assessed after the procedure.                        After obtaining informed consent, the colonoscope was                         passed under direct vision. Throughout the procedure,                         the patient's blood pressure, pulse, and oxygen  saturations were monitored continuously. The                         Colonoscope was introduced through the anus and                         advanced to the the cecum, identified by appendiceal                         orifice and ileocecal valve. The colonoscopy was                         performed without difficulty. The patient tolerated                         the procedure well. The quality of the bowel                         preparation was evaluated using the BBPS [facility] Bowel                         Preparation Scale) with scores of: Right Colon = 3                         (entire mucosa seen well with no residual staining,                         small  fragments of stool or opaque liquid), Transverse                         Colon = 3 (entire mucosa seen well with no residual                         staining, small fragments of stool or opaque liquid)                         and Left Colon = 2 (minor amount of residual staining,                         small fragments of stool and/or opaque liquid, but                         mucosa seen well). The total BBPS score equals 8. The                         quality of the bowel preparation was excellent. The                         ileocecal valve, appendiceal orifice, and rectum were                         photographed. Findings:      Hemorrhoids were found on perianal exam.      Non-bleeding internal hemorrhoids were found during retroflexion and       during perianal exam. The hemorrhoids were Grade III (internal       hemorrhoids that prolapse but require manual reduction). Estimated blood  loss: none.      Four sessile polyps were found in the sigmoid colon (1), transverse       colon (1) and ascending colon (2). The polyps were 1 to 3 mm in size.       These polyps were removed with a jumbo cold forceps. Resection and       retrieval were complete. Estimated blood loss was minimal.      The exam was otherwise without abnormality on direct and retroflexion       views. Impression:            - Hemorrhoids found on perianal exam.                        - Non-bleeding internal hemorrhoids.                        - Four 1 to 3 mm polyps in the sigmoid colon, in the                         transverse colon and in the ascending colon, removed                         with a jumbo cold forceps. Resected and retrieved.                        - The examination was otherwise normal on direct and                         retroflexion views. Recommendation:        - Patient has a contact number available for                         emergencies. The signs and symptoms of potential                          delayed complications were discussed with the patient.                         Return to normal activities tomorrow. Written                         discharge instructions were provided to the patient.                        - Discharge patient to home.                        - Resume previous diet.                        - Continue present medications.                        - No ibuprofen, naproxen, or other non-steroidal                         anti-inflammatory drugs for 5 days after polyp removal.                        -  Resume Plavix  (clopidogrel ) at prior dose in 2 days.                         Refer to managing physician for further adjustment of                         therapy.                        - Repeat colonoscopy for surveillance based on                         pathology results.                        - Return to GI office as previously scheduled.                        - The findings and recommendations were discussed with                         the patient. Procedure Code(s):     --- Professional ---                        [phone], Colonoscopy, flexible; with biopsy, single or                         multiple Diagnosis Code(s):     --- Professional ---                        D12.5, Benign neoplasm of sigmoid colon                        D12.3, Benign neoplasm of transverse colon (hepatic                         flexure or splenic flexure)                        D12.2, Benign neoplasm of ascending colon                        K64.2, Third degree hemorrhoids                        D50.9, Iron deficiency anemia, unspecified CPT copyright 2022 American [employer]. All rights reserved. The codes documented in this report are preliminary and upon coder review may  be revised to meet current compliance requirements. Attending Participation:      I personally performed the entire procedure. Elspeth Jungling, DO Elspeth Ozell Jungling DO, DO 11/06/2023 9:39:06  AM This report has been signed electronically. Number of Addenda: 0 Note Initiated On: 11/06/2023 8:38 AM Scope Withdrawal Time: 0 hours 18 minutes 13 seconds  Total Procedure Duration: 0 hours 24 minutes 31 seconds  Estimated Blood Loss:  Estimated blood loss was minimal.      [facility]

## 2023-11-06 NOTE — Op Note (Signed)
 [facility] Gastroenterology Patient Name: Kent [name] Procedure Date: 11/06/2023 8:39 AM MRN: [id number] Account #: [id number] Date of Birth: 08-Apr-1956 Admit Type: Outpatient Age: 68 Room: [facility] ENDO ROOM 2 Gender: Male Note Status: Finalized Instrument Name: Upper Endoscope [id number] Procedure:             Upper GI endoscopy Indications:           Iron deficiency anemia Providers:             Elspeth Ozell Onita ROSALEA, DO Referring MD:          Oneil PHEBE Pinal, MD (Referring MD) Medicines:             Monitored Anesthesia Care Complications:         No immediate complications. Estimated blood loss:                         Minimal. Procedure:             Pre-Anesthesia Assessment:                        - Prior to the procedure, a History and Physical was                         performed, and patient medications and allergies were                         reviewed. The patient is competent. The risks and                         benefits of the procedure and the sedation options and                         risks were discussed with the patient. All questions                         were answered and informed consent was obtained.                         Patient identification and proposed procedure were                         verified by the physician, the nurse, the anesthetist                         and the technician in the endoscopy suite. Mental                         Status Examination: alert and oriented. Airway                         Examination: normal oropharyngeal airway and neck                         mobility. Respiratory Examination: clear to                         auscultation. CV Examination: bradycardia noted.  Prophylactic Antibiotics: The patient does not require                         prophylactic antibiotics. Prior Anticoagulants: The                         patient has taken Plavix  (clopidogrel ), last dose was                          5 days prior to procedure. ASA Grade Assessment: III -                         A patient with severe systemic disease. After                         reviewing the risks and benefits, the patient was                         deemed in satisfactory condition to undergo the                         procedure. The anesthesia plan was to use monitored                         anesthesia care (MAC). Immediately prior to                         administration of medications, the patient was                         re-assessed for adequacy to receive sedatives. The                         heart rate, respiratory rate, oxygen saturations,                         blood pressure, adequacy of pulmonary ventilation, and                         response to care were monitored throughout the                         procedure. The physical status of the patient was                         re-assessed after the procedure.                        After obtaining informed consent, the endoscope was                         passed under direct vision. Throughout the procedure,                         the patient's blood pressure, pulse, and oxygen                         saturations were monitored continuously. The Endoscope  was introduced through the mouth, and advanced to the                         second part of duodenum. The upper GI endoscopy was                         accomplished without difficulty. The patient tolerated                         the procedure well. Findings:      The duodenal bulb, first portion of the duodenum and second portion of       the duodenum were normal. Estimated blood loss: none.      Localized mild inflammation characterized by erythema and granularity       was found on the lesser curvature of the stomach. Biopsies were taken       with a cold forceps for histology. Estimated blood loss was minimal.      The exam of the stomach was  otherwise normal.      The Z-line was regular. Estimated blood loss: none. Imaging was       performed using white light and narrow band imaging to visualize the       mucosa. Estimated blood loss: none.      Esophagogastric landmarks were identified: the gastroesophageal junction       was found at 40 cm from the incisors.      The examined esophagus was normal. Estimated blood loss: none. Impression:            - Normal duodenal bulb, first portion of the duodenum                         and second portion of the duodenum.                        - Gastritis. Biopsied.                        - Z-line regular.                        - Esophagogastric landmarks identified.                        - Normal esophagus. Recommendation:        - Patient has a contact number available for                         emergencies. The signs and symptoms of potential                         delayed complications were discussed with the patient.                         Return to normal activities tomorrow. Written                         discharge instructions were provided to the patient.                        - Discharge patient to home.                        -  Resume previous diet.                        - Continue present medications.                        - Await pathology results.                        - Return to GI clinic as previously scheduled.                        - proceed with colonoscopy. see report for further                         recommendations.                        - The findings and recommendations were discussed with                         the patient. Procedure Code(s):     --- Professional ---                        [phone], Esophagogastroduodenoscopy, flexible,                         transoral; with biopsy, single or multiple Diagnosis Code(s):     --- Professional ---                        K29.70, Gastritis, unspecified, without bleeding                         D50.9, Iron deficiency anemia, unspecified CPT copyright 2022 American Medical Association. All rights reserved. The codes documented in this report are preliminary and upon coder review may  be revised to meet current compliance requirements. Attending Participation:      I personally performed the entire procedure. Elspeth Jungling, DO Elspeth Ozell Jungling DO, DO 11/06/2023 9:07:20 AM This report has been signed electronically. Number of Addenda: 0 Note Initiated On: 11/06/2023 8:39 AM Estimated Blood Loss:  Estimated blood loss was minimal.      [facility]

## 2023-11-07 ENCOUNTER — Encounter: Payer: Self-pay | Admitting: Gastroenterology

## 2023-11-07 LAB — SURGICAL PATHOLOGY

## 2023-11-07 NOTE — Anesthesia Postprocedure Evaluation (Signed)
 Anesthesia Post Note  Patient: Kent [name]  Procedure(s) Performed: COLONOSCOPY WITH PROPOFOL  ESOPHAGOGASTRODUODENOSCOPY (EGD) WITH PROPOFOL  BIOPSY POLYPECTOMY  Patient location during evaluation: PACU Anesthesia Type: General Level of consciousness: awake and alert, oriented and patient cooperative Pain management: pain level controlled Vital Signs Assessment: post-procedure vital signs reviewed and stable Respiratory status: spontaneous breathing, nonlabored ventilation and respiratory function stable Cardiovascular status: blood pressure returned to baseline and stable Postop Assessment: adequate PO intake Anesthetic complications: no   There were no known notable events for this encounter.   Last Vitals:  Vitals:   11/06/23 0948 11/06/23 0958  BP: 129/77 120/83  Pulse: 68 65  Resp: 12 17  Temp:    SpO2: 97% 99%    Last Pain:  Vitals:   11/06/23 0958  TempSrc:   PainSc: 0-No pain                 Alfonso Ruths

## 2023-12-05 ENCOUNTER — Other Ambulatory Visit: Payer: Self-pay | Admitting: Family Medicine

## 2023-12-05 DIAGNOSIS — M5412 Radiculopathy, cervical region: Secondary | ICD-10-CM

## 2023-12-25 NOTE — Discharge Instructions (Signed)

## 2023-12-26 ENCOUNTER — Ambulatory Visit
Admission: RE | Admit: 2023-12-26 | Discharge: 2023-12-26 | Disposition: A | Discharge status: Home / Self Care | Payer: Medicare Other | Source: Ambulatory Visit | Attending: Family Medicine | Admitting: Family Medicine

## 2023-12-26 DIAGNOSIS — M5412 Radiculopathy, cervical region: Secondary | ICD-10-CM

## 2023-12-26 MED ORDER — IOPAMIDOL (ISOVUE-M 300) INJECTION 61%
1.0000 mL | Freq: Once | INTRAMUSCULAR | Status: AC
  Administered 2023-12-26: 1 mL via EPIDURAL

## 2023-12-26 MED ORDER — TRIAMCINOLONE ACETONIDE 40 MG/ML IJ SUSP (RADIOLOGY)
60.0000 mg | Freq: Once | INTRAMUSCULAR | Status: AC
Start: 2023-12-26 — End: 2023-12-26
  Administered 2023-12-26: 60 mg via EPIDURAL

## 2024-11-11 ENCOUNTER — Other Ambulatory Visit: Payer: Self-pay | Admitting: Family Medicine

## 2024-11-11 ENCOUNTER — Ambulatory Visit
Admission: RE | Admit: 2024-11-11 | Discharge: 2024-11-11 | Disposition: A | Discharge status: Home / Self Care | Source: Ambulatory Visit | Attending: Family Medicine | Admitting: Family Medicine

## 2024-11-11 DIAGNOSIS — M75102 Unspecified rotator cuff tear or rupture of left shoulder, not specified as traumatic: Secondary | ICD-10-CM

## 2024-12-05 ENCOUNTER — Other Ambulatory Visit: Payer: Self-pay | Admitting: Orthopedic Surgery

## 2024-12-20 ENCOUNTER — Ambulatory Visit: Admit: 2024-12-20 | Admitting: Orthopedic Surgery
